# Patient Record
Sex: Male | Born: 1965 | Race: White | Hispanic: No | Marital: Married | Smoking: Current every day smoker
Health system: Southern US, Community
[De-identification: ages and names within clinical notes are randomized; demographics above are authoritative.]

## PROBLEM LIST (undated history)

## (undated) DIAGNOSIS — L309 Dermatitis, unspecified: Secondary | ICD-10-CM

## (undated) DIAGNOSIS — I219 Acute myocardial infarction, unspecified: Secondary | ICD-10-CM

## (undated) DIAGNOSIS — I2510 Atherosclerotic heart disease of native coronary artery without angina pectoris: Secondary | ICD-10-CM

## (undated) DIAGNOSIS — M1990 Unspecified osteoarthritis, unspecified site: Secondary | ICD-10-CM

## (undated) DIAGNOSIS — K529 Noninfective gastroenteritis and colitis, unspecified: Secondary | ICD-10-CM

## (undated) DIAGNOSIS — R809 Proteinuria, unspecified: Secondary | ICD-10-CM

## (undated) DIAGNOSIS — F988 Other specified behavioral and emotional disorders with onset usually occurring in childhood and adolescence: Secondary | ICD-10-CM

## (undated) DIAGNOSIS — T7840XA Allergy, unspecified, initial encounter: Secondary | ICD-10-CM

## (undated) DIAGNOSIS — J439 Emphysema, unspecified: Secondary | ICD-10-CM

## (undated) DIAGNOSIS — K37 Unspecified appendicitis: Secondary | ICD-10-CM

## (undated) DIAGNOSIS — K219 Gastro-esophageal reflux disease without esophagitis: Secondary | ICD-10-CM

## (undated) DIAGNOSIS — K5090 Crohn's disease, unspecified, without complications: Secondary | ICD-10-CM

## (undated) DIAGNOSIS — I10 Essential (primary) hypertension: Secondary | ICD-10-CM

## (undated) DIAGNOSIS — N2889 Other specified disorders of kidney and ureter: Secondary | ICD-10-CM

## (undated) DIAGNOSIS — E785 Hyperlipidemia, unspecified: Secondary | ICD-10-CM

## (undated) DIAGNOSIS — C801 Malignant (primary) neoplasm, unspecified: Secondary | ICD-10-CM

## (undated) HISTORY — DX: Allergy, unspecified, initial encounter: T78.40XA

## (undated) HISTORY — PX: APPENDECTOMY: SHX54

## (undated) HISTORY — DX: Proteinuria, unspecified: R80.9

## (undated) HISTORY — DX: Other specified behavioral and emotional disorders with onset usually occurring in childhood and adolescence: F98.8

## (undated) HISTORY — DX: Hyperlipidemia, unspecified: E78.5

## (undated) HISTORY — DX: Malignant (primary) neoplasm, unspecified: C80.1

## (undated) HISTORY — DX: Crohn's disease, unspecified, without complications: K50.90

## (undated) HISTORY — PX: CORONARY STENT PLACEMENT: SHX1402

## (undated) HISTORY — DX: Unspecified osteoarthritis, unspecified site: M19.90

## (undated) HISTORY — PX: COLON SURGERY: SHX602

## (undated) HISTORY — DX: Essential (primary) hypertension: I10

## (undated) HISTORY — DX: Noninfective gastroenteritis and colitis, unspecified: K52.9

## (undated) HISTORY — DX: Morbid (severe) obesity due to excess calories: E66.01

## (undated) HISTORY — DX: Emphysema, unspecified: J43.9

## (undated) HISTORY — DX: Unspecified appendicitis: K37

## (undated) HISTORY — DX: Atherosclerotic heart disease of native coronary artery without angina pectoris: I25.10

## (undated) HISTORY — DX: Acute myocardial infarction, unspecified: I21.9

## (undated) HISTORY — PX: ELBOW SURGERY: SHX618

---

## 2009-02-07 ENCOUNTER — Emergency Department (HOSPITAL_COMMUNITY): Admission: EM | Admit: 2009-02-07 | Discharge: 2009-02-07 | Payer: Self-pay | Admitting: Emergency Medicine

## 2013-04-11 ENCOUNTER — Other Ambulatory Visit: Payer: Self-pay | Admitting: Physician Assistant

## 2013-04-11 MED ORDER — AMPHETAMINE-DEXTROAMPHETAMINE 20 MG PO TABS: 20.0000 mg | ORAL_TABLET | Freq: Two times a day (BID) | ORAL | Status: DC

## 2013-04-23 ENCOUNTER — Encounter: Payer: Self-pay | Admitting: Internal Medicine

## 2013-04-23 DIAGNOSIS — F988 Other specified behavioral and emotional disorders with onset usually occurring in childhood and adolescence: Secondary | ICD-10-CM | POA: Insufficient documentation

## 2013-04-23 DIAGNOSIS — I2510 Atherosclerotic heart disease of native coronary artery without angina pectoris: Secondary | ICD-10-CM | POA: Insufficient documentation

## 2013-04-23 DIAGNOSIS — I10 Essential (primary) hypertension: Secondary | ICD-10-CM | POA: Insufficient documentation

## 2013-04-23 DIAGNOSIS — E785 Hyperlipidemia, unspecified: Secondary | ICD-10-CM | POA: Insufficient documentation

## 2013-04-25 ENCOUNTER — Ambulatory Visit: Payer: Self-pay | Admitting: Physician Assistant

## 2013-05-16 ENCOUNTER — Other Ambulatory Visit: Payer: Self-pay | Admitting: Physician Assistant

## 2013-05-16 MED ORDER — AMPHETAMINE-DEXTROAMPHETAMINE 20 MG PO TABS: 20.0000 mg | ORAL_TABLET | Freq: Two times a day (BID) | ORAL | Status: DC

## 2013-07-01 ENCOUNTER — Ambulatory Visit (INDEPENDENT_AMBULATORY_CARE_PROVIDER_SITE_OTHER): Payer: Self-pay | Admitting: Emergency Medicine

## 2013-07-01 ENCOUNTER — Encounter: Payer: Self-pay | Admitting: Emergency Medicine

## 2013-07-01 VITALS — BP 114/82 | HR 62 | Temp 98.2°F | Resp 18 | Ht 69.5 in | Wt 228.0 lb

## 2013-07-01 DIAGNOSIS — F988 Other specified behavioral and emotional disorders with onset usually occurring in childhood and adolescence: Secondary | ICD-10-CM

## 2013-07-01 DIAGNOSIS — I10 Essential (primary) hypertension: Secondary | ICD-10-CM

## 2013-07-01 DIAGNOSIS — J329 Chronic sinusitis, unspecified: Secondary | ICD-10-CM

## 2013-07-01 DIAGNOSIS — E782 Mixed hyperlipidemia: Secondary | ICD-10-CM

## 2013-07-01 LAB — CBC WITH DIFFERENTIAL/PLATELET
BASOS ABS: 0 10*3/uL (ref 0.0–0.1)
BASOS PCT: 1 % (ref 0–1)
EOS ABS: 0.3 10*3/uL (ref 0.0–0.7)
Eosinophils Relative: 3 % (ref 0–5)
HCT: 42.5 % (ref 39.0–52.0)
Hemoglobin: 15 g/dL (ref 13.0–17.0)
Lymphocytes Relative: 21 % (ref 12–46)
Lymphs Abs: 1.8 10*3/uL (ref 0.7–4.0)
MCH: 30.8 pg (ref 26.0–34.0)
MCHC: 35.3 g/dL (ref 30.0–36.0)
MCV: 87.3 fL (ref 78.0–100.0)
Monocytes Absolute: 0.6 10*3/uL (ref 0.1–1.0)
Monocytes Relative: 7 % (ref 3–12)
NEUTROS PCT: 68 % (ref 43–77)
Neutro Abs: 6 10*3/uL (ref 1.7–7.7)
PLATELETS: 279 10*3/uL (ref 150–400)
RBC: 4.87 MIL/uL (ref 4.22–5.81)
RDW: 15.1 % (ref 11.5–15.5)
WBC: 8.7 10*3/uL (ref 4.0–10.5)

## 2013-07-01 LAB — COMPREHENSIVE METABOLIC PANEL
ALBUMIN: 3.7 g/dL (ref 3.5–5.2)
ALK PHOS: 108 U/L (ref 39–117)
ALT: 23 U/L (ref 0–53)
AST: 16 U/L (ref 0–37)
BUN: 12 mg/dL (ref 6–23)
CO2: 26 mEq/L (ref 19–32)
Calcium: 9.4 mg/dL (ref 8.4–10.5)
Chloride: 107 mEq/L (ref 96–112)
Creat: 0.9 mg/dL (ref 0.50–1.35)
GLUCOSE: 79 mg/dL (ref 70–99)
POTASSIUM: 4.4 meq/L (ref 3.5–5.3)
SODIUM: 138 meq/L (ref 135–145)
TOTAL PROTEIN: 6.5 g/dL (ref 6.0–8.3)
Total Bilirubin: 0.3 mg/dL (ref 0.2–1.2)

## 2013-07-01 LAB — LIPID PANEL
Cholesterol: 143 mg/dL (ref 0–200)
HDL: 35 mg/dL — ABNORMAL LOW (ref >39)
LDL Cholesterol: 67 mg/dL (ref 0–99)
Total CHOL/HDL Ratio: 4.1 Ratio
Triglycerides: 206 mg/dL — ABNORMAL HIGH (ref <150)
VLDL: 41 mg/dL — ABNORMAL HIGH (ref 0–40)

## 2013-07-01 MED ORDER — METOPROLOL TARTRATE 50 MG PO TABS: 50.0000 mg | ORAL_TABLET | Freq: Two times a day (BID) | ORAL | Status: DC

## 2013-07-01 MED ORDER — SULFAMETHOXAZOLE-TMP DS 800-160 MG PO TABS: 1.0000 | ORAL_TABLET | Freq: Two times a day (BID) | ORAL | Status: DC

## 2013-07-01 MED ORDER — AMPHETAMINE-DEXTROAMPHETAMINE 20 MG PO TABS: 20.0000 mg | ORAL_TABLET | Freq: Two times a day (BID) | ORAL | Status: DC

## 2013-07-01 MED ORDER — PREDNISONE 10 MG PO TABS: ORAL_TABLET | ORAL | Status: DC

## 2013-07-01 MED ORDER — LOSARTAN POTASSIUM 100 MG PO TABS: 100.0000 mg | ORAL_TABLET | Freq: Every day | ORAL | Status: DC

## 2013-07-01 MED ORDER — ATORVASTATIN CALCIUM 80 MG PO TABS: 80.0000 mg | ORAL_TABLET | Freq: Every day | ORAL | Status: DC

## 2013-07-01 NOTE — Progress Notes (Signed)
Subjective:    Patient ID: Cole [name], male    DOB: 10-01-1965, 48 y.o.   MRN: [id number]  HPI Comments: 48 yo male presents for 3 month F/U for HTN, Cholesterol, D. Deficient. 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 He is not exercising routinely. He occasionally check BP and notes it has been good.  He needs Adderall refilled and notes he does not take it every day and Only BID rarely. He nots it helps with focus.  He has been traveling a lot with work and has noticed increased production with sinus with color on/off x 1 month. He has chronic allergy drainage. He denies any OTC relief.  Hyperlipidemia  Hypertension     Medication List       This list is accurate as of: 07/01/13 11:59 PM.  Always use your most recent med list.               amphetamine-dextroamphetamine 20 MG tablet  Commonly known as:  ADDERALL  Take 1 tablet (20 mg total) by mouth 2 (two) times daily.     aspirin 325 MG tablet  Take 325 mg by mouth daily.     atorvastatin 80 MG tablet  Commonly known as:  LIPITOR  Take 1 tablet (80 mg total) by mouth daily.     metoprolol 50 MG tablet  Commonly known as:  LOPRESSOR  Take 1 tablet (50 mg total) by mouth 2 (two) times daily.     predniSONE 10 MG tablet  Commonly known as:  DELTASONE  1 po TID x 3 days, 1 PO BID x 3 days, 1 po QD x 5 days     sulfamethoxazole-trimethoprim 800-160 MG per tablet  Commonly known as:  BACTRIM DS  Take 1 tablet by mouth 2 (two) times daily.       No Known Allergies Past Medical History  Diagnosis Date  . Hyperlipidemia   . Hypertension   . ADD (attention deficit disorder)   . ASHD (arteriosclerotic heart disease)       Review of Systems  HENT: Positive for congestion and sinus pressure.   All other systems reviewed and are negative.   BP 114/82  Pulse 62  Temp(Src) 98.2 F (36.8 C) (Temporal)  Resp 18  Ht 5' 9.5" (1.765 m)  Wt 228 lb (103.42 kg)  BMI 33.20  kg/m2     Objective:   Physical Exam  Nursing note and vitals reviewed. Constitutional: He is oriented to person, place, and time. He appears well-developed and well-nourished.  HENT:  Head: Normocephalic and atraumatic.  Right Ear: External ear normal.  Left Ear: External ear normal.  Nose: Nose normal.  Mouth/Throat: No oropharyngeal exudate.  Yellow TMs bilateral   Eyes: Conjunctivae and EOM are normal.  Neck: Normal range of motion. Neck supple. No JVD present. No thyromegaly present.  Cardiovascular: Normal rate, regular rhythm, normal heart sounds and intact distal pulses.   Pulmonary/Chest: Effort normal and breath sounds normal.  Abdominal: Soft. Bowel sounds are normal. He exhibits no distension and no mass. There is no tenderness. There is no rebound and no guarding.  Musculoskeletal: Normal range of motion. He exhibits no edema and no tenderness.  Lymphadenopathy:    He has no cervical adenopathy.  Neurological: He is alert and oriented to person, place, and time. He has normal reflexes. No cranial nerve deficit. Coordination normal.  Skin: Skin is warm and dry.  Psychiatric: He has a  normal mood and affect. His behavior is normal. Judgment and thought content normal.          Assessment & Plan:  1.  3 month F/U for HTN, Cholesterol, D. Deficient. Needs healthy diet, cardio QD and obtain healthy weight. Check Labs, Check BP if >130/80 call office 2. Sinusitis/ Allergic rhinitis- Allegra OTC, increase H2o, allergy hygiene explained. Needs 4$ RX- Bactrim DS AD, Pred DP 10 mg AD 3. ADD- Refill RX AD

## 2013-07-01 NOTE — Patient Instructions (Signed)
Allergic Rhinitis Allergic rhinitis is when the mucous membranes in the nose respond to allergens. Allergens are particles in the air that cause your body to have an allergic reaction. This causes you to release allergic antibodies. Through a chain of events, these eventually cause you to release histamine into the blood stream. Although meant to protect the body, it is this release of histamine that causes your discomfort, such as frequent sneezing, congestion, and an itchy, runny nose.  CAUSES  Seasonal allergic rhinitis (hay fever) is caused by pollen allergens that may come from grasses, trees, and weeds. Year-round allergic rhinitis (perennial allergic rhinitis) is caused by allergens such as house dust mites, pet dander, and mold spores.  SYMPTOMS   Nasal stuffiness (congestion).  Itchy, runny nose with sneezing and tearing of the eyes. DIAGNOSIS  Your health care provider can help you determine the allergen or allergens that trigger your symptoms. If you and your health care provider are unable to determine the allergen, skin or blood testing may be used. TREATMENT  Allergic Rhinitis does not have a cure, but it can be controlled by:  Medicines and allergy shots (immunotherapy).  Avoiding the allergen. Hay fever may often be treated with antihistamines in pill or nasal spray forms. Antihistamines block the effects of histamine. There are over-the-counter medicines that may help with nasal congestion and swelling around the eyes. Check with your health care provider before taking or giving this medicine.  If avoiding the allergen or the medicine prescribed do not work, there are many new medicines your health care provider can prescribe. Stronger medicine may be used if initial measures are ineffective. Desensitizing injections can be used if medicine and avoidance does not work. Desensitization is when a patient is given ongoing shots until the body becomes less sensitive to the allergen.  Make sure you follow up with your health care provider if problems continue. HOME CARE INSTRUCTIONS It is not possible to completely avoid allergens, but you can reduce your symptoms by taking steps to limit your exposure to them. It helps to know exactly what you are allergic to so that you can avoid your specific triggers. SEEK MEDICAL CARE IF:   You have a fever.  You develop a cough that does not stop easily (persistent).  You have shortness of breath.  You start wheezing.  Symptoms interfere with normal daily activities. Document Released: 01/28/2001 Document Revised: 02/23/2013 Document Reviewed: 01/10/2013 [employer] Patient Information 2014 [employer].  

## 2013-07-25 ENCOUNTER — Ambulatory Visit: Payer: Self-pay | Admitting: Physician Assistant

## 2013-08-03 ENCOUNTER — Other Ambulatory Visit: Payer: Self-pay | Admitting: Emergency Medicine

## 2013-08-03 MED ORDER — AMPHETAMINE-DEXTROAMPHETAMINE 20 MG PO TABS: 20.0000 mg | ORAL_TABLET | Freq: Two times a day (BID) | ORAL | Status: DC

## 2013-09-07 ENCOUNTER — Other Ambulatory Visit: Payer: Self-pay | Admitting: Emergency Medicine

## 2013-09-07 MED ORDER — AMPHETAMINE-DEXTROAMPHETAMINE 20 MG PO TABS: 20.0000 mg | ORAL_TABLET | Freq: Two times a day (BID) | ORAL | Status: DC

## 2013-10-03 ENCOUNTER — Ambulatory Visit: Payer: Self-pay | Admitting: Physician Assistant

## 2013-10-24 ENCOUNTER — Encounter: Payer: Self-pay | Admitting: Physician Assistant

## 2013-10-24 ENCOUNTER — Ambulatory Visit (INDEPENDENT_AMBULATORY_CARE_PROVIDER_SITE_OTHER): Payer: Federal, State, Local not specified - PPO | Admitting: Physician Assistant

## 2013-10-24 ENCOUNTER — Ambulatory Visit (HOSPITAL_COMMUNITY)
Admission: RE | Admit: 2013-10-24 | Discharge: 2013-10-24 | Disposition: A | Discharge status: Home / Self Care | Payer: Federal, State, Local not specified - PPO | Source: Ambulatory Visit | Attending: Physician Assistant | Admitting: Physician Assistant

## 2013-10-24 VITALS — BP 130/72 | HR 60 | Temp 98.1°F | Resp 16 | Ht 69.5 in | Wt 233.0 lb

## 2013-10-24 DIAGNOSIS — E785 Hyperlipidemia, unspecified: Secondary | ICD-10-CM

## 2013-10-24 DIAGNOSIS — M791 Myalgia, unspecified site: Secondary | ICD-10-CM

## 2013-10-24 DIAGNOSIS — R05 Cough: Secondary | ICD-10-CM | POA: Insufficient documentation

## 2013-10-24 DIAGNOSIS — R062 Wheezing: Secondary | ICD-10-CM | POA: Insufficient documentation

## 2013-10-24 DIAGNOSIS — I10 Essential (primary) hypertension: Secondary | ICD-10-CM

## 2013-10-24 DIAGNOSIS — R809 Proteinuria, unspecified: Secondary | ICD-10-CM

## 2013-10-24 DIAGNOSIS — F17200 Nicotine dependence, unspecified, uncomplicated: Secondary | ICD-10-CM | POA: Insufficient documentation

## 2013-10-24 DIAGNOSIS — Z79899 Other long term (current) drug therapy: Secondary | ICD-10-CM

## 2013-10-24 DIAGNOSIS — R197 Diarrhea, unspecified: Secondary | ICD-10-CM

## 2013-10-24 DIAGNOSIS — R059 Cough, unspecified: Secondary | ICD-10-CM | POA: Insufficient documentation

## 2013-10-24 DIAGNOSIS — IMO0001 Reserved for inherently not codable concepts without codable children: Secondary | ICD-10-CM

## 2013-10-24 LAB — MICROALBUMIN / CREATININE URINE RATIO
CREATININE, URINE: 160.6 mg/dL
MICROALB/CREAT RATIO: 139.7 mg/g — AB (ref 0.0–30.0)
Microalb, Ur: 22.44 mg/dL — ABNORMAL HIGH (ref 0.00–1.89)

## 2013-10-24 LAB — CBC WITH DIFFERENTIAL/PLATELET
Basophils Absolute: 0.1 10*3/uL (ref 0.0–0.1)
Basophils Relative: 1 % (ref 0–1)
Eosinophils Absolute: 0.3 10*3/uL (ref 0.0–0.7)
Eosinophils Relative: 3 % (ref 0–5)
HEMATOCRIT: 42.3 % (ref 39.0–52.0)
Hemoglobin: 14.7 g/dL (ref 13.0–17.0)
LYMPHS PCT: 19 % (ref 12–46)
Lymphs Abs: 1.7 10*3/uL (ref 0.7–4.0)
MCH: 30.2 pg (ref 26.0–34.0)
MCHC: 34.8 g/dL (ref 30.0–36.0)
MCV: 87 fL (ref 78.0–100.0)
MONO ABS: 0.6 10*3/uL (ref 0.1–1.0)
Monocytes Relative: 7 % (ref 3–12)
NEUTROS PCT: 70 % (ref 43–77)
Neutro Abs: 6.2 10*3/uL (ref 1.7–7.7)
Platelets: 290 10*3/uL (ref 150–400)
RBC: 4.86 MIL/uL (ref 4.22–5.81)
RDW: 14.2 % (ref 11.5–15.5)
WBC: 8.9 10*3/uL (ref 4.0–10.5)

## 2013-10-24 LAB — HEPATIC FUNCTION PANEL
ALK PHOS: 120 U/L — AB (ref 39–117)
ALT: 29 U/L (ref 0–53)
AST: 20 U/L (ref 0–37)
Albumin: 4.1 g/dL (ref 3.5–5.2)
BILIRUBIN DIRECT: 0.1 mg/dL (ref 0.0–0.3)
Indirect Bilirubin: 0.3 mg/dL (ref 0.2–1.2)
TOTAL PROTEIN: 7.3 g/dL (ref 6.0–8.3)
Total Bilirubin: 0.4 mg/dL (ref 0.2–1.2)

## 2013-10-24 LAB — BASIC METABOLIC PANEL WITH GFR
BUN: 18 mg/dL (ref 6–23)
CHLORIDE: 105 meq/L (ref 96–112)
CO2: 21 mEq/L (ref 19–32)
Calcium: 9.1 mg/dL (ref 8.4–10.5)
Creat: 0.71 mg/dL (ref 0.50–1.35)
GFR, Est African American: >89 mL/min
GFR, Est Non African American: >89 mL/min
GLUCOSE: 85 mg/dL (ref 70–99)
Potassium: 4.5 mEq/L (ref 3.5–5.3)
SODIUM: 136 meq/L (ref 135–145)

## 2013-10-24 LAB — LIPID PANEL
Cholesterol: 120 mg/dL (ref 0–200)
HDL: 39 mg/dL — AB (ref >39)
LDL Cholesterol: 60 mg/dL (ref 0–99)
Total CHOL/HDL Ratio: 3.1 Ratio
Triglycerides: 104 mg/dL (ref <150)
VLDL: 21 mg/dL (ref 0–40)

## 2013-10-24 LAB — URINALYSIS, ROUTINE W REFLEX MICROSCOPIC
BILIRUBIN URINE: NEGATIVE
Glucose, UA: NEGATIVE mg/dL
HGB URINE DIPSTICK: NEGATIVE
KETONES UR: NEGATIVE mg/dL
Leukocytes, UA: NEGATIVE
NITRITE: NEGATIVE
PH: 6 (ref 5.0–8.0)
Protein, ur: 30 mg/dL — AB
Specific Gravity, Urine: 1.026 (ref 1.005–1.030)
Urobilinogen, UA: 0.2 mg/dL (ref 0.0–1.0)

## 2013-10-24 LAB — URINALYSIS, MICROSCOPIC ONLY
BACTERIA UA: NONE SEEN
Casts: NONE SEEN
Crystals: NONE SEEN
Squamous Epithelial / LPF: NONE SEEN

## 2013-10-24 LAB — HEMOGLOBIN A1C
Hgb A1c MFr Bld: 5.5 % (ref <5.7)
Mean Plasma Glucose: 111 mg/dL (ref <117)

## 2013-10-24 LAB — MAGNESIUM: MAGNESIUM: 2.1 mg/dL (ref 1.5–2.5)

## 2013-10-24 LAB — TSH: TSH: 2.219 u[IU]/mL (ref 0.350–4.500)

## 2013-10-24 MED ORDER — AMPHETAMINE-DEXTROAMPHETAMINE 20 MG PO TABS: 20.0000 mg | ORAL_TABLET | Freq: Two times a day (BID) | ORAL | Status: DC

## 2013-10-24 MED ORDER — CYCLOBENZAPRINE HCL 10 MG PO TABS: 10.0000 mg | ORAL_TABLET | Freq: Three times a day (TID) | ORAL | Status: DC | PRN

## 2013-10-24 MED ORDER — AZITHROMYCIN 250 MG PO TABS: ORAL_TABLET | ORAL | Status: AC

## 2013-10-24 NOTE — Progress Notes (Signed)
Assessment and Plan:  Hypertension: Continue medication, monitor blood pressure at home. Continue DASH diet. History of microalbuminuria recheck this, not on ACE/ARB Diarrhea- discussed diet, can take imodium, due to colonoscopy will send sooner.  Cholesterol: Continue diet and exercise. Check cholesterol.  Pre-diabetes-Continue diet and exercise. Check A1C Vitamin D Def- check level and continue medications.  ADD-  Continue ADD medication, helps with focus, no AE's. The patient was counseled on the addictive nature of the medication and was encouraged to take drug holidays when not needed.  Wheezing- get CXR, zpak, smoking cessation counseled.  Smoking cessation- discussed, patient not ready at this time.   Continue diet and meds as discussed. Further disposition pending results of labs. OVER 40 minutes of exam, counseling, chart review, referral performed   HPI 48 y.o. male  presents for 3 month follow up with hypertension, hyperlipidemia, prediabetes and vitamin D. 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 He denies chest pain, shortness of breath, dizziness.  He is on cholesterol medication and denies myalgias. His cholesterol is at goal. The cholesterol last visit was:   Lab Results  Component Value Date   CHOL 143 07/01/2013   HDL 35* 07/01/2013   LDLCALC 67 07/01/2013   TRIG 206* 07/01/2013   CHOLHDL 4.1 07/01/2013   Patient is on Vitamin D supplement.   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  He has been having diarrhea for last several months, states it is worse on the road. He states he has diarrhea daily, occ 3-4 times a day, denies AB cramping, fever, chills, melana, BRBPR.He has not had a colonoscopy, no family history of colon cancer.  He has been on losartan and norvasc in the past but he got off of them due to low BP and feeling bad, he has  microalbuminuria but it was right after the hospital for dehydration, will recheck today.  He states he has occ muscle spasms, has taken his wife's flexeril and it has helped.  Patient is on an ADD medication, he states that the medication is helping and he denies any AE's.  Current Medications:  Current Outpatient Prescriptions on File Prior to Visit  Medication Sig Dispense Refill  . amphetamine-dextroamphetamine (ADDERALL) 20 MG tablet Take 1 tablet (20 mg total) by mouth 2 (two) times daily.  60 tablet  0  . aspirin 325 MG tablet Take 325 mg by mouth daily.      Marland Kitchen atorvastatin (LIPITOR) 80 MG tablet Take 1 tablet (80 mg total) by mouth daily.  90 tablet  1  . metoprolol (LOPRESSOR) 50 MG tablet Take 1 tablet (50 mg total) by mouth 2 (two) times daily.  180 tablet  1   No current facility-administered medications on file prior to visit.   Medical History:  Past Medical History  Diagnosis Date  . Hyperlipidemia   . Hypertension   . ADD (attention deficit disorder)   . ASHD (arteriosclerotic heart disease)    Allergies: No Known Allergies   Review of Systems: [X]  = complains of  [ ]  = denies  General: Fatigue Valu.Nieves ] Fever [ ]  Chills [ ]  Weakness [ ]   Insomnia [ ]  Eyes: Redness [ ]  Blurred vision [ ]  Diplopia [ ]   ENT: Congestion [ ]  Sinus Pain [ ]  Post Nasal Drip [ ]  Sore Throat [ ]  Earache [ ]   Cardiac: Chest pain/pressure [ ]  SOB [ ]  Orthopnea [ ]   Palpitations [ ]   Paroxysmal nocturnal dyspnea[ ]  Claudication [ ]  Edema [ ]   Pulmonary: Cough [ ]  Wheezing[X ]  SOB [ ]   Snoring [ ]   GI: Nausea [ ]  Vomiting[ ]  Dysphagia[ ]  Heartburn[ ]  Abdominal pain [ ]  Constipation [ ] ; Diarrhea Valu.Nieves ]; BRBPR [ ]  Melena[ ]  GU: Hematuria[ ]  Dysuria [ ]  Nocturia[ ]  Urgency [ ]   Hesitancy [ ]  Discharge [ ]  Neuro: Headaches[ ]  Vertigo[ ]  Paresthesias[ ]  Spasm [ ]  Speech changes [ ]  Incoordination [ ]   Ortho: Arthritis [ ]  Joint pain [ ]  Muscle pain Valu.Nieves ] Joint swelling [ ]  Back Pain [ ]  Skin:  Rash [ ]    Pruritis [ ]  Change in skin lesion [ ]   Psych: Depression[ ]  Anxiety[ ]  Confusion [ ]  Memory loss [ ]   Heme/Lypmh: Bleeding [ ]  Bruising [ ]  Enlarged lymph nodes [ ]   Endocrine: Visual blurring [ ]  Paresthesia [ ]  Polyuria [ ]  Polydypsea [ ]    Heat/cold intolerance [ ]  Hypoglycemia [ ]   Family history- Review and unchanged Social history- Review and unchanged Physical Exam: BP 130/72  Pulse 60  Temp(Src) 98.1 F (36.7 C)  Resp 16  Ht 5' 9.5" (1.765 m)  Wt 233 lb (105.688 kg)  BMI 33.93 kg/m2 Wt Readings from Last 3 Encounters:  10/24/13 233 lb (105.688 kg)  07/01/13 228 lb (103.42 kg)   General Appearance: Well nourished, in no apparent distress. Eyes: PERRLA, EOMs, conjunctiva no swelling or erythema Sinuses: No Frontal/maxillary tenderness ENT/Mouth: Ext aud canals clear, TMs without erythema, bulging. No erythema, swelling, or exudate on post pharynx.  Tonsils not swollen or erythematous. Hearing normal.  Neck: Supple, thyroid normal.  Respiratory: Respiratory effort normal, right lower lobe wheezing without rales, rhonchi, or stridor.  Cardio: RRR with no MRGs. Brisk peripheral pulses without edema.  Abdomen: Soft, + BS, obese  Non tender, no guarding, rebound, hernias, masses. Lymphatics: Non tender without lymphadenopathy.  Musculoskeletal: Full ROM, 5/5 strength, normal gait.  Skin: Warm, dry without rashes, lesions, ecchymosis.  Neuro: Cranial nerves intact. Normal muscle tone, no cerebellar symptoms. Sensation intact.  Psych: Awake and oriented X 3, normal affect, Insight and Judgment appropriate.    Vicie Mutters 9:27 AM

## 2013-10-24 NOTE — Addendum Note (Signed)
Addended by: Vicie Mutters R on: 10/24/2013 05:17 PM   Modules accepted: Orders

## 2013-10-24 NOTE — Patient Instructions (Signed)
We are giving you chantix for smoking cessation. You can do it! And we are here to help! You may have heard some scary side effects about chantix, the three most common I hear about are nausea, crazy dreams and depression.  However, I like for my patients to try to stay on 1/2 a tablet twice a day rather than one tablet twice a day as normally prescribed. This helps decrease the chances of side effects and helps save money by making a one month prescription last two months  Please start the prescription this way:  Start 1/2 tablet by mouth once daily after food with a full glass of water for 3 days Then do 1/2 tablet by mouth twice daily for 4 days.  At this point we have several options: 1) continue on 1/2 tablet twice a day- which I encourage you to do. You can stay on this dose the rest of the time on the medication or if you still feel the need to smoke you can do one of the two options below. 2) do one tablet in the morning and 1/2 in the evening which helps decrease dreams. 3) do one tablet twice a day.   What if I miss a dose? If you miss a dose, take it as soon as you can. If it is almost time for your next dose, take only that dose. Do not take double or extra doses.  What should I watch for while using this medicine? Visit your doctor or health care professional for regular check ups. Ask for ongoing advice and encouragement from your doctor or healthcare professional, friends, and family to help you quit. If you smoke while on this medication, quit again  Your mouth may get dry. Chewing sugarless gum or hard candy, and drinking plenty of water may help. Contact your doctor if the problem does not go away or is severe.  You may get drowsy or dizzy. Do not drive, use machinery, or do anything that needs mental alertness until you know how this medicine affects you. Do not stand or sit up quickly, especially if you are an older patient.   The use of this medicine may increase the chance  of suicidal thoughts or actions. Pay special attention to how you are responding while on this medicine. Any worsening of mood, or thoughts of suicide or dying should be reported to your health care professional right away.  ADVANTAGES OF QUITTING SMOKING  Within 20 minutes, blood pressure decreases. Your pulse is at normal level.  After 8 hours, carbon monoxide levels in the blood return to normal. Your oxygen level increases.  After 24 hours, the chance of having a heart attack starts to decrease. Your breath, hair, and body stop smelling like smoke.  After 48 hours, damaged nerve endings begin to recover. Your sense of taste and smell improve.  After 72 hours, the body is virtually free of nicotine. Your bronchial tubes relax and breathing becomes easier.  After 2 to 12 weeks, lungs can hold more air. Exercise becomes easier and circulation improves.  After 1 year, the risk of coronary heart disease is cut in half.  After 5 years, the risk of stroke falls to the same as a nonsmoker.  After 10 years, the risk of lung cancer is cut in half and the risk of other cancers decreases significantly.  After 15 years, the risk of coronary heart disease drops, usually to the level of a nonsmoker.  You will have extra money  to spend on things other than cigarettes.    Bad carbs also include fruit juice, alcohol, and sweet tea. These are empty calories that do not signal to your brain that you are full.   Please remember the good carbs are still carbs which convert into sugar. So please measure them out no more than 1/2-1 cup of rice, oatmeal, pasta, and beans.  Veggies are however free foods! Pile them on.   I like lean protein at every meal such as chicken, [place], pork chops, cottage cheese, etc. Just do not fry these meats and please center your meal around vegetable, the meats should be a side dish.   No all fruit is created equal. Please see the list below, the fruit at the bottom is  higher in sugars than the fruit at the top

## 2013-10-25 ENCOUNTER — Encounter: Payer: Self-pay | Admitting: Internal Medicine

## 2013-11-30 ENCOUNTER — Other Ambulatory Visit: Payer: Self-pay | Admitting: Emergency Medicine

## 2013-11-30 MED ORDER — AMPHETAMINE-DEXTROAMPHETAMINE 20 MG PO TABS: 20.0000 mg | ORAL_TABLET | Freq: Two times a day (BID) | ORAL | Status: DC

## 2013-12-29 ENCOUNTER — Ambulatory Visit: Payer: Self-pay | Admitting: Internal Medicine

## 2014-01-09 ENCOUNTER — Encounter: Payer: Self-pay | Admitting: Internal Medicine

## 2014-01-09 ENCOUNTER — Ambulatory Visit (INDEPENDENT_AMBULATORY_CARE_PROVIDER_SITE_OTHER): Payer: Federal, State, Local not specified - PPO | Admitting: Internal Medicine

## 2014-01-09 VITALS — BP 120/86 | HR 72 | Ht 69.5 in | Wt 236.4 lb

## 2014-01-09 DIAGNOSIS — R197 Diarrhea, unspecified: Secondary | ICD-10-CM

## 2014-01-09 DIAGNOSIS — Z1211 Encounter for screening for malignant neoplasm of colon: Secondary | ICD-10-CM

## 2014-01-09 MED ORDER — MOVIPREP 100 G PO SOLR: 1.0000 | Freq: Once | ORAL | Status: DC

## 2014-01-09 NOTE — Patient Instructions (Signed)
You have been scheduled for a colonoscopy. Please follow written instructions given to you at your visit today.  Please pick up your prep kit at the pharmacy within the next 1-3 days. If you use inhalers (even only as needed), please bring them with you on the day of your procedure. Your physician has requested that you go to www.startemmi.com and enter the access code given to you at your visit today. This web site gives a general overview about your procedure. However, you should still follow specific instructions given to you by our office regarding your preparation for the procedure. CC:  Unk Pinto MD

## 2014-01-09 NOTE — Progress Notes (Signed)
HISTORY OF PRESENT ILLNESS:  Cole [name] is a 48 y.o. male with past medical history as outlined below, including coronary artery disease, hypertension, and hyperlipidemia He is referred today by his primary care provider regarding chronic diarrhea and the need for colonoscopy. He is accompanied by his wife. Patient reports having had complicated appendicitis approximately 3 years ago which resulted in resection of some portion of his colon (no details, done elsewhere). Current history is that of greater than one year of diarrhea. Often postprandially. He has had no workup. No incontinence. He does state that since having his teeth pulled and being fitted for dentures 2 months ago, his problems with diarrhea have essentially resolved. He now reports one to 2 formed bowel movements per day. Only has loose stools when consuming dairy products. GI review of systems is otherwise negative. No new medications. No family history of gastrointestinal disorders of relevance  REVIEW OF SYSTEMS:  All non-GI ROS negative except for sinus and allergy trouble  Past Medical History  Diagnosis Date  . Hyperlipidemia   . Hypertension   . ADD (attention deficit disorder)   . ASHD (arteriosclerotic heart disease)   . Morbid obesity   . Positive for macroalbuminuria     Past Surgical History  Procedure Laterality Date  . Appendectomy    . Colon surgery    . Coronary stent placement      Social History Cole [name]  reports that he has been smoking.  He has never used smokeless tobacco. He reports that he does not drink alcohol or use illicit drugs.  family history includes Diabetes in his maternal grandmother.  No Known Allergies     PHYSICAL EXAMINATION: Vital signs: BP 120/86  Pulse 72  Ht 5' 9.5" (1.765 m)  Wt 236 lb 6.4 oz (107.23 kg)  BMI 34.42 kg/m2  Constitutional: generally well-appearing, no acute distress Psychiatric: alert and oriented x3, cooperative Eyes: extraocular movements  intact, anicteric, conjunctiva pink Mouth: oral pharynx moist, no lesions Neck: supple no lymphadenopathy Cardiovascular: heart regular rate and rhythm, no murmur Lungs: clear to auscultation bilaterally Abdomen: soft, nontender, nondistended, no obvious ascites, no peritoneal signs, normal bowel sounds, no organomegaly Rectal: Deferred until colonoscopy Extremities: no lower extremity edema bilaterally Skin: no lesions on visible extremities Neuro: No focal deficits.   ASSESSMENT:  #1. Chronic diarrhea. Seemingly better over the past few months. May be related to previous intestinal surgery. #2. Colon cancer screening. Baseline risk. Appropriate candidate without contraindication   PLAN:  #1. Screening colonoscopy. We can evaluate his issues with diarrhea at the same time. Possible random biopsies.The nature of the procedure, as well as the risks, benefits, and alternatives were carefully and thoroughly reviewed with the patient. Ample time for discussion and questions allowed. The patient understood, was satisfied, and agreed to proceed. Movi prep prescribed. Patient instructed on its use.

## 2014-01-18 ENCOUNTER — Other Ambulatory Visit: Payer: Self-pay | Admitting: Physician Assistant

## 2014-01-18 MED ORDER — AMPHETAMINE-DEXTROAMPHETAMINE 20 MG PO TABS: 20.0000 mg | ORAL_TABLET | Freq: Two times a day (BID) | ORAL | Status: DC

## 2014-01-24 ENCOUNTER — Encounter: Payer: Self-pay | Admitting: Internal Medicine

## 2014-02-13 ENCOUNTER — Ambulatory Visit (INDEPENDENT_AMBULATORY_CARE_PROVIDER_SITE_OTHER): Payer: Federal, State, Local not specified - PPO | Admitting: Physician Assistant

## 2014-02-13 ENCOUNTER — Encounter: Payer: Self-pay | Admitting: Physician Assistant

## 2014-02-13 VITALS — BP 122/78 | HR 60 | Temp 97.9°F | Resp 16 | Ht 69.5 in | Wt 241.0 lb

## 2014-02-13 DIAGNOSIS — E559 Vitamin D deficiency, unspecified: Secondary | ICD-10-CM

## 2014-02-13 DIAGNOSIS — F988 Other specified behavioral and emotional disorders with onset usually occurring in childhood and adolescence: Secondary | ICD-10-CM

## 2014-02-13 DIAGNOSIS — E669 Obesity, unspecified: Secondary | ICD-10-CM | POA: Insufficient documentation

## 2014-02-13 DIAGNOSIS — Z79899 Other long term (current) drug therapy: Secondary | ICD-10-CM

## 2014-02-13 DIAGNOSIS — R7309 Other abnormal glucose: Secondary | ICD-10-CM

## 2014-02-13 DIAGNOSIS — I10 Essential (primary) hypertension: Secondary | ICD-10-CM

## 2014-02-13 DIAGNOSIS — F17200 Nicotine dependence, unspecified, uncomplicated: Secondary | ICD-10-CM

## 2014-02-13 DIAGNOSIS — E785 Hyperlipidemia, unspecified: Secondary | ICD-10-CM

## 2014-02-13 DIAGNOSIS — R7303 Prediabetes: Secondary | ICD-10-CM

## 2014-02-13 DIAGNOSIS — I2510 Atherosclerotic heart disease of native coronary artery without angina pectoris: Secondary | ICD-10-CM

## 2014-02-13 LAB — LIPID PANEL
CHOL/HDL RATIO: 3 ratio
Cholesterol: 109 mg/dL (ref 0–200)
HDL: 36 mg/dL — AB (ref >39)
LDL CALC: 45 mg/dL (ref 0–99)
TRIGLYCERIDES: 142 mg/dL (ref <150)
VLDL: 28 mg/dL (ref 0–40)

## 2014-02-13 LAB — CBC WITH DIFFERENTIAL/PLATELET
BASOS ABS: 0.1 10*3/uL (ref 0.0–0.1)
Basophils Relative: 1 % (ref 0–1)
Eosinophils Absolute: 0.2 10*3/uL (ref 0.0–0.7)
Eosinophils Relative: 3 % (ref 0–5)
HCT: 41.7 % (ref 39.0–52.0)
Hemoglobin: 14.1 g/dL (ref 13.0–17.0)
LYMPHS PCT: 22 % (ref 12–46)
Lymphs Abs: 1.6 10*3/uL (ref 0.7–4.0)
MCH: 30.4 pg (ref 26.0–34.0)
MCHC: 33.8 g/dL (ref 30.0–36.0)
MCV: 89.9 fL (ref 78.0–100.0)
Monocytes Absolute: 0.5 10*3/uL (ref 0.1–1.0)
Monocytes Relative: 7 % (ref 3–12)
Neutro Abs: 4.8 10*3/uL (ref 1.7–7.7)
Neutrophils Relative %: 67 % (ref 43–77)
PLATELETS: 262 10*3/uL (ref 150–400)
RBC: 4.64 MIL/uL (ref 4.22–5.81)
RDW: 14.5 % (ref 11.5–15.5)
WBC: 7.2 10*3/uL (ref 4.0–10.5)

## 2014-02-13 LAB — BASIC METABOLIC PANEL WITH GFR
BUN: 15 mg/dL (ref 6–23)
CO2: 25 mEq/L (ref 19–32)
CREATININE: 0.99 mg/dL (ref 0.50–1.35)
Calcium: 9.1 mg/dL (ref 8.4–10.5)
Chloride: 106 mEq/L (ref 96–112)
GFR, EST NON AFRICAN AMERICAN: 89 mL/min
GFR, Est African American: >89 mL/min
Glucose, Bld: 90 mg/dL (ref 70–99)
POTASSIUM: 4.2 meq/L (ref 3.5–5.3)
Sodium: 141 mEq/L (ref 135–145)

## 2014-02-13 LAB — HEPATIC FUNCTION PANEL
ALBUMIN: 3.9 g/dL (ref 3.5–5.2)
ALT: 29 U/L (ref 0–53)
AST: 17 U/L (ref 0–37)
Alkaline Phosphatase: 108 U/L (ref 39–117)
BILIRUBIN TOTAL: 0.4 mg/dL (ref 0.2–1.2)
Bilirubin, Direct: 0.1 mg/dL (ref 0.0–0.3)
Indirect Bilirubin: 0.3 mg/dL (ref 0.2–1.2)
Total Protein: 6.5 g/dL (ref 6.0–8.3)

## 2014-02-13 LAB — MAGNESIUM: Magnesium: 1.8 mg/dL (ref 1.5–2.5)

## 2014-02-13 LAB — HEMOGLOBIN A1C
Hgb A1c MFr Bld: 5.7 % — ABNORMAL HIGH (ref <5.7)
Mean Plasma Glucose: 117 mg/dL — ABNORMAL HIGH (ref <117)

## 2014-02-13 LAB — TSH: TSH: 1.312 u[IU]/mL (ref 0.350–4.500)

## 2014-02-13 MED ORDER — CYCLOBENZAPRINE HCL 10 MG PO TABS: 10.0000 mg | ORAL_TABLET | Freq: Three times a day (TID) | ORAL | Status: DC | PRN

## 2014-02-13 MED ORDER — AMPHETAMINE-DEXTROAMPHETAMINE 20 MG PO TABS: 20.0000 mg | ORAL_TABLET | Freq: Two times a day (BID) | ORAL | Status: DC

## 2014-02-13 MED ORDER — METOPROLOL TARTRATE 50 MG PO TABS: 50.0000 mg | ORAL_TABLET | Freq: Two times a day (BID) | ORAL | Status: DC

## 2014-02-13 MED ORDER — ATORVASTATIN CALCIUM 80 MG PO TABS: 80.0000 mg | ORAL_TABLET | Freq: Every day | ORAL | Status: DC

## 2014-02-13 NOTE — Progress Notes (Signed)
Assessment and Plan:  Hypertension: Continue medication, monitor blood pressure at home. Continue DASH diet. Cholesterol: Continue diet and exercise. Check cholesterol.  Vitamin D Def- check level and continue medications.  ADD-  Continue ADD medication, helps with focus, no AE's. The patient was counseled on the addictive nature of the medication and was encouraged to take drug holidays when not needed.  Obesity with co morbidities- long discussion about weight loss, diet, and exercise ASHD- control HTN, Chol, decrease weight.  Smoking cessation- discussed with patient, understands risk of MI, stroke, cancer, and death.    Continue diet and meds as discussed. Further disposition pending results of labs.  HPI 48 y.o. male  presents for 3 month follow up with hypertension, hyperlipidemia, prediabetes and vitamin D. His blood pressure has been controlled at home, today their BP is BP: 122/78 mmHg He does not workout. He denies chest pain, shortness of breath, dizziness.  He is on cholesterol medication and denies myalgias. His cholesterol is at goal. The cholesterol last visit was:   Lab Results  Component Value Date   CHOL 120 10/24/2013   HDL 39* 10/24/2013   LDLCALC 60 10/24/2013   TRIG 104 10/24/2013   CHOLHDL 3.1 10/24/2013   Patient has morbid obesity, Body mass index is 35.09 kg/(m^2).  He is still drinking sweet tea/sodas.  Last A1C in the office was:  Lab Results  Component Value Date   HGBA1C 5.5 10/24/2013   Patient is on Vitamin D supplement.   Patient is on an ADD medication, he states that the medication is helping and he denies any adverse reactions.  Will have colonoscopy next week.  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    Current Medications:  Current Outpatient Prescriptions on File Prior to Visit  Medication Sig Dispense Refill  . amphetamine-dextroamphetamine (ADDERALL) 20 MG tablet Take 1 tablet (20 mg total) by mouth 2 (two) times daily.  60  tablet  0  . aspirin 325 MG tablet Take 325 mg by mouth daily.      Marland Kitchen atorvastatin (LIPITOR) 80 MG tablet Take 1 tablet (80 mg total) by mouth daily.  90 tablet  1  . cetirizine (ZYRTEC) 10 MG tablet Take 10 mg by mouth daily.      . cyclobenzaprine (FLEXERIL) 10 MG tablet Take 1 tablet (10 mg total) by mouth every 8 (eight) hours as needed for muscle spasms.  60 tablet  1  . metoprolol (LOPRESSOR) 50 MG tablet Take 1 tablet (50 mg total) by mouth 2 (two) times daily.  180 tablet  1  . Multiple Vitamins-Minerals (OSTEO COMPLEX PO) Take 1 capsule by mouth daily.       No current facility-administered medications on file prior to visit.   Medical History:  Past Medical History  Diagnosis Date  . Hyperlipidemia   . Hypertension   . ADD (attention deficit disorder)   . ASHD (arteriosclerotic heart disease)   . Morbid obesity   . Positive for macroalbuminuria    Allergies: No Known Allergies   Review of Systems: [X]  = complains of  [ ]  = denies  General: Fatigue [ ]  Fever [ ]  Chills [ ]  Weakness [ ]   Insomnia [ ]  Eyes: Redness [ ]  Blurred vision [ ]  Diplopia [ ]   ENT: Congestion [ ]  Sinus Pain [ ]  Post Nasal Drip [ ]  Sore Throat [ ]  Earache [ ]   Cardiac: Chest pain/pressure [ ]  SOB [ ]   Orthopnea [ ]   Palpitations [ ]   Paroxysmal nocturnal dyspnea[ ]  Claudication [ ]  Edema [ ]   Pulmonary: Cough [ ]  Wheezing[ ]   SOB [ ]   Snoring [ ]   GI: Nausea [ ]  Vomiting[ ]  Dysphagia[ ]  Heartburn[ ]  Abdominal pain [ ]  Constipation [ ] ; Diarrhea [ ] ; BRBPR [ ]  Melena[ ]  GU: Hematuria[ ]  Dysuria [ ]  Nocturia[ ]  Urgency [ ]   Hesitancy [ ]  Discharge [ ]  Neuro: Headaches[ ]  Vertigo[ ]  Paresthesias[ ]  Spasm [ ]  Speech changes [ ]  Incoordination [ ]   Ortho: Arthritis [ ]  Joint pain [ ]  Muscle pain [ ]  Joint swelling [ ]  Back Pain [ ]  Skin:  Rash [ ]   Pruritis [ ]  Change in skin lesion [ ]   Psych: Depression[ ]  Anxiety[ ]  Confusion [ ]  Memory loss [ ]   Heme/Lypmh: Bleeding [ ]  Bruising [ ]  Enlarged lymph nodes  [ ]   Endocrine: Visual blurring [ ]  Paresthesia [ ]  Polyuria [ ]  Polydypsea [ ]    Heat/cold intolerance [ ]  Hypoglycemia [ ]   Family history- Review and unchanged Social history- Review and unchanged Physical Exam: BP 122/78  Pulse 60  Temp(Src) 97.9 F (36.6 C)  Resp 16  Ht 5' 9.5" (1.765 m)  Wt 241 lb (109.317 kg)  BMI 35.09 kg/m2 Wt Readings from Last 3 Encounters:  02/13/14 241 lb (109.317 kg)  01/09/14 236 lb 6.4 oz (107.23 kg)  10/24/13 233 lb (105.688 kg)   General Appearance: Well nourished, in no apparent distress. Eyes: PERRLA, EOMs, conjunctiva no swelling or erythema Sinuses: No Frontal/maxillary tenderness ENT/Mouth: Ext aud canals clear, TMs without erythema, bulging. No erythema, swelling, or exudate on post pharynx.  Tonsils not swollen or erythematous. Hearing normal.  Neck: Supple, thyroid normal.  Respiratory: Respiratory effort normal, BS equal bilaterally without rales, rhonchi, wheezing or stridor.  Cardio: RRR with no MRGs. Brisk peripheral pulses without edema.  Abdomen: Soft, + BS.  Non tender, no guarding, rebound, hernias, masses. Lymphatics: Non tender without lymphadenopathy.  Musculoskeletal: Full ROM, 5/5 strength, normal gait.  Skin: Warm, dry without rashes, lesions, ecchymosis.  Neuro: Cranial nerves intact. Normal muscle tone, no cerebellar symptoms. Sensation intact.  Psych: Awake and oriented X 3, normal affect, Insight and Judgment appropriate.    Cole [name] 8:47 AM

## 2014-02-13 NOTE — Patient Instructions (Signed)
Bad carbs also include fruit juice, alcohol, and sweet tea. These are empty calories that do not signal to your brain that you are full.   Please remember the good carbs are still carbs which convert into sugar. So please measure them out no more than 1/2-1 cup of rice, oatmeal, pasta, and beans.  Veggies are however free foods! Pile them on.   I like lean protein at every meal such as chicken, [place], pork chops, cottage cheese, etc. Just do not fry these meats and please center your meal around vegetable, the meats should be a side dish.   No all fruit is created equal. Please see the list below, the fruit at the bottom is higher in sugars than the fruit at the top   Edema Edema is an abnormal buildup of fluids in your bodytissues. Edema is somewhatdependent on gravity to pull the fluid to the lowest place in your body. That makes the condition more common in the legs and thighs (lower extremities). Painless swelling of the feet and ankles is common and becomes more likely as you get older. It is also common in looser tissues, like around your eyes.  When the affected area is squeezed, the fluid may move out of that spot and leave a dent for a few moments. This dent is called pitting.  CAUSES  There are many possible causes of edema. Eating too much salt and being on your feet or sitting for a long time can cause edema in your legs and ankles. Hot weather may make edema worse. Common medical causes of edema include:  Heart failure.  Liver disease.  Kidney disease.  Weak blood vessels in your legs.  Cancer.  An injury.  Pregnancy.  Some medications.  Obesity. SYMPTOMS  Edema is usually painless.Your skin may look swollen or shiny.  DIAGNOSIS  Your health care provider may be able to diagnose edema by asking about your medical history and doing a physical exam. You may need to have tests such as X-rays, an electrocardiogram, or blood tests to check for medical conditions  that may cause edema.  TREATMENT  Edema treatment depends on the cause. If you have heart, liver, or kidney disease, you need the treatment appropriate for these conditions. General treatment may include:  Elevation of the affected body part above the level of your heart.  Compression of the affected body part. Pressure from elastic bandages or support stockings squeezes the tissues and forces fluid back into the blood vessels. This keeps fluid from entering the tissues.  Restriction of fluid and salt intake.  Use of a water pill (diuretic). These medications are appropriate only for some types of edema. They pull fluid out of your body and make you urinate more often. This gets rid of fluid and reduces swelling, but diuretics can have side effects. Only use diuretics as directed by your health care provider. HOME CARE INSTRUCTIONS   Keep the affected body part above the level of your heart when you are lying down.   Do not sit still or stand for prolonged periods.   Do not put anything directly under your knees when lying down.  Do not wear constricting clothing or garters on your upper legs.   Exercise your legs to work the fluid back into your blood vessels. This may help the swelling go down.   Wear elastic bandages or support stockings to reduce ankle swelling as directed by your health care provider.   Eat a low-salt diet  to reduce fluid if your health care provider recommends it.   Only take medicines as directed by your health care provider. SEEK MEDICAL CARE IF:   Your edema is not responding to treatment.  You have heart, liver, or kidney disease and notice symptoms of edema.  You have edema in your legs that does not improve after elevating them.   You have sudden and unexplained weight gain. SEEK IMMEDIATE MEDICAL CARE IF:   You develop shortness of breath or chest pain.   You cannot breathe when you lie down.  You develop pain, redness, or warmth in  the swollen areas.   You have heart, liver, or kidney disease and suddenly get edema.  You have a fever and your symptoms suddenly get worse. MAKE SURE YOU:   Understand these instructions.  Will watch your condition.  Will get help right away if you are not doing well or get worse. Document Released: 05/05/2005 Document Revised: 09/19/2013 Document Reviewed: 02/25/2013 [facility] Patient Information 2015 [place], [place]. This information is not intended to replace advice given to you by your health care provider. Make sure you discuss any questions you have with your health care provider.

## 2014-02-14 LAB — VITAMIN D 25 HYDROXY (VIT D DEFICIENCY, FRACTURES): Vit D, 25-Hydroxy: 30 ng/mL (ref 30–89)

## 2014-02-20 ENCOUNTER — Other Ambulatory Visit: Payer: Self-pay

## 2014-02-20 ENCOUNTER — Encounter: Payer: Self-pay | Admitting: Internal Medicine

## 2014-02-20 ENCOUNTER — Telehealth: Payer: Self-pay

## 2014-02-20 ENCOUNTER — Ambulatory Visit (AMBULATORY_SURGERY_CENTER): Payer: Federal, State, Local not specified - PPO | Admitting: Internal Medicine

## 2014-02-20 VITALS — BP 120/75 | HR 51 | Temp 96.7°F | Resp 17 | Ht 69.0 in | Wt 236.0 lb

## 2014-02-20 DIAGNOSIS — Z1211 Encounter for screening for malignant neoplasm of colon: Secondary | ICD-10-CM

## 2014-02-20 DIAGNOSIS — K633 Ulcer of intestine: Secondary | ICD-10-CM

## 2014-02-20 DIAGNOSIS — K56699 Other intestinal obstruction unspecified as to partial versus complete obstruction: Secondary | ICD-10-CM

## 2014-02-20 MED ORDER — SODIUM CHLORIDE 0.9 % IV SOLN: 500.0000 mL | INTRAVENOUS | Status: DC

## 2014-02-20 NOTE — Op Note (Signed)
[facility]  [address] [place], [id number]   COLONOSCOPY PROCEDURE REPORT  PATIENT: York, Valliant  MR#: [id number] BIRTHDATE: [birth year] , 48  yrs. old GENDER: male ENDOSCOPIST: Eustace Quail, MD REFERRED TA:[id number] Melford Aase, M.D. PROCEDURE DATE:  02/20/2014 PROCEDURE:   Colonoscopy with biopsy First Screening Colonoscopy - Avg.  risk and is 50 yrs.  old or older - No.  Prior Negative Screening - Now for repeat screening. N/A  History of Adenoma - Now for follow-up colonoscopy & has been > or = to 3 yrs.  N/A  Polyps Removed Today? No.  Recommend repeat exam, <10 yrs? No. ASA CLASS:   Class II INDICATIONS:average risk for colorectal cancer. MEDICATIONS: Monitored anesthesia care and Propofol 400 mg IV  DESCRIPTION OF PROCEDURE:   After the risks benefits and alternatives of the procedure were thoroughly explained, informed consent was obtained.  The digital rectal exam revealed no abnormalities of the rectum.   The [id number]  endoscope was introduced through the anus and advanced to the surgical anastomosis. No adverse events experienced.   The quality of the prep was excellent, using MoviPrep  The instrument was then slowly withdrawn as the colon was fully examined.      COLON FINDINGS: The colonoscope was advanced to the right colon. The area of ulceration and stenosis was encountered. This is presumably the ileocolonic anastomosis from prior surgery, though not entirely certain. The endoscope would not pass beyond the stenosis. Multiple biopsies of the ulcerated area were taken.The examination was otherwise normal. No polyps.  Retroflexed views revealed internal hemorrhoids. The time to cecum=1 minutes 46 seconds.  Withdrawal time=12 minutes 42 seconds.  The scope was withdrawn and the procedure completed. COMPLICATIONS: There were no immediate complications.  ENDOSCOPIC IMPRESSION: 1. Ulceration and stenosis in the right colon as  described. Question secondary to ischemia or possible Crohn's 2. Otherwise normal colon  RECOMMENDATIONS: 1.  Await biopsy results 2.  My office will arrange for you to have a Small Bowel Follow Through examintion "evaluate distal ileum and ileocolonic anastomosis, rule out stenosis" 3. Please obtain your previous outside surgical records and pathology for Dr. Blanch Media review 4. Office followup with Dr. [name] after the above completed 5. Routine repeat screening colonoscopy in 10 years  eSigned:  Eustace Quail, MD 02/20/2014 2:54 PM   cc: Unk Pinto, MD and The Patient

## 2014-02-20 NOTE — Progress Notes (Signed)
Called to room to assist during endoscopic procedure.  Patient ID and intended procedure confirmed with present staff. Received instructions for my participation in the procedure from the performing physician.  

## 2014-02-20 NOTE — Telephone Encounter (Signed)
Pt scheduled for SBFT at [facility] 02/24/14@10 :30am, pt to arrive there at 10:15am. Pt to be NPO after midnight. Left message for pt to call back.  Spoke with pt and he is aware.

## 2014-02-20 NOTE — Patient Instructions (Addendum)
YOU HAD AN ENDOSCOPIC PROCEDURE TODAY AT [facility]: Refer to the procedure report that was given to you for any specific questions about what was found during the examination.  If the procedure report does not answer your questions, please call your gastroenterologist to clarify.  If you requested that your care partner not be given the details of your procedure findings, then the procedure report has been included in a sealed envelope for you to review at your convenience later.  YOU SHOULD EXPECT: Some feelings of bloating in the abdomen. Passage of more gas than usual.  Walking can help get rid of the air that was put into your GI tract during the procedure and reduce the bloating. If you had a lower endoscopy (such as a colonoscopy or flexible sigmoidoscopy) you may notice spotting of blood in your stool or on the toilet paper. If you underwent a bowel prep for your procedure, then you may not have a normal bowel movement for a few days.  DIET: Your first meal following the procedure should be a light meal and then it is ok to progress to your normal diet.  A half-sandwich or bowl of soup is an example of a good first meal.  Heavy or fried foods are harder to digest and may make you feel nauseous or bloated.  Likewise meals heavy in dairy and vegetables can cause extra gas to form and this can also increase the bloating.  Drink plenty of fluids but you should avoid alcoholic beverages for 24 hours.  ACTIVITY: Your care partner should take you home directly after the procedure.  You should plan to take it easy, moving slowly for the rest of the day.  You can resume normal activity the day after the procedure however you should NOT DRIVE or use heavy machinery for 24 hours (because of the sedation medicines used during the test).    SYMPTOMS TO REPORT IMMEDIATELY: A gastroenterologist can be reached at any hour.  During normal business hours, 8:30 AM to 5:00 PM Monday through Friday,  call [phone].  After hours and on weekends, please call the GI answering service at [phone] who will take a message and have the physician on call contact you.   Following lower endoscopy (colonoscopy or flexible sigmoidoscopy):  Excessive amounts of blood in the stool  Significant tenderness or worsening of abdominal pains  Swelling of the abdomen that is new, acute  Fever of 100F or higher    FOLLOW UP: If any biopsies were taken you will be contacted by phone or by letter within the next 1-3 weeks.  Call your gastroenterologist if you have not heard about the biopsies in 3 weeks.  Our staff will call the home number listed on your records the next business day following your procedure to check on you and address any questions or concerns that you may have at that time regarding the information given to you following your procedure. This is a courtesy call and so if there is no answer at the home number and we have not heard from you through the emergency physician on call, we will assume that you have returned to your regular daily activities without incident.  SIGNATURES/CONFIDENTIALITY: You and/or your care partner have signed paperwork which will be entered into your electronic medical record.  These signatures attest to the fact that that the information above on your After Visit Summary has been reviewed and is understood.  Full responsibility of the confidentiality  of this discharge information lies with you and/or your care-partner.   Dr Blanch Media office personal will arrange Small Bowel Follow Through exam   Please make office appoint. With Dr [name] after receive your surgical records

## 2014-02-20 NOTE — Progress Notes (Signed)
A/ox3, pleased with MAC, report to RN 

## 2014-02-21 ENCOUNTER — Telehealth: Payer: Self-pay | Admitting: *Deleted

## 2014-02-21 NOTE — Telephone Encounter (Signed)
  Follow up Call-  Call back number 02/20/2014  Post procedure Call Back phone  # [phone]  Permission to leave phone message Yes     Patient questions:  Message left to call us if necessary.

## 2014-02-24 ENCOUNTER — Other Ambulatory Visit: Payer: Self-pay | Admitting: *Deleted

## 2014-02-24 ENCOUNTER — Ambulatory Visit (HOSPITAL_COMMUNITY)
Admission: RE | Admit: 2014-02-24 | Discharge: 2014-02-24 | Disposition: A | Discharge status: Home / Self Care | Payer: Federal, State, Local not specified - PPO | Source: Ambulatory Visit | Attending: Diagnostic Radiology | Admitting: Diagnostic Radiology

## 2014-02-24 DIAGNOSIS — K5669 Other intestinal obstruction: Secondary | ICD-10-CM | POA: Diagnosis not present

## 2014-02-24 DIAGNOSIS — K56699 Other intestinal obstruction unspecified as to partial versus complete obstruction: Secondary | ICD-10-CM

## 2014-02-24 DIAGNOSIS — Z9049 Acquired absence of other specified parts of digestive tract: Secondary | ICD-10-CM | POA: Diagnosis not present

## 2014-02-24 DIAGNOSIS — R197 Diarrhea, unspecified: Secondary | ICD-10-CM

## 2014-02-27 ENCOUNTER — Telehealth: Payer: Self-pay | Admitting: Internal Medicine

## 2014-02-27 NOTE — Telephone Encounter (Signed)
Spoke with pts wife and let her know that [facility] and the hospital and they do not do CT scans on the weekends unless on an emergent basis. Wife aware and they will reschedule for an early am appt on a Monday at [facility]. Number given to her to reschedule.

## 2014-03-01 ENCOUNTER — Other Ambulatory Visit: Payer: Federal, State, Local not specified - PPO

## 2014-03-01 ENCOUNTER — Encounter: Payer: Self-pay | Admitting: Internal Medicine

## 2014-03-17 ENCOUNTER — Ambulatory Visit (INDEPENDENT_AMBULATORY_CARE_PROVIDER_SITE_OTHER)
Admission: RE | Admit: 2014-03-17 | Discharge: 2014-03-17 | Disposition: A | Discharge status: Home / Self Care | Payer: Federal, State, Local not specified - PPO | Source: Ambulatory Visit | Attending: Internal Medicine | Admitting: Internal Medicine

## 2014-03-17 DIAGNOSIS — R197 Diarrhea, unspecified: Secondary | ICD-10-CM

## 2014-03-17 MED ORDER — IOHEXOL 300 MG/ML  SOLN
100.0000 mL | Freq: Once | INTRAMUSCULAR | Status: AC | PRN
  Administered 2014-03-17: 100 mL via INTRAVENOUS

## 2014-03-20 ENCOUNTER — Other Ambulatory Visit: Payer: Self-pay

## 2014-03-20 DIAGNOSIS — N289 Disorder of kidney and ureter, unspecified: Secondary | ICD-10-CM

## 2014-03-27 ENCOUNTER — Ambulatory Visit (HOSPITAL_COMMUNITY)
Admission: RE | Admit: 2014-03-27 | Discharge: 2014-03-27 | Disposition: A | Discharge status: Home / Self Care | Payer: Federal, State, Local not specified - PPO | Source: Ambulatory Visit | Attending: Diagnostic Radiology | Admitting: Diagnostic Radiology

## 2014-03-27 ENCOUNTER — Other Ambulatory Visit: Payer: Self-pay | Admitting: Internal Medicine

## 2014-03-27 DIAGNOSIS — D3502 Benign neoplasm of left adrenal gland: Secondary | ICD-10-CM | POA: Insufficient documentation

## 2014-03-27 DIAGNOSIS — N289 Disorder of kidney and ureter, unspecified: Secondary | ICD-10-CM

## 2014-03-27 MED ORDER — GADOBENATE DIMEGLUMINE 529 MG/ML IV SOLN
20.0000 mL | Freq: Once | INTRAVENOUS | Status: AC | PRN
  Administered 2014-03-27: 20 mL via INTRAVENOUS

## 2014-04-06 ENCOUNTER — Other Ambulatory Visit: Payer: Self-pay | Admitting: Internal Medicine

## 2014-04-06 MED ORDER — AMPHETAMINE-DEXTROAMPHETAMINE 20 MG PO TABS: 20.0000 mg | ORAL_TABLET | Freq: Two times a day (BID) | ORAL | Status: DC

## 2014-04-11 ENCOUNTER — Ambulatory Visit: Payer: Federal, State, Local not specified - PPO | Admitting: Internal Medicine

## 2014-06-02 ENCOUNTER — Ambulatory Visit: Payer: Federal, State, Local not specified - PPO | Admitting: Internal Medicine

## 2014-06-05 ENCOUNTER — Other Ambulatory Visit: Payer: Self-pay | Admitting: Physician Assistant

## 2014-06-05 MED ORDER — AMPHETAMINE-DEXTROAMPHETAMINE 20 MG PO TABS: 20.0000 mg | ORAL_TABLET | Freq: Two times a day (BID) | ORAL | Status: DC

## 2014-06-12 ENCOUNTER — Ambulatory Visit (INDEPENDENT_AMBULATORY_CARE_PROVIDER_SITE_OTHER): Payer: Federal, State, Local not specified - PPO | Admitting: Internal Medicine

## 2014-06-12 ENCOUNTER — Other Ambulatory Visit (INDEPENDENT_AMBULATORY_CARE_PROVIDER_SITE_OTHER): Payer: Federal, State, Local not specified - PPO

## 2014-06-12 ENCOUNTER — Other Ambulatory Visit: Payer: Self-pay

## 2014-06-12 ENCOUNTER — Encounter: Payer: Self-pay | Admitting: Internal Medicine

## 2014-06-12 VITALS — BP 140/82 | HR 60 | Ht 68.75 in | Wt 241.0 lb

## 2014-06-12 DIAGNOSIS — K50019 Crohn's disease of small intestine with unspecified complications: Secondary | ICD-10-CM

## 2014-06-12 DIAGNOSIS — D508 Other iron deficiency anemias: Secondary | ICD-10-CM

## 2014-06-12 DIAGNOSIS — K5000 Crohn's disease of small intestine without complications: Secondary | ICD-10-CM | POA: Insufficient documentation

## 2014-06-12 LAB — VITAMIN B12: VITAMIN B 12: 299 pg/mL (ref 211–911)

## 2014-06-12 LAB — C-REACTIVE PROTEIN: CRP: 0.6 mg/dL (ref 0.5–20.0)

## 2014-06-12 NOTE — Patient Instructions (Signed)
Your physician has requested that you go to the basement for the following lab work before leaving today: Vitamin B12 and CRP.   Please follow up with Dr. [name] in 6 months or sooner if needed.  cc: Unk Pinto, MD

## 2014-06-12 NOTE — Progress Notes (Signed)
HISTORY OF PRESENT ILLNESS:  Cole [name] is a 49 y.o. male who was initially evaluated 01/09/2014 for chronic diarrhea and the need for colonoscopy. At that time he reported complicated appendicitis requiring surgery. No details. See that dictation for details. The patient underwent complete colonoscopy 02/20/2014. He was found to have evidence of prior see Any. The ileocolonic anastomosis was stenotic and ulcerated. The biopsies were not inconsistent with inflammatory bowel disease. He subsequently underwent small bowel follow through 02/24/2014. This revealed changes consistent with Crohn's disease involving the terminal ileum (5 cm segment). He subsequently underwent CT scan of the abdomen and pelvis which revealed some inflammation at the level of the anastomosis. No overwhelming abnormalities otherwise. He was, however, noted to have a renal and adrenal lesions. I referred him to Dr. Rana Snare who is currently evaluating these lesions. A renal lesion is felt to be benign but requires follow-up. The adrenal lesion is being investigated for functionality. The patient presents today with his wife. He reports having actually no diarrhea since I last saw him. He denies any type of abdominal pain. His weight has been stable. I was able to obtain outside records for review. He underwent appendectomy with partial ileocecal rectum E August 2011 for acute suppurative appendicitis with phlegmon abscess. Review of the pathology reveals chronic inflammation and crypt distortion possibly consistent with Crohn's disease.  REVIEW OF SYSTEMS:  All non-GI ROS negative except for sinus allergy trouble  Past Medical History  Diagnosis Date  . Hyperlipidemia   . Hypertension   . ADD (attention deficit disorder)   . ASHD (arteriosclerotic heart disease)   . Morbid obesity   . Positive for macroalbuminuria   . Crohn's disease   . Myocardial infarction   . Appendicitis   . Colitis     Past Surgical History   Procedure Laterality Date  . Appendectomy    . Colon surgery    . Coronary stent placement      Social History Cole [name]  reports that he has been smoking.  He has never used smokeless tobacco. He reports that he does not drink alcohol or use illicit drugs.  family history includes Diabetes in his maternal grandmother.  No Known Allergies     PHYSICAL EXAMINATION:  Vital signs: BP 140/82 mmHg  Pulse 60  Ht 5' 8.75" (1.746 m)  Wt 241 lb (109.317 kg)  BMI 35.86 kg/m2 General: Well-developed, well-nourished, no acute distress Abdomen: Not reexamined. Psychiatric: alert and oriented x3. Cooperative   ASSESSMENT:  #1. Ileal Crohn's disease. 5 cm segment on small bowel follow-through. Currently asymptomatic #2. Status post ileocecectomy for supper of appendicitis with abscess and phlegmon. August 2011. Pathology revealing changes consistent with Crohn's disease   PLAN:  #1. Long discussion today on Crohn's disease. We discussed potential complications. We touched on different treatment regimens. At this point, being asymptomatic, following closely. #2. B12, C-reactive protein today #3. Routine office follow-up 6 months. Contact the office in the interim for problems or questions #4. Literature on Crohn's disease and referral to Crohn's and colitis Foundation of [place] website

## 2014-06-19 ENCOUNTER — Encounter: Payer: Self-pay | Admitting: Physician Assistant

## 2014-06-19 ENCOUNTER — Ambulatory Visit (INDEPENDENT_AMBULATORY_CARE_PROVIDER_SITE_OTHER): Payer: Federal, State, Local not specified - PPO | Admitting: Physician Assistant

## 2014-06-19 VITALS — BP 120/78 | HR 60 | Temp 97.9°F | Resp 16 | Ht 69.5 in | Wt 237.0 lb

## 2014-06-19 DIAGNOSIS — K50019 Crohn's disease of small intestine with unspecified complications: Secondary | ICD-10-CM

## 2014-06-19 DIAGNOSIS — R7303 Prediabetes: Secondary | ICD-10-CM

## 2014-06-19 DIAGNOSIS — E559 Vitamin D deficiency, unspecified: Secondary | ICD-10-CM

## 2014-06-19 DIAGNOSIS — I2510 Atherosclerotic heart disease of native coronary artery without angina pectoris: Secondary | ICD-10-CM

## 2014-06-19 DIAGNOSIS — F909 Attention-deficit hyperactivity disorder, unspecified type: Secondary | ICD-10-CM

## 2014-06-19 DIAGNOSIS — Z79899 Other long term (current) drug therapy: Secondary | ICD-10-CM

## 2014-06-19 DIAGNOSIS — F988 Other specified behavioral and emotional disorders with onset usually occurring in childhood and adolescence: Secondary | ICD-10-CM

## 2014-06-19 DIAGNOSIS — E785 Hyperlipidemia, unspecified: Secondary | ICD-10-CM

## 2014-06-19 DIAGNOSIS — I10 Essential (primary) hypertension: Secondary | ICD-10-CM

## 2014-06-19 DIAGNOSIS — R7309 Other abnormal glucose: Secondary | ICD-10-CM

## 2014-06-19 DIAGNOSIS — E669 Obesity, unspecified: Secondary | ICD-10-CM

## 2014-06-19 LAB — CBC WITH DIFFERENTIAL/PLATELET
BASOS ABS: 0 10*3/uL (ref 0.0–0.1)
Basophils Relative: 0 % (ref 0–1)
Eosinophils Absolute: 0.1 10*3/uL (ref 0.0–0.7)
Eosinophils Relative: 1 % (ref 0–5)
HCT: 45.3 % (ref 39.0–52.0)
Hemoglobin: 15.3 g/dL (ref 13.0–17.0)
LYMPHS PCT: 11 % — AB (ref 12–46)
Lymphs Abs: 1.2 10*3/uL (ref 0.7–4.0)
MCH: 30.7 pg (ref 26.0–34.0)
MCHC: 33.8 g/dL (ref 30.0–36.0)
MCV: 90.8 fL (ref 78.0–100.0)
MPV: 10.8 fL (ref 8.6–12.4)
Monocytes Absolute: 0.5 10*3/uL (ref 0.1–1.0)
Monocytes Relative: 5 % (ref 3–12)
NEUTROS PCT: 83 % — AB (ref 43–77)
Neutro Abs: 8.7 10*3/uL — ABNORMAL HIGH (ref 1.7–7.7)
Platelets: 286 10*3/uL (ref 150–400)
RBC: 4.99 MIL/uL (ref 4.22–5.81)
RDW: 14.3 % (ref 11.5–15.5)
WBC: 10.5 10*3/uL (ref 4.0–10.5)

## 2014-06-19 LAB — HEMOGLOBIN A1C
HEMOGLOBIN A1C: 5.5 % (ref <5.7)
Mean Plasma Glucose: 111 mg/dL (ref <117)

## 2014-06-19 NOTE — Patient Instructions (Signed)
Add vitamin D 5000 IU daily.   Diabetes is a very complicated disease...lets simplify it.  An easy way to look at it to understand the complications is if you think of the extra sugar floating in your blood stream as glass shards floating through your blood stream.    Diabetes affects your small vessels first: 1) The glass shards (sugar) scraps down the tiny blood vessels in your eyes and lead to diabetic retinopathy, the leading cause of blindness in the [place]. Diabetes is the leading cause of newly diagnosed adult (76 to 50 years of age) blindness in the [place].  2) The glass shards scratches down the tiny vessels of your legs leading to nerve damage called neuropathy and can lead to amputations of your feet. More than 60% of all non-traumatic amputations of lower limbs occur in people with diabetes.  3) Over time the small vessels in your brain are shredded and closed off, individually this does not cause any problems but over a long period of time many of the small vessels being blocked can lead to Vascular Dementia.   4) Your kidney's are a filter system and have a "net" that keeps certain things in the body and lets bad things out. Sugar shreds this net and leads to kidney damage and eventually failure. Decreasing the sugar that is destroying the net and certain blood pressure medications can help stop or decrease progression of kidney disease. Diabetes was the primary cause of kidney failure in 44 percent of all new cases in 2011.  5) Diabetes also destroys the small vessels in your penis that lead to erectile dysfunction. Eventually the vessels are so damaged that you may not be responsive to cialis or viagra.   Diabetes and your large vessels: Your larger vessels consist of your coronary arteries in your heart and the carotid vessels to your brain. Diabetes or even increased sugars put you at 300% increased risk of heart attack and stroke and this is why.. The sugar scrapes down your  large blood vessels and your body sees this as an internal injury and tries to repair itself. Just like you get a scab on your skin, your platelets will stick to the blood vessel wall trying to heal it. This is why we have diabetics on low dose aspirin daily, this prevents the platelets from sticking and can prevent plaque formation. In addition, your body takes cholesterol and tries to shove it into the open wound. This is why we want your LDL, or bad cholesterol, below 70.   The combination of platelets and cholesterol over 5-10 years forms plaque that can break off and cause a heart attack or stroke.   PLEASE REMEMBER:  Diabetes is preventable! Up to 79 percent of complications and morbidities among individuals with type 2 diabetes can be prevented, delayed, or effectively treated and minimized with regular visits to a health professional, appropriate monitoring and medication, and a healthy diet and lifestyle.      Bad carbs also include fruit juice, alcohol, and sweet tea. These are empty calories that do not signal to your brain that you are full.   Please remember the good carbs are still carbs which convert into sugar. So please measure them out no more than 1/2-1 cup of rice, oatmeal, pasta, and beans  Veggies are however free foods! Pile them on.   Not all fruit is created equal. Please see the list below, the fruit at the bottom is higher in sugars than the  fruit at the top. Please avoid all dried fruits.

## 2014-06-19 NOTE — Progress Notes (Signed)
Assessment and Plan:  Hypertension: Continue medication, monitor blood pressure at home. Continue DASH diet.  Reminder to go to the ER if any CP, SOB, nausea, dizziness, severe HA, changes vision/speech, left arm numbness and tingling, and jaw pain. Cholesterol: Continue diet and exercise. Check cholesterol.  Pre-diabetes-Continue diet and exercise. Check A1C Vitamin D Def- check level and continue medications.  Obesity with co morbidities- long discussion about weight loss, diet, and exercise   Continue diet and meds as discussed. Further disposition pending results of labs.  HPI 49 y.o. male  presents for 3 month follow up with hypertension, hyperlipidemia, prediabetes and vitamin D.  His blood pressure has been controlled at home, today their BP is BP: 120/78 mmHg  He does not workout. He denies chest pain, shortness of breath, dizziness.  He is on cholesterol medication and denies myalgias. His cholesterol is at goal. The cholesterol last visit was:   Lab Results  Component Value Date   CHOL 109 02/13/2014   HDL 36* 02/13/2014   LDLCALC 45 02/13/2014   TRIG 142 02/13/2014   CHOLHDL 3.0 02/13/2014   He has been working on diet and exercise for prediabetes, and denies paresthesia of the feet, polydipsia, polyuria and visual disturbances. Last A1C in the office was:  Lab Results  Component Value Date   HGBA1C 5.7* 02/13/2014  Patient is on Vitamin D supplement.   Lab Results  Component Value Date   VD25OH 30 02/13/2014   Patient was having diarrhea, went to Dr. [name] and has been diagnosed with Crohn's disease and will follow with him in another 6 months.  He also follows with Dr. 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 Risa Grill, had stimulation test this AM and is waiting for results.   BMI is Body mass index is 34.51 kg/(m^2)., he is working on diet and exercise. Wt Readings from Last 3 Encounters:  06/19/14 237 lb (107.502 kg)  06/12/14  241 lb (109.317 kg)  03/27/14 220 lb (99.791 kg)    Current Medications:  Current Outpatient Prescriptions on File Prior to Visit  Medication Sig Dispense Refill  . amphetamine-dextroamphetamine (ADDERALL) 20 MG tablet Take 1 tablet (20 mg total) by mouth 2 (two) times daily. 60 tablet 0  . aspirin 325 MG tablet Take 325 mg by mouth daily.    Marland Kitchen atorvastatin (LIPITOR) 80 MG tablet Take 1 tablet (80 mg total) by mouth daily. 90 tablet 1  . cetirizine (ZYRTEC) 10 MG tablet Take 10 mg by mouth daily.    . cyclobenzaprine (FLEXERIL) 10 MG tablet Take 1 tablet (10 mg total) by mouth every 8 (eight) hours as needed for muscle spasms. 60 tablet 1  . metoprolol (LOPRESSOR) 50 MG tablet Take 1 tablet (50 mg total) by mouth 2 (two) times daily. 180 tablet 1  . Multiple Vitamins-Minerals (OSTEO COMPLEX PO) Take 1 capsule by mouth daily.     No current facility-administered medications on file prior to visit.   Medical History:  Past Medical History  Diagnosis Date  . Hyperlipidemia   . Hypertension   . ADD (attention deficit disorder)   . ASHD (arteriosclerotic heart disease)   . Morbid obesity   . Positive for macroalbuminuria   . Crohn's disease   . Myocardial infarction   . Appendicitis   . Colitis    Allergies: No Known Allergies   Review of Systems:  Review of Systems  Constitutional: Negative.   HENT: Negative.   Eyes: Negative.  Respiratory: Negative.   Cardiovascular: Negative.   Gastrointestinal: Negative.   Genitourinary: Negative.   Musculoskeletal: Negative.   Skin: Negative.   Neurological: Negative.   Endo/Heme/Allergies: Negative.   Psychiatric/Behavioral: Negative.     Family history- Review and unchanged Social history- Review and unchanged Physical Exam: BP 120/78 mmHg  Pulse 60  Temp(Src) 97.9 F (36.6 C)  Resp 16  Ht 5' 9.5" (1.765 m)  Wt 237 lb (107.502 kg)  BMI 34.51 kg/m2 Wt Readings from Last 3 Encounters:  06/19/14 237 lb (107.502 kg)   06/12/14 241 lb (109.317 kg)  03/27/14 220 lb (99.791 kg)   General Appearance: Well nourished, in no apparent distress. Eyes: PERRLA, EOMs, conjunctiva no swelling or erythema Sinuses: No Frontal/maxillary tenderness ENT/Mouth: Ext aud canals clear, TMs without erythema, bulging. No erythema, swelling, or exudate on post pharynx.  Tonsils not swollen or erythematous. Hearing normal.  Neck: Supple, thyroid normal.  Respiratory: Respiratory effort normal, BS equal bilaterally without rales, rhonchi, wheezing or stridor.  Cardio: RRR with no MRGs. Brisk peripheral pulses without edema.  Abdomen: Soft, + BS, obese,  Non tender, no guarding, rebound, hernias, masses. Lymphatics: Non tender without lymphadenopathy.  Musculoskeletal: Full ROM, 5/5 strength, Normal Skin: Warm, dry without rashes, lesions, ecchymosis.  Neuro: Cranial nerves intact. Normal muscle tone, no cerebellar symptoms. Psych: Awake and oriented X 3, normal affect, Insight and Judgment appropriate.    Vicie Mutters, PA-C 9:38 AM [facility]

## 2014-06-20 LAB — BASIC METABOLIC PANEL WITH GFR
BUN: 14 mg/dL (ref 6–23)
CO2: 25 mEq/L (ref 19–32)
Calcium: 9.5 mg/dL (ref 8.4–10.5)
Chloride: 105 mEq/L (ref 96–112)
Creat: 0.71 mg/dL (ref 0.50–1.35)
Glucose, Bld: 77 mg/dL (ref 70–99)
Potassium: 5.3 mEq/L (ref 3.5–5.3)
SODIUM: 136 meq/L (ref 135–145)

## 2014-06-20 LAB — HEPATIC FUNCTION PANEL
ALBUMIN: 4.1 g/dL (ref 3.5–5.2)
ALT: 28 U/L (ref 0–53)
AST: 19 U/L (ref 0–37)
Alkaline Phosphatase: 121 U/L — ABNORMAL HIGH (ref 39–117)
BILIRUBIN INDIRECT: 0.3 mg/dL (ref 0.2–1.2)
Bilirubin, Direct: 0.1 mg/dL (ref 0.0–0.3)
TOTAL PROTEIN: 7.1 g/dL (ref 6.0–8.3)
Total Bilirubin: 0.4 mg/dL (ref 0.2–1.2)

## 2014-06-20 LAB — MAGNESIUM: Magnesium: 2 mg/dL (ref 1.5–2.5)

## 2014-06-20 LAB — LIPID PANEL
CHOL/HDL RATIO: 2.7 ratio
CHOLESTEROL: 120 mg/dL (ref 0–200)
HDL: 44 mg/dL (ref >39)
LDL Cholesterol: 61 mg/dL (ref 0–99)
TRIGLYCERIDES: 73 mg/dL (ref <150)
VLDL: 15 mg/dL (ref 0–40)

## 2014-06-20 LAB — INSULIN, FASTING: INSULIN FASTING, SERUM: 11.4 u[IU]/mL (ref 2.0–19.6)

## 2014-06-21 LAB — TSH: TSH: 0.573 u[IU]/mL (ref 0.350–4.500)

## 2014-06-26 LAB — VITAMIN D 25 HYDROXY (VIT D DEFICIENCY, FRACTURES)

## 2014-08-14 ENCOUNTER — Other Ambulatory Visit: Payer: Self-pay | Admitting: Physician Assistant

## 2014-08-14 MED ORDER — AMPHETAMINE-DEXTROAMPHETAMINE 20 MG PO TABS: 20.0000 mg | ORAL_TABLET | Freq: Two times a day (BID) | ORAL | Status: DC

## 2014-09-07 ENCOUNTER — Other Ambulatory Visit (HOSPITAL_COMMUNITY): Payer: Self-pay | Admitting: Urology

## 2014-09-07 DIAGNOSIS — N281 Cyst of kidney, acquired: Secondary | ICD-10-CM

## 2014-09-13 ENCOUNTER — Other Ambulatory Visit: Payer: Self-pay

## 2014-09-13 MED ORDER — METOPROLOL TARTRATE 50 MG PO TABS: 50.0000 mg | ORAL_TABLET | Freq: Two times a day (BID) | ORAL | Status: DC

## 2014-09-13 MED ORDER — ATORVASTATIN CALCIUM 80 MG PO TABS: 80.0000 mg | ORAL_TABLET | Freq: Every day | ORAL | Status: DC

## 2014-09-15 ENCOUNTER — Ambulatory Visit (HOSPITAL_COMMUNITY)
Admission: RE | Admit: 2014-09-15 | Discharge: 2014-09-15 | Disposition: A | Discharge status: Home / Self Care | Payer: Federal, State, Local not specified - PPO | Source: Ambulatory Visit | Attending: Urology | Admitting: Urology

## 2014-09-15 DIAGNOSIS — Q6100 Congenital renal cyst, unspecified: Secondary | ICD-10-CM | POA: Insufficient documentation

## 2014-09-15 DIAGNOSIS — D3502 Benign neoplasm of left adrenal gland: Secondary | ICD-10-CM | POA: Insufficient documentation

## 2014-09-15 DIAGNOSIS — N281 Cyst of kidney, acquired: Secondary | ICD-10-CM

## 2014-09-15 LAB — POCT I-STAT CREATININE: Creatinine, Ser: 0.9 mg/dL (ref 0.50–1.35)

## 2014-09-15 MED ORDER — GADOBENATE DIMEGLUMINE 529 MG/ML IV SOLN
20.0000 mL | Freq: Once | INTRAVENOUS | Status: AC | PRN
  Administered 2014-09-15: 20 mL via INTRAVENOUS

## 2014-11-06 ENCOUNTER — Other Ambulatory Visit: Payer: Self-pay | Admitting: Physician Assistant

## 2014-11-06 ENCOUNTER — Encounter: Payer: Self-pay | Admitting: Physician Assistant

## 2014-11-06 MED ORDER — AMPHETAMINE-DEXTROAMPHETAMINE 20 MG PO TABS: 20.0000 mg | ORAL_TABLET | Freq: Two times a day (BID) | ORAL | Status: DC

## 2014-11-14 ENCOUNTER — Other Ambulatory Visit: Payer: Self-pay | Admitting: Urology

## 2014-12-04 ENCOUNTER — Ambulatory Visit (INDEPENDENT_AMBULATORY_CARE_PROVIDER_SITE_OTHER): Payer: Federal, State, Local not specified - PPO | Admitting: Physician Assistant

## 2014-12-04 ENCOUNTER — Encounter: Payer: Self-pay | Admitting: Physician Assistant

## 2014-12-04 VITALS — BP 128/84 | HR 64 | Temp 98.6°F | Resp 16 | Ht 69.5 in | Wt 233.4 lb

## 2014-12-04 DIAGNOSIS — F17200 Nicotine dependence, unspecified, uncomplicated: Secondary | ICD-10-CM | POA: Insufficient documentation

## 2014-12-04 DIAGNOSIS — E669 Obesity, unspecified: Secondary | ICD-10-CM

## 2014-12-04 DIAGNOSIS — E785 Hyperlipidemia, unspecified: Secondary | ICD-10-CM

## 2014-12-04 DIAGNOSIS — Z79899 Other long term (current) drug therapy: Secondary | ICD-10-CM | POA: Insufficient documentation

## 2014-12-04 DIAGNOSIS — E559 Vitamin D deficiency, unspecified: Secondary | ICD-10-CM | POA: Insufficient documentation

## 2014-12-04 DIAGNOSIS — F909 Attention-deficit hyperactivity disorder, unspecified type: Secondary | ICD-10-CM

## 2014-12-04 DIAGNOSIS — F988 Other specified behavioral and emotional disorders with onset usually occurring in childhood and adolescence: Secondary | ICD-10-CM

## 2014-12-04 DIAGNOSIS — I2510 Atherosclerotic heart disease of native coronary artery without angina pectoris: Secondary | ICD-10-CM

## 2014-12-04 DIAGNOSIS — I10 Essential (primary) hypertension: Secondary | ICD-10-CM

## 2014-12-04 DIAGNOSIS — N2889 Other specified disorders of kidney and ureter: Secondary | ICD-10-CM | POA: Insufficient documentation

## 2014-12-04 LAB — CBC WITH DIFFERENTIAL/PLATELET
BASOS PCT: 0 % (ref 0–1)
Basophils Absolute: 0 10*3/uL (ref 0.0–0.1)
EOS ABS: 0.2 10*3/uL (ref 0.0–0.7)
Eosinophils Relative: 2 % (ref 0–5)
HCT: 40.2 % (ref 39.0–52.0)
Hemoglobin: 13.6 g/dL (ref 13.0–17.0)
LYMPHS ABS: 1.7 10*3/uL (ref 0.7–4.0)
Lymphocytes Relative: 18 % (ref 12–46)
MCH: 30.4 pg (ref 26.0–34.0)
MCHC: 33.8 g/dL (ref 30.0–36.0)
MCV: 89.7 fL (ref 78.0–100.0)
MPV: 10.1 fL (ref 8.6–12.4)
Monocytes Absolute: 0.6 10*3/uL (ref 0.1–1.0)
Monocytes Relative: 6 % (ref 3–12)
NEUTROS ABS: 7 10*3/uL (ref 1.7–7.7)
NEUTROS PCT: 74 % (ref 43–77)
Platelets: 246 10*3/uL (ref 150–400)
RBC: 4.48 MIL/uL (ref 4.22–5.81)
RDW: 14.2 % (ref 11.5–15.5)
WBC: 9.4 10*3/uL (ref 4.0–10.5)

## 2014-12-04 NOTE — Patient Instructions (Signed)
I think it is possible that you have sleep apnea. It can cause interrupted sleep, headaches, frequent awakenings, fatigue, dry mouth, fast/slow heart beats, memory issues, anxiety/depression, swelling, numbness tingling hands/feet, weight gain, shortness of breath, and the list goes on. Sleep apnea needs to be ruled out because if it is left untreated it does eventually lead to abnormal heart beats, lung failure or heart failure as well as increasing the risk of heart attack and stroke. There are masks you can wear OR a mouth piece that I can give you information about. Often times though people feel MUCH better after getting treatment.   Sleep Apnea  Sleep apnea is a sleep disorder characterized by abnormal pauses in breathing while you sleep. When your breathing pauses, the level of oxygen in your blood decreases. This causes you to move out of deep sleep and into light sleep. As a result, your quality of sleep is poor, and the system that carries your blood throughout your body (cardiovascular system) experiences stress. If sleep apnea remains untreated, the following conditions can develop:  High blood pressure (hypertension).  Coronary artery disease.  Inability to achieve or maintain an erection (impotence).  Impairment of your thought process (cognitive dysfunction). There are three types of sleep apnea: 1. Obstructive sleep apnea--Pauses in breathing during sleep because of a blocked airway. 2. Central sleep apnea--Pauses in breathing during sleep because the area of the brain that controls your breathing does not send the correct signals to the muscles that control breathing. 3. Mixed sleep apnea--A combination of both obstructive and central sleep apnea.  RISK FACTORS The following risk factors can increase your risk of developing sleep apnea:  Being overweight.  Smoking.  Having narrow passages in your nose and throat.  Being of older age.  Being male.  Alcohol use.   Sedative and tranquilizer use.  Ethnicity. Among individuals younger than 35 years, African Americans are at increased risk of sleep apnea. SYMPTOMS   Difficulty staying asleep.  Daytime sleepiness and fatigue.  Loss of energy.  Irritability.  Loud, heavy snoring.  Morning headaches.  Trouble concentrating.  Forgetfulness.  Decreased interest in sex. DIAGNOSIS  In order to diagnose sleep apnea, your caregiver will perform a physical examination. Your caregiver may suggest that you take a home sleep test. Your caregiver may also recommend that you spend the night in a sleep lab. In the sleep lab, several monitors record information about your heart, lungs, and brain while you sleep. Your leg and arm movements and blood oxygen level are also recorded. TREATMENT The following actions may help to resolve mild sleep apnea:  Sleeping on your side.   Using a decongestant if you have nasal congestion.   Avoiding the use of depressants, including alcohol, sedatives, and narcotics.   Losing weight and modifying your diet if you are overweight. There also are devices and treatments to help open your airway:  Oral appliances. These are custom-made mouthpieces that shift your lower jaw forward and slightly open your bite. This opens your airway.  Devices that create positive airway pressure. This positive pressure "splints" your airway open to help you breathe better during sleep. The following devices create positive airway pressure:  Continuous positive airway pressure (CPAP) device. The CPAP device creates a continuous level of air pressure with an air pump. The air is delivered to your airway through a mask while you sleep. This continuous pressure keeps your airway open.  Nasal expiratory positive airway pressure (EPAP) device. The EPAP device  creates positive air pressure as you exhale. The device consists of single-use valves, which are inserted into each nostril and held in  place by adhesive. The valves create very little resistance when you inhale but create much more resistance when you exhale. That increased resistance creates the positive airway pressure. This positive pressure while you exhale keeps your airway open, making it easier to breath when you inhale again.  Bilevel positive airway pressure (BPAP) device. The BPAP device is used mainly in patients with central sleep apnea. This device is similar to the CPAP device because it also uses an air pump to deliver continuous air pressure through a mask. However, with the BPAP machine, the pressure is set at two different levels. The pressure when you exhale is lower than the pressure when you inhale.  Surgery. Typically, surgery is only done if you cannot comply with less invasive treatments or if the less invasive treatments do not improve your condition. Surgery involves removing excess tissue in your airway to create a wider passage way. Document Released: 04/25/2002 Document Revised: 08/30/2012 Document Reviewed: 09/11/2011 [facility] Patient Information 2015 [place], [place]. This information is not intended to replace advice given to you by your health care provider. Make sure you discuss any questions you have with your health care provider.      Smoking Cessation, Tips for Success If you are ready to quit smoking, congratulations! You have chosen to help yourself be healthier. Cigarettes bring nicotine, tar, carbon monoxide, and other irritants into your body. Your lungs, heart, and blood vessels will be able to work better without these poisons. There are many different ways to quit smoking. Nicotine gum, nicotine patches, a nicotine inhaler, or nicotine nasal spray can help with physical craving. Hypnosis, support groups, and medicines help break the habit of smoking. WHAT THINGS CAN I DO TO MAKE QUITTING EASIER?  Here are some tips to help you quit for good:  Pick a date when you will quit smoking  completely. Tell all of your friends and family about your plan to quit on that date.  Do not try to slowly cut down on the number of cigarettes you are smoking. Pick a quit date and quit smoking completely starting on that day.  Throw away all cigarettes.   Clean and remove all ashtrays from your home, work, and car.  On a card, write down your reasons for quitting. Carry the card with you and read it when you get the urge to smoke.  Cleanse your body of nicotine. Drink enough water and fluids to keep your urine clear or pale yellow. Do this after quitting to flush the nicotine from your body.  Learn to predict your moods. Do not let a bad situation be your excuse to have a cigarette. Some situations in your life might tempt you into wanting a cigarette.  Never have "just one" cigarette. It leads to wanting another and another. Remind yourself of your decision to quit.  Change habits associated with smoking. If you smoked while driving or when feeling stressed, try other activities to replace smoking. Stand up when drinking your coffee. Brush your teeth after eating. Sit in a different chair when you read the paper. Avoid alcohol while trying to quit, and try to drink fewer caffeinated beverages. Alcohol and caffeine may urge you to smoke.  Avoid foods and drinks that can trigger a desire to smoke, such as sugary or spicy foods and alcohol.  Ask people who smoke not to smoke around  you.  Have something planned to do right after eating or having a cup of coffee. For example, plan to take a walk or exercise.  Try a relaxation exercise to calm you down and decrease your stress. Remember, you may be tense and nervous for the first 2 weeks after you quit, but this will pass.  Find new activities to keep your hands busy. Play with a pen, coin, or rubber band. Doodle or draw things on paper.  Brush your teeth right after eating. This will help cut down on the craving for the taste of tobacco  after meals. You can also try mouthwash.   Use oral substitutes in place of cigarettes. Try using lemon drops, carrots, cinnamon sticks, or chewing gum. Keep them handy so they are available when you have the urge to smoke.  When you have the urge to smoke, try deep breathing.  Designate your home as a nonsmoking area.  If you are a heavy smoker, ask your health care provider about a prescription for nicotine chewing gum. It can ease your withdrawal from nicotine.  Reward yourself. Set aside the cigarette money you save and buy yourself something nice.  Look for support from others. Join a support group or smoking cessation program. Ask someone at home or at work to help you with your plan to quit smoking.  Always ask yourself, "Do I need this cigarette or is this just a reflex?" Tell yourself, "Today, I choose not to smoke," or "I do not want to smoke." You are reminding yourself of your decision to quit.  Do not replace cigarette smoking with electronic cigarettes (commonly called e-cigarettes). The safety of e-cigarettes is unknown, and some may contain harmful chemicals.  If you relapse, do not give up! Plan ahead and think about what you will do the next time you get the urge to smoke. HOW WILL I FEEL WHEN I QUIT SMOKING? You may have symptoms of withdrawal because your body is used to nicotine (the addictive substance in cigarettes). You may crave cigarettes, be irritable, feel very hungry, cough often, get headaches, or have difficulty concentrating. The withdrawal symptoms are only temporary. They are strongest when you first quit but will go away within 10-14 days. When withdrawal symptoms occur, stay in control. Think about your reasons for quitting. Remind yourself that these are signs that your body is healing and getting used to being without cigarettes. Remember that withdrawal symptoms are easier to treat than the major diseases that smoking can cause.  Even after the withdrawal  is over, expect periodic urges to smoke. However, these cravings are generally short lived and will go away whether you smoke or not. Do not smoke! WHAT RESOURCES ARE AVAILABLE TO HELP ME QUIT SMOKING? Your health care provider can direct you to community resources or hospitals for support, which may include:  Group support.  Education.  Hypnosis.  Therapy. Document Released: 02/01/2004 Document Revised: 09/19/2013 Document Reviewed: 10/21/2012 [facility] Patient Information 2015 [place], [place]. This information is not intended to replace advice given to you by your health care provider. Make sure you discuss any questions you have with your health care provider.

## 2014-12-04 NOTE — Progress Notes (Signed)
Assessment and Plan:  Hypertension: Continue medication, monitor blood pressure at home. Continue DASH diet.  Reminder to go to the ER if any CP, SOB, nausea, dizziness, severe HA, changes vision/speech, left arm numbness and tingling, and jaw pain. Cholesterol: Continue diet and exercise. Check cholesterol.  Pre-diabetes-Continue diet and exercise. Check A1C Vitamin D Def- check level and continue medications.  Obesity with co morbidities- long discussion about weight loss, diet, and exercise CAD s/p stent- continue ASA but stop 7 days before procedure.  Surgical clearance- will suggest cardiac clearance since he only took blood thinner s/p stent for 6 months and has continued to smoke, high risk, it is reassuring that he has a physically active job without symptoms however with the surgery will suggest reevaluation.  Smoking cessation- Smoking cessation-  instruction/counseling given, counseled patient on the dangers of tobacco use, advised patient to stop smoking, and reviewed strategies to maximize success, declines prescription at this time.  Snoring- likely sleep apnea, ? Positional, will mention at preop.   Continue diet and meds as discussed. Further disposition pending results of labs.  HPI 49 y.o. smoking whit male  presents for 3 month follow up with hypertension, hyperlipidemia, prediabetes, CAD s/p stent  and vitamin D and surgical clearance.   His blood pressure has been controlled at home, today their BP is BP: 128/84 mmHg  He does not workout but will shovel mulch. He denies chest pain, shortness of breath, dizziness.  He has a history of ASHD S/P Stenting in 2010, states that the time he did not have insurance and was only on plavix x 6 months after, has continued to smoke,, no longer does drugs. He is on ASA.  Denies dyspnea, exertional chest pressure/discomfort and irregular heart beat.   He is on cholesterol medication and denies myalgias. His cholesterol is at goal. The  cholesterol last visit was:   Lab Results  Component Value Date   CHOL 120 06/19/2014   HDL 44 06/19/2014   LDLCALC 61 06/19/2014   TRIG 73 06/19/2014   CHOLHDL 2.7 06/19/2014   He has been working on diet and exercise for prediabetes, and denies paresthesia of the feet, polydipsia, polyuria and visual disturbances. Last A1C in the office was:  Lab Results  Component Value Date   HGBA1C 5.5 06/19/2014  Patient is on Vitamin D supplement.   Patient was having diarrhea, went to Dr. [name] and has been diagnosed with Crohn's disease and will follow with him in another 6 months.  He also follows with Dr. Risa Grill for left renal mass found during crohn's evaluation, had recent MRI with changes suggestive of renal carcinoma and is planning a left partial nephrectomy 01/17/2015. He continues to smoke but would like to quit. Has done patches in the past and will likely try that.  BMI is Body mass index is 33.98 kg/(m^2)., he is working on diet and exercise. Wt Readings from Last 3 Encounters:  12/04/14 233 lb 6.4 oz (105.87 kg)  09/15/14 237 lb (107.502 kg)  06/19/14 237 lb (107.502 kg)    Current Medications:  Current Outpatient Prescriptions on File Prior to Visit  Medication Sig Dispense Refill  . amphetamine-dextroamphetamine (ADDERALL) 20 MG tablet Take 1 tablet (20 mg total) by mouth 2 (two) times daily. 60 tablet 0  . aspirin 325 MG tablet Take 325 mg by mouth daily.    Marland Kitchen atorvastatin (LIPITOR) 80 MG tablet Take 1 tablet (80 mg total) by mouth daily. 90 tablet 0  . cetirizine (ZYRTEC)  10 MG tablet Take 10 mg by mouth daily.    . cyclobenzaprine (FLEXERIL) 10 MG tablet Take 1 tablet (10 mg total) by mouth every 8 (eight) hours as needed for muscle spasms. 60 tablet 1  . metoprolol (LOPRESSOR) 50 MG tablet Take 1 tablet (50 mg total) by mouth 2 (two) times daily. 180 tablet 0  . Multiple Vitamins-Minerals (OSTEO COMPLEX PO) Take 1 capsule by mouth daily.     No current  facility-administered medications on file prior to visit.   Medical History:  Past Medical History  Diagnosis Date  . Hyperlipidemia   . Hypertension   . ADD (attention deficit disorder)   . ASHD (arteriosclerotic heart disease)   . Morbid obesity   . Positive for macroalbuminuria   . Crohn's disease   . Myocardial infarction   . Appendicitis   . Colitis    Allergies: No Known Allergies   Review of Systems:  Review of Systems  Constitutional: Negative.   HENT: Negative.   Eyes: Negative.   Respiratory: Negative.   Cardiovascular: Negative.   Gastrointestinal: Negative.   Genitourinary: Negative.   Musculoskeletal: Negative.   Skin: Negative.   Neurological: Negative.   Endo/Heme/Allergies: Negative.   Psychiatric/Behavioral: Negative.     Family history- Review and unchanged Social history- Review and unchanged Physical Exam: BP 128/84 mmHg  Pulse 64  Temp(Src) 98.6 F (37 C)  Resp 16  Ht 5' 9.5" (1.765 m)  Wt 233 lb 6.4 oz (105.87 kg)  BMI 33.98 kg/m2 Wt Readings from Last 3 Encounters:  12/04/14 233 lb 6.4 oz (105.87 kg)  09/15/14 237 lb (107.502 kg)  06/19/14 237 lb (107.502 kg)   General Appearance: Well nourished, in no apparent distress. Eyes: PERRLA, EOMs, conjunctiva no swelling or erythema Sinuses: No Frontal/maxillary tenderness ENT/Mouth: Ext aud canals clear, TMs without erythema, bulging. No erythema, swelling, or exudate on post pharynx.  Tonsils not swollen or erythematous. Hearing normal.  Neck: Supple, thyroid normal.  Respiratory: Respiratory effort normal, BS equal bilaterally without rales, rhonchi, wheezing or stridor.  Cardio: RRR with no MRGs. Brisk peripheral pulses without edema.  Abdomen: Soft, + BS, obese,  Non tender, no guarding, rebound, hernias, masses. Lymphatics: Non tender without lymphadenopathy.  Musculoskeletal: Full ROM, 5/5 strength, Normal Skin: Warm, dry without rashes, lesions, ecchymosis.  Neuro: Cranial nerves  intact. Normal muscle tone, no cerebellar symptoms. Psych: Awake and oriented X 3, normal affect, Insight and Judgment appropriate.    Vicie Mutters, PA-C 4:25 PM [facility]

## 2014-12-05 LAB — LIPID PANEL
Cholesterol: 89 mg/dL (ref 0–200)
HDL: 30 mg/dL — AB (ref >=40)
LDL Cholesterol: 39 mg/dL (ref 0–99)
Total CHOL/HDL Ratio: 3 Ratio
Triglycerides: 100 mg/dL (ref <150)
VLDL: 20 mg/dL (ref 0–40)

## 2014-12-05 LAB — BASIC METABOLIC PANEL WITH GFR
BUN: 14 mg/dL (ref 6–23)
CO2: 22 mEq/L (ref 19–32)
Calcium: 9.3 mg/dL (ref 8.4–10.5)
Chloride: 106 mEq/L (ref 96–112)
Creat: 1.15 mg/dL (ref 0.50–1.35)
GFR, Est African American: 85 mL/min
GFR, Est Non African American: 74 mL/min
GLUCOSE: 92 mg/dL (ref 70–99)
POTASSIUM: 3.9 meq/L (ref 3.5–5.3)
Sodium: 140 mEq/L (ref 135–145)

## 2014-12-05 LAB — HEPATIC FUNCTION PANEL
ALT: 27 U/L (ref 0–53)
AST: 18 U/L (ref 0–37)
Albumin: 3.8 g/dL (ref 3.5–5.2)
Alkaline Phosphatase: 111 U/L (ref 39–117)
BILIRUBIN INDIRECT: 0.3 mg/dL (ref 0.2–1.2)
Bilirubin, Direct: 0.1 mg/dL (ref 0.0–0.3)
TOTAL PROTEIN: 7 g/dL (ref 6.0–8.3)
Total Bilirubin: 0.4 mg/dL (ref 0.2–1.2)

## 2014-12-05 LAB — VITAMIN D 25 HYDROXY (VIT D DEFICIENCY, FRACTURES): Vit D, 25-Hydroxy: 39 ng/mL (ref 30–100)

## 2014-12-05 LAB — MAGNESIUM: Magnesium: 1.9 mg/dL (ref 1.5–2.5)

## 2014-12-05 LAB — TSH: TSH: 1.158 u[IU]/mL (ref 0.350–4.500)

## 2014-12-19 ENCOUNTER — Ambulatory Visit (INDEPENDENT_AMBULATORY_CARE_PROVIDER_SITE_OTHER): Payer: Federal, State, Local not specified - PPO | Admitting: Cardiovascular Disease

## 2014-12-19 ENCOUNTER — Encounter: Payer: Self-pay | Admitting: Cardiovascular Disease

## 2014-12-19 VITALS — BP 108/74 | HR 56 | Ht 69.0 in | Wt 230.6 lb

## 2014-12-19 DIAGNOSIS — I2510 Atherosclerotic heart disease of native coronary artery without angina pectoris: Secondary | ICD-10-CM | POA: Diagnosis not present

## 2014-12-19 DIAGNOSIS — I10 Essential (primary) hypertension: Secondary | ICD-10-CM | POA: Diagnosis not present

## 2014-12-19 DIAGNOSIS — E785 Hyperlipidemia, unspecified: Secondary | ICD-10-CM

## 2014-12-19 DIAGNOSIS — Z720 Tobacco use: Secondary | ICD-10-CM

## 2014-12-19 DIAGNOSIS — F17200 Nicotine dependence, unspecified, uncomplicated: Secondary | ICD-10-CM

## 2014-12-19 NOTE — Progress Notes (Signed)
12/19/2014 Cole [name]   July 23, 1965  [id number]  Primary Physician [name],Cole DAVID, MD Primary Cardiologist: Lorretta Harp MD Renae Gloss   HPI:  Cole [name] is a very pleasant 49 year old moderately overweight married Caucasian male father of 2 children who is accompanied by his wife on today. He was referred for preoperative clearance before elective partial robotic left nephrectomy scheduled to be performed by Dr. Louis Meckel. His primary care physician is Dr. Melford Aase  His cardiac risk factor profile is notable for 35-50-pack-years of tobacco abuse continue to smoke one pack a day, treated hypertension, hyperlipidemia. There is no family history. He did have a myocardial infarction 5 years ago and was treated at [facility] by Dr. Darral Dash. He had a DES placed in his RCA radially. He currently denies chest pain or shortness of breath. He is scheduled to have an elective robotic left partial nephrectomy by Dr. Louis Meckel at [facility] and was referred here for preoperative clearance.   Current Outpatient Prescriptions  Medication Sig Dispense Refill  . amphetamine-dextroamphetamine (ADDERALL) 20 MG tablet Take 1 tablet (20 mg total) by mouth 2 (two) times daily. 60 tablet 0  . aspirin 325 MG tablet Take 325 mg by mouth daily.    Marland Kitchen atorvastatin (LIPITOR) 80 MG tablet Take 1 tablet (80 mg total) by mouth daily. 90 tablet 0  . cetirizine (ZYRTEC) 10 MG tablet Take 10 mg by mouth daily.    . cyclobenzaprine (FLEXERIL) 10 MG tablet Take 1 tablet (10 mg total) by mouth every 8 (eight) hours as needed for muscle spasms. 60 tablet 1  . metoprolol (LOPRESSOR) 50 MG tablet Take 1 tablet (50 mg total) by mouth 2 (two) times daily. 180 tablet 0  . Multiple Vitamins-Minerals (OSTEO COMPLEX PO) Take 1 capsule by mouth daily.     No current facility-administered medications for this visit.    No Known Allergies  History   Social History  . Marital Status:  Married    Spouse Name: N/A  . Number of Children: 2  . Years of Education: N/A   Occupational History  . Truck driver    Social History Main Topics  . Smoking status: Current Every Day Smoker  . Smokeless tobacco: Never Used  . Alcohol Use: No  . Drug Use: No  . Sexual Activity: Yes   Other Topics Concern  . Not on file   Social History Narrative     Review of Systems: General: negative for chills, fever, night sweats or weight changes.  Cardiovascular: negative for chest pain, dyspnea on exertion, edema, orthopnea, palpitations, paroxysmal nocturnal dyspnea or shortness of breath Dermatological: negative for rash Respiratory: negative for cough or wheezing Urologic: negative for hematuria Abdominal: negative for nausea, vomiting, diarrhea, bright red blood per rectum, melena, or hematemesis Neurologic: negative for visual changes, syncope, or dizziness All other systems reviewed and are otherwise negative except as noted above.    Blood pressure 108/74, pulse 56, height 5' 9"  (1.753 m), weight 230 lb 9.6 oz (104.599 kg).  General appearance: alert and no distress Neck: no adenopathy, no carotid bruit, no JVD, supple, symmetrical, trachea midline and thyroid not enlarged, symmetric, no tenderness/mass/nodules Lungs: clear to auscultation bilaterally Heart: regular rate and rhythm, S1, S2 normal, no murmur, click, rub or gallop Extremities: extremities normal, atraumatic, no cyanosis or edema  EKG sinus bradycardia 57 with an RV conduction delay and inferior Q waves. I personally reviewed this EKG  ASSESSMENT AND PLAN:   Smoker  History of 40-60 pack years of tobacco abuse currently smoking a pack a day recalcitrant to risk factor modification  Hypertension History of hypertension with blood pressure measured today at 108/74. He is on metoprolol. Continue current meds at current dosing  Hyperlipidemia History of hyperlipidemia on atorvastatin 80 mg a day with recent  lipid profile performed 12/04/14 revealing a total cholesterol of 89, LDL 39 and HDL of 30  ASHD (arteriosclerotic heart disease) History of CAD status post myocardial infarction 2011with RCA PCI and stenting using drug-eluting stent performed radially by Dr. Darral Dash at [facility]. He denies chest pain or shortness of breath.      Lorretta Harp MD FACP,FACC,FAHA, [facility] 12/19/2014 11:42 AM

## 2014-12-19 NOTE — Assessment & Plan Note (Signed)
History of CAD status post myocardial infarction 2011with RCA PCI and stenting using drug-eluting stent performed radially by Dr. Darral Dash at [facility]. He denies chest pain or shortness of breath.

## 2014-12-19 NOTE — Assessment & Plan Note (Signed)
History of hypertension with blood pressure measured today at 108/74. He is on metoprolol. Continue current meds at current dosing

## 2014-12-19 NOTE — Assessment & Plan Note (Signed)
History of hyperlipidemia on atorvastatin 80 mg a day with recent lipid profile performed 12/04/14 revealing a total cholesterol of 89, LDL 39 and HDL of 30

## 2014-12-19 NOTE — Assessment & Plan Note (Signed)
History of 40-60 pack years of tobacco abuse currently smoking a pack a day recalcitrant to risk factor modification

## 2014-12-19 NOTE — Patient Instructions (Signed)
  We will see you back in follow up only as needed.   Dr Gwenlyn Found has ordered: 1.  Echocardiogram. Echocardiography is a painless test that uses sound waves to create images of your heart. It provides your doctor with information about the size and shape of your heart and how well your heart's chambers and valves are working. This procedure takes approximately one hour. There are no restrictions for this procedure.

## 2014-12-25 ENCOUNTER — Ambulatory Visit (HOSPITAL_COMMUNITY): Payer: Federal, State, Local not specified - PPO | Attending: Cardiovascular Disease

## 2014-12-25 ENCOUNTER — Other Ambulatory Visit: Payer: Self-pay

## 2014-12-25 ENCOUNTER — Other Ambulatory Visit: Payer: Self-pay | Admitting: Physician Assistant

## 2014-12-25 DIAGNOSIS — F17200 Nicotine dependence, unspecified, uncomplicated: Secondary | ICD-10-CM | POA: Diagnosis not present

## 2014-12-25 DIAGNOSIS — I2510 Atherosclerotic heart disease of native coronary artery without angina pectoris: Secondary | ICD-10-CM

## 2014-12-25 DIAGNOSIS — I10 Essential (primary) hypertension: Secondary | ICD-10-CM | POA: Diagnosis not present

## 2014-12-25 DIAGNOSIS — I071 Rheumatic tricuspid insufficiency: Secondary | ICD-10-CM | POA: Diagnosis not present

## 2014-12-25 DIAGNOSIS — I340 Nonrheumatic mitral (valve) insufficiency: Secondary | ICD-10-CM | POA: Insufficient documentation

## 2014-12-25 MED ORDER — ATORVASTATIN CALCIUM 80 MG PO TABS: 80.0000 mg | ORAL_TABLET | Freq: Every day | ORAL | Status: DC

## 2014-12-25 MED ORDER — METOPROLOL TARTRATE 50 MG PO TABS: 50.0000 mg | ORAL_TABLET | Freq: Two times a day (BID) | ORAL | Status: DC

## 2014-12-26 ENCOUNTER — Encounter: Payer: Self-pay | Admitting: Physician Assistant

## 2015-01-05 ENCOUNTER — Other Ambulatory Visit: Payer: Self-pay | Admitting: Physician Assistant

## 2015-01-05 MED ORDER — CYCLOBENZAPRINE HCL 10 MG PO TABS: 10.0000 mg | ORAL_TABLET | Freq: Three times a day (TID) | ORAL | Status: DC | PRN

## 2015-01-08 NOTE — Progress Notes (Signed)
lov dr berry 12-19-14 epic Echo 12-25-14 epic ekg 12-19-14 epic

## 2015-01-08 NOTE — Patient Instructions (Addendum)
Cole [name]  01/08/2015   Your procedure is scheduled on: Wednesday August 31st, 2016  Report to [facility] Main  Entrance take [place]  elevators to 3rd floor to   [place] at 530 AM.  Call this number if you have problems the morning of surgery [phone]   Remember: ONLY 1 PERSON MAY GO WITH YOU TO SHORT STAY TO GET  READY MORNING OF [place].  Do not eat food  :After Midnight Monday night, clear liquids all day Tuesday 01-16-15, no clear liquids after midnight Tuesday night.   FOLLOW ALL BOWEL PREP INSTRUCTIONS FROM DR [facility] OFFICE.   Take these medicines the morning of surgery with A SIP OF WATER:  metoprolol,  zyrtec                               You may not have any metal on your body including hair pins and              piercings  Do not wear jewelry, make-up, lotions, powders or perfumes, deodorant             Do not wear nail polish.  Do not shave  48 hours prior to surgery.              Men may shave face and neck.   Do not bring valuables to the hospital. [place].  Contacts, dentures or bridgework may not be worn into surgery.  Leave suitcase in the car. After surgery it may be brought to your room.     Patients discharged the day of surgery will not be allowed to drive home.  Name and phone number of your driver:  Special Instructions: N/A              Please read over the following fact sheets you were given: _____________________________________________________________________             [facility] - Preparing for Surgery Before surgery, you can play an important role.  Because skin is not sterile, your skin needs to be as free of germs as possible.  You can reduce the number of germs on your skin by washing with CHG (chlorahexidine gluconate) soap before surgery.  CHG is an antiseptic cleaner which kills germs and bonds with the skin to continue killing germs even after  washing. Please DO NOT use if you have an allergy to CHG or antibacterial soaps.  If your skin becomes reddened/irritated stop using the CHG and inform your nurse when you arrive at Short Stay. Do not shave (including legs and underarms) for at least 48 hours prior to the first CHG shower.  You may shave your face/neck. Please follow these instructions carefully:  1.  Shower with CHG Soap the night before surgery and the  morning of Surgery.  2.  If you choose to wash your hair, wash your hair first as usual with your  normal  shampoo.  3.  After you shampoo, rinse your hair and body thoroughly to remove the  shampoo.                           4.  Use CHG as you would any other liquid  soap.  You can apply chg directly  to the skin and wash                       Gently with a scrungie or clean washcloth.  5.  Apply the CHG Soap to your body ONLY FROM THE NECK DOWN.   Do not use on face/ open                           Wound or open sores. Avoid contact with eyes, ears mouth and genitals (private parts).                       Wash face,  Genitals (private parts) with your normal soap.             6.  Wash thoroughly, paying special attention to the area where your surgery  will be performed.  7.  Thoroughly rinse your body with warm water from the neck down.  8.  DO NOT shower/wash with your normal soap after using and rinsing off  the CHG Soap.                9.  Pat yourself dry with a clean towel.            10.  Wear clean pajamas.            11.  Place clean sheets on your bed the night of your first shower and do not  sleep with pets. Day of Surgery : Do not apply any lotions/deodorants the morning of surgery.  Please wear clean clothes to the hospital/surgery center.  FAILURE TO FOLLOW THESE INSTRUCTIONS MAY RESULT IN THE CANCELLATION OF YOUR SURGERY PATIENT SIGNATURE_________________________________  NURSE  SIGNATURE__________________________________  ________________________________________________________________________  WHAT IS A BLOOD TRANSFUSION? Blood Transfusion Information  A transfusion is the replacement of blood or some of its parts. Blood is made up of multiple cells which provide different functions.  Red blood cells carry oxygen and are used for blood loss replacement.  White blood cells fight against infection.  Platelets control bleeding.  Plasma helps clot blood.  Other blood products are available for specialized needs, such as hemophilia or other clotting disorders. BEFORE THE TRANSFUSION  Who gives blood for transfusions?   Healthy volunteers who are fully evaluated to make sure their blood is safe. This is blood bank blood. Transfusion therapy is the safest it has ever been in the practice of medicine. Before blood is taken from a donor, a complete history is taken to make sure that person has no history of diseases nor engages in risky social behavior (examples are intravenous drug use or sexual activity with multiple partners). The donor's travel history is screened to minimize risk of transmitting infections, such as malaria. The donated blood is tested for signs of infectious diseases, such as HIV and hepatitis. The blood is then tested to be sure it is compatible with you in order to minimize the chance of a transfusion reaction. If you or a relative donates blood, this is often done in anticipation of surgery and is not appropriate for emergency situations. It takes many days to process the donated blood. RISKS AND COMPLICATIONS Although transfusion therapy is very safe and saves many lives, the main dangers of transfusion include:   Getting an infectious disease.  Developing a transfusion reaction. This is an allergic reaction to something in the blood you  were given. Every precaution is taken to prevent this. The decision to have a blood transfusion has been  considered carefully by your caregiver before blood is given. Blood is not given unless the benefits outweigh the risks. AFTER THE TRANSFUSION  Right after receiving a blood transfusion, you will usually feel much better and more energetic. This is especially true if your red blood cells have gotten low (anemic). The transfusion raises the level of the red blood cells which carry oxygen, and this usually causes an energy increase.  The nurse administering the transfusion will monitor you carefully for complications. HOME CARE INSTRUCTIONS  No special instructions are needed after a transfusion. You may find your energy is better. Speak with your caregiver about any limitations on activity for underlying diseases you may have. SEEK MEDICAL CARE IF:   Your condition is not improving after your transfusion.  You develop redness or irritation at the intravenous (IV) site. SEEK IMMEDIATE MEDICAL CARE IF:  Any of the following symptoms occur over the next 12 hours:  Shaking chills.  You have a temperature by mouth above 102 F (38.9 C), not controlled by medicine.  Chest, back, or muscle pain.  People around you feel you are not acting correctly or are confused.  Shortness of breath or difficulty breathing.  Dizziness and fainting.  You get a rash or develop hives.  You have a decrease in urine output.  Your urine turns a dark color or changes to pink, red, or brown. Any of the following symptoms occur over the next 10 days:  You have a temperature by mouth above 102 F (38.9 C), not controlled by medicine.  Shortness of breath.  Weakness after normal activity.  The white part of the eye turns yellow (jaundice).  You have a decrease in the amount of urine or are urinating less often.  Your urine turns a dark color or changes to pink, red, or brown. Document Released: 05/02/2000 Document Revised: 07/28/2011 Document Reviewed: 12/20/2007 [facility] Patient Information 2014  [place], [place].  _______________________________________________________________________

## 2015-01-09 ENCOUNTER — Encounter (HOSPITAL_COMMUNITY): Payer: Self-pay

## 2015-01-09 ENCOUNTER — Encounter (HOSPITAL_COMMUNITY)
Admission: RE | Admit: 2015-01-09 | Discharge: 2015-01-09 | Disposition: A | Discharge status: Home / Self Care | Payer: Federal, State, Local not specified - PPO | Source: Ambulatory Visit | Attending: Urology | Admitting: Urology

## 2015-01-09 DIAGNOSIS — I2510 Atherosclerotic heart disease of native coronary artery without angina pectoris: Secondary | ICD-10-CM | POA: Insufficient documentation

## 2015-01-09 DIAGNOSIS — K5000 Crohn's disease of small intestine without complications: Secondary | ICD-10-CM | POA: Insufficient documentation

## 2015-01-09 DIAGNOSIS — N2889 Other specified disorders of kidney and ureter: Secondary | ICD-10-CM | POA: Insufficient documentation

## 2015-01-09 DIAGNOSIS — I10 Essential (primary) hypertension: Secondary | ICD-10-CM | POA: Insufficient documentation

## 2015-01-09 DIAGNOSIS — Z01812 Encounter for preprocedural laboratory examination: Secondary | ICD-10-CM | POA: Diagnosis present

## 2015-01-09 DIAGNOSIS — E785 Hyperlipidemia, unspecified: Secondary | ICD-10-CM | POA: Diagnosis not present

## 2015-01-09 DIAGNOSIS — F988 Other specified behavioral and emotional disorders with onset usually occurring in childhood and adolescence: Secondary | ICD-10-CM | POA: Diagnosis not present

## 2015-01-09 DIAGNOSIS — E559 Vitamin D deficiency, unspecified: Secondary | ICD-10-CM | POA: Diagnosis not present

## 2015-01-09 HISTORY — DX: Other specified disorders of kidney and ureter: N28.89

## 2015-01-09 HISTORY — DX: Dermatitis, unspecified: L30.9

## 2015-01-09 HISTORY — DX: Gastro-esophageal reflux disease without esophagitis: K21.9

## 2015-01-09 LAB — COMPREHENSIVE METABOLIC PANEL
ALT: 25 U/L (ref 17–63)
AST: 20 U/L (ref 15–41)
Albumin: 4 g/dL (ref 3.5–5.0)
Alkaline Phosphatase: 115 U/L (ref 38–126)
Anion gap: 6 (ref 5–15)
BUN: 18 mg/dL (ref 6–20)
CO2: 26 mmol/L (ref 22–32)
Calcium: 9.2 mg/dL (ref 8.9–10.3)
Chloride: 108 mmol/L (ref 101–111)
Creatinine, Ser: 0.85 mg/dL (ref 0.61–1.24)
GFR calc Af Amer: >60 mL/min (ref >60)
GFR calc non Af Amer: >60 mL/min (ref >60)
Glucose, Bld: 85 mg/dL (ref 65–99)
Potassium: 5.1 mmol/L (ref 3.5–5.1)
Sodium: 140 mmol/L (ref 135–145)
Total Bilirubin: 0.5 mg/dL (ref 0.3–1.2)
Total Protein: 8.1 g/dL (ref 6.5–8.1)

## 2015-01-09 LAB — CBC
HCT: 42.4 % (ref 39.0–52.0)
Hemoglobin: 14.2 g/dL (ref 13.0–17.0)
MCH: 30.9 pg (ref 26.0–34.0)
MCHC: 33.5 g/dL (ref 30.0–36.0)
MCV: 92.2 fL (ref 78.0–100.0)
Platelets: 284 10*3/uL (ref 150–400)
RBC: 4.6 MIL/uL (ref 4.22–5.81)
RDW: 13.5 % (ref 11.5–15.5)
WBC: 9.2 10*3/uL (ref 4.0–10.5)

## 2015-01-09 LAB — ABO/RH: ABO/RH(D): B POS

## 2015-01-09 NOTE — Progress Notes (Signed)
   01/09/15 6886  OBSTRUCTIVE SLEEP APNEA  Have you ever been diagnosed with sleep apnea through a sleep study? No  Do you snore loudly (loud enough to be heard through closed doors)?  1  Do you often feel tired, fatigued, or sleepy during the daytime? 0  Has anyone observed you stop breathing during your sleep? 0  Do you have, or are you being treated for high blood pressure? 1  BMI more than 35 kg/m2? 0  Age over 50 years old? 1  Neck circumference greater than 40 cm/16 inches? 0  Gender: 1

## 2015-01-10 LAB — URINE CULTURE: CULTURE: NO GROWTH

## 2015-01-11 ENCOUNTER — Encounter: Payer: Self-pay | Admitting: *Deleted

## 2015-01-17 ENCOUNTER — Inpatient Hospital Stay (HOSPITAL_COMMUNITY): Payer: Federal, State, Local not specified - PPO | Admitting: Certified Registered Nurse Anesthetist

## 2015-01-17 ENCOUNTER — Encounter (HOSPITAL_COMMUNITY): Payer: Self-pay | Admitting: *Deleted

## 2015-01-17 ENCOUNTER — Encounter (HOSPITAL_COMMUNITY)
Admission: RE | Disposition: A | Discharge status: Home / Self Care | Payer: Self-pay | Source: Ambulatory Visit | Attending: Urology

## 2015-01-17 ENCOUNTER — Inpatient Hospital Stay (HOSPITAL_COMMUNITY)
Admission: RE | Admit: 2015-01-17 | Discharge: 2015-01-18 | DRG: 658 | Disposition: A | Discharge status: Home / Self Care | Payer: Federal, State, Local not specified - PPO | Source: Ambulatory Visit | Attending: Urology | Admitting: Urology

## 2015-01-17 ENCOUNTER — Other Ambulatory Visit: Payer: Self-pay | Admitting: Internal Medicine

## 2015-01-17 DIAGNOSIS — I10 Essential (primary) hypertension: Secondary | ICD-10-CM | POA: Diagnosis present

## 2015-01-17 DIAGNOSIS — E785 Hyperlipidemia, unspecified: Secondary | ICD-10-CM | POA: Diagnosis present

## 2015-01-17 DIAGNOSIS — Z79899 Other long term (current) drug therapy: Secondary | ICD-10-CM

## 2015-01-17 DIAGNOSIS — Z9049 Acquired absence of other specified parts of digestive tract: Secondary | ICD-10-CM | POA: Diagnosis present

## 2015-01-17 DIAGNOSIS — C642 Malignant neoplasm of left kidney, except renal pelvis: Secondary | ICD-10-CM | POA: Diagnosis present

## 2015-01-17 DIAGNOSIS — Z8249 Family history of ischemic heart disease and other diseases of the circulatory system: Secondary | ICD-10-CM | POA: Diagnosis not present

## 2015-01-17 DIAGNOSIS — I252 Old myocardial infarction: Secondary | ICD-10-CM | POA: Diagnosis not present

## 2015-01-17 DIAGNOSIS — N2889 Other specified disorders of kidney and ureter: Secondary | ICD-10-CM | POA: Diagnosis present

## 2015-01-17 DIAGNOSIS — I2510 Atherosclerotic heart disease of native coronary artery without angina pectoris: Secondary | ICD-10-CM | POA: Diagnosis present

## 2015-01-17 DIAGNOSIS — Z01812 Encounter for preprocedural laboratory examination: Secondary | ICD-10-CM | POA: Diagnosis not present

## 2015-01-17 DIAGNOSIS — Z7982 Long term (current) use of aspirin: Secondary | ICD-10-CM | POA: Diagnosis not present

## 2015-01-17 HISTORY — PX: ROBOTIC ASSITED PARTIAL NEPHRECTOMY: SHX6087

## 2015-01-17 LAB — CBC
HCT: 38.7 % — ABNORMAL LOW (ref 39.0–52.0)
Hemoglobin: 12.9 g/dL — ABNORMAL LOW (ref 13.0–17.0)
MCH: 30.7 pg (ref 26.0–34.0)
MCHC: 33.3 g/dL (ref 30.0–36.0)
MCV: 92.1 fL (ref 78.0–100.0)
PLATELETS: 220 10*3/uL (ref 150–400)
RBC: 4.2 MIL/uL — AB (ref 4.22–5.81)
RDW: 13.6 % (ref 11.5–15.5)
WBC: 10.8 10*3/uL — ABNORMAL HIGH (ref 4.0–10.5)

## 2015-01-17 LAB — TYPE AND SCREEN
ABO/RH(D): B POS
Antibody Screen: NEGATIVE

## 2015-01-17 LAB — BASIC METABOLIC PANEL
Anion gap: 4 — ABNORMAL LOW (ref 5–15)
BUN: 10 mg/dL (ref 6–20)
CO2: 26 mmol/L (ref 22–32)
CREATININE: 0.91 mg/dL (ref 0.61–1.24)
Calcium: 8.1 mg/dL — ABNORMAL LOW (ref 8.9–10.3)
Chloride: 106 mmol/L (ref 101–111)
GFR calc Af Amer: >60 mL/min (ref >60)
GLUCOSE: 128 mg/dL — AB (ref 65–99)
POTASSIUM: 4 mmol/L (ref 3.5–5.1)
Sodium: 136 mmol/L (ref 135–145)

## 2015-01-17 SURGERY — ROBOTIC ASSITED PARTIAL NEPHRECTOMY
Anesthesia: General | Laterality: Left

## 2015-01-17 MED ORDER — LACTATED RINGERS IV SOLN
INTRAVENOUS | Status: DC | PRN
  Administered 2015-01-17 (×3): via INTRAVENOUS

## 2015-01-17 MED ORDER — STERILE WATER FOR IRRIGATION IR SOLN
Status: DC | PRN
  Administered 2015-01-17: 1000 mL

## 2015-01-17 MED ORDER — GLYCOPYRROLATE 0.2 MG/ML IJ SOLN
INTRAMUSCULAR | Status: AC
  Filled 2015-01-17: qty 1

## 2015-01-17 MED ORDER — LACTATED RINGERS IV SOLN: INTRAVENOUS | Status: DC

## 2015-01-17 MED ORDER — NEOSTIGMINE METHYLSULFATE 10 MG/10ML IV SOLN
INTRAVENOUS | Status: AC
  Filled 2015-01-17: qty 1

## 2015-01-17 MED ORDER — DEXAMETHASONE SODIUM PHOSPHATE 10 MG/ML IJ SOLN
INTRAMUSCULAR | Status: AC
  Filled 2015-01-17: qty 1

## 2015-01-17 MED ORDER — ACETAMINOPHEN 10 MG/ML IV SOLN
1000.0000 mg | Freq: Four times a day (QID) | INTRAVENOUS | Status: DC
  Administered 2015-01-17 – 2015-01-18 (×3): 1000 mg via INTRAVENOUS
  Filled 2015-01-17 (×4): qty 100

## 2015-01-17 MED ORDER — FENTANYL CITRATE (PF) 250 MCG/5ML IJ SOLN
INTRAMUSCULAR | Status: AC
  Filled 2015-01-17: qty 25

## 2015-01-17 MED ORDER — HYDROMORPHONE HCL 1 MG/ML IJ SOLN
0.2500 mg | INTRAMUSCULAR | Status: DC | PRN
  Administered 2015-01-17: 0.5 mg via INTRAVENOUS

## 2015-01-17 MED ORDER — ROCURONIUM BROMIDE 100 MG/10ML IV SOLN
INTRAVENOUS | Status: AC
  Filled 2015-01-17: qty 1

## 2015-01-17 MED ORDER — MEPERIDINE HCL 50 MG/ML IJ SOLN
6.2500 mg | INTRAMUSCULAR | Status: DC | PRN
Start: 2015-01-17 — End: 2015-01-17

## 2015-01-17 MED ORDER — CYCLOBENZAPRINE HCL 10 MG PO TABS: 10.0000 mg | ORAL_TABLET | Freq: Three times a day (TID) | ORAL | Status: DC | PRN

## 2015-01-17 MED ORDER — BUPIVACAINE-EPINEPHRINE (PF) 0.25% -1:200000 IJ SOLN
INTRAMUSCULAR | Status: DC | PRN
  Administered 2015-01-17: 10 mL via PERINEURAL

## 2015-01-17 MED ORDER — MIDAZOLAM HCL 2 MG/2ML IJ SOLN
INTRAMUSCULAR | Status: AC
  Filled 2015-01-17: qty 4

## 2015-01-17 MED ORDER — SENNOSIDES-DOCUSATE SODIUM 8.6-50 MG PO TABS
2.0000 | ORAL_TABLET | Freq: Every day | ORAL | Status: DC
  Administered 2015-01-17: 2 via ORAL
  Filled 2015-01-17: qty 2

## 2015-01-17 MED ORDER — PROPOFOL 10 MG/ML IV BOLUS
INTRAVENOUS | Status: AC
  Filled 2015-01-17: qty 20

## 2015-01-17 MED ORDER — MIDAZOLAM HCL 5 MG/5ML IJ SOLN
INTRAMUSCULAR | Status: DC | PRN
  Administered 2015-01-17 (×2): 1 mg via INTRAVENOUS

## 2015-01-17 MED ORDER — BUPIVACAINE-EPINEPHRINE 0.25% -1:200000 IJ SOLN
INTRAMUSCULAR | Status: AC
  Filled 2015-01-17: qty 1

## 2015-01-17 MED ORDER — LORATADINE 10 MG PO TABS
10.0000 mg | ORAL_TABLET | Freq: Every day | ORAL | Status: DC
  Filled 2015-01-17 (×2): qty 1

## 2015-01-17 MED ORDER — ONDANSETRON HCL 4 MG/2ML IJ SOLN
INTRAMUSCULAR | Status: AC
  Filled 2015-01-17: qty 2

## 2015-01-17 MED ORDER — CIPROFLOXACIN IN D5W 400 MG/200ML IV SOLN
400.0000 mg | INTRAVENOUS | Status: AC
  Administered 2015-01-17: 400 mg via INTRAVENOUS

## 2015-01-17 MED ORDER — METOPROLOL TARTRATE 50 MG PO TABS
50.0000 mg | ORAL_TABLET | Freq: Two times a day (BID) | ORAL | Status: DC
  Administered 2015-01-17: 50 mg via ORAL
  Filled 2015-01-17 (×2): qty 1

## 2015-01-17 MED ORDER — GLYCOPYRROLATE 0.2 MG/ML IJ SOLN
INTRAMUSCULAR | Status: DC | PRN
  Administered 2015-01-17: 0.6 mg via INTRAVENOUS
  Administered 2015-01-17: .1 mg via INTRAVENOUS

## 2015-01-17 MED ORDER — HYDROMORPHONE HCL 1 MG/ML IJ SOLN
INTRAMUSCULAR | Status: AC
  Filled 2015-01-17: qty 1

## 2015-01-17 MED ORDER — MANNITOL 25 % IV SOLN
INTRAVENOUS | Status: DC | PRN
  Administered 2015-01-17 (×2): 12.5 g via INTRAVENOUS

## 2015-01-17 MED ORDER — CIPROFLOXACIN IN D5W 400 MG/200ML IV SOLN
INTRAVENOUS | Status: AC
  Filled 2015-01-17: qty 200

## 2015-01-17 MED ORDER — ONDANSETRON HCL 4 MG/2ML IJ SOLN
INTRAMUSCULAR | Status: DC | PRN
  Administered 2015-01-17: 4 mg via INTRAVENOUS

## 2015-01-17 MED ORDER — ONDANSETRON HCL 4 MG/2ML IJ SOLN
4.0000 mg | INTRAMUSCULAR | Status: DC | PRN
  Administered 2015-01-17: 4 mg via INTRAVENOUS
  Filled 2015-01-17: qty 2

## 2015-01-17 MED ORDER — DEXAMETHASONE SODIUM PHOSPHATE 10 MG/ML IJ SOLN
INTRAMUSCULAR | Status: DC | PRN
  Administered 2015-01-17: 10 mg via INTRAVENOUS

## 2015-01-17 MED ORDER — HYDROCODONE-ACETAMINOPHEN 5-325 MG PO TABS: 1.0000 | ORAL_TABLET | Freq: Four times a day (QID) | ORAL | Status: DC | PRN

## 2015-01-17 MED ORDER — PROMETHAZINE HCL 25 MG/ML IJ SOLN: 6.2500 mg | INTRAMUSCULAR | Status: DC | PRN

## 2015-01-17 MED ORDER — ROCURONIUM BROMIDE 100 MG/10ML IV SOLN
INTRAVENOUS | Status: DC | PRN
  Administered 2015-01-17: 50 mg via INTRAVENOUS
  Administered 2015-01-17 (×3): 10 mg via INTRAVENOUS

## 2015-01-17 MED ORDER — SODIUM CHLORIDE 0.9 % IJ SOLN
INTRAMUSCULAR | Status: AC
  Filled 2015-01-17: qty 20

## 2015-01-17 MED ORDER — BUPIVACAINE-EPINEPHRINE (PF) 0.25% -1:200000 IJ SOLN
INTRAMUSCULAR | Status: AC
  Filled 2015-01-17: qty 30

## 2015-01-17 MED ORDER — MORPHINE SULFATE (PF) 2 MG/ML IV SOLN: 2.0000 mg | INTRAVENOUS | Status: DC | PRN

## 2015-01-17 MED ORDER — GLYCOPYRROLATE 0.2 MG/ML IJ SOLN
INTRAMUSCULAR | Status: AC
  Filled 2015-01-17: qty 3

## 2015-01-17 MED ORDER — LIDOCAINE HCL (CARDIAC) 20 MG/ML IV SOLN
INTRAVENOUS | Status: AC
  Filled 2015-01-17: qty 5

## 2015-01-17 MED ORDER — DEXTROSE-NACL 5-0.45 % IV SOLN
INTRAVENOUS | Status: DC
  Administered 2015-01-17: 13:00:00 via INTRAVENOUS

## 2015-01-17 MED ORDER — LIDOCAINE HCL (CARDIAC) 20 MG/ML IV SOLN
INTRAVENOUS | Status: DC | PRN
  Administered 2015-01-17: 75 mg via INTRAVENOUS

## 2015-01-17 MED ORDER — ATORVASTATIN CALCIUM 40 MG PO TABS
80.0000 mg | ORAL_TABLET | Freq: Every day | ORAL | Status: DC
  Administered 2015-01-17 – 2015-01-18 (×2): 80 mg via ORAL
  Filled 2015-01-17 (×2): qty 2

## 2015-01-17 MED ORDER — CEFAZOLIN SODIUM 1-5 GM-% IV SOLN
1.0000 g | Freq: Three times a day (TID) | INTRAVENOUS | Status: AC
  Administered 2015-01-17 (×2): 1 g via INTRAVENOUS
  Filled 2015-01-17 (×2): qty 50

## 2015-01-17 MED ORDER — BUPIVACAINE HCL (PF) 0.25 % IJ SOLN
INTRAMUSCULAR | Status: AC
  Filled 2015-01-17: qty 30

## 2015-01-17 MED ORDER — PROPOFOL 10 MG/ML IV BOLUS
INTRAVENOUS | Status: DC | PRN
  Administered 2015-01-17: 175 mg via INTRAVENOUS

## 2015-01-17 MED ORDER — FENTANYL CITRATE (PF) 100 MCG/2ML IJ SOLN
INTRAMUSCULAR | Status: DC | PRN
  Administered 2015-01-17 (×8): 50 ug via INTRAVENOUS

## 2015-01-17 MED ORDER — ACETAMINOPHEN 325 MG PO TABS: 650.0000 mg | ORAL_TABLET | ORAL | Status: DC | PRN

## 2015-01-17 MED ORDER — LACTATED RINGERS IR SOLN
Status: DC | PRN
  Administered 2015-01-17: 1000 mL

## 2015-01-17 MED ORDER — HYDROCODONE-ACETAMINOPHEN 5-325 MG PO TABS
1.0000 | ORAL_TABLET | ORAL | Status: DC | PRN
  Administered 2015-01-17: 2 via ORAL
  Administered 2015-01-17: 1 via ORAL
  Filled 2015-01-17: qty 1
  Filled 2015-01-17: qty 2

## 2015-01-17 MED ORDER — BUPIVACAINE LIPOSOME 1.3 % IJ SUSP
20.0000 mL | Freq: Once | INTRAMUSCULAR | Status: AC
  Administered 2015-01-17: 20 mL
  Filled 2015-01-17: qty 20

## 2015-01-17 MED ORDER — NEOSTIGMINE METHYLSULFATE 10 MG/10ML IV SOLN
INTRAVENOUS | Status: DC | PRN
  Administered 2015-01-17: 5 mg via INTRAVENOUS

## 2015-01-17 MED ORDER — CEFAZOLIN SODIUM-DEXTROSE 2-3 GM-% IV SOLR
INTRAVENOUS | Status: AC
  Filled 2015-01-17: qty 50

## 2015-01-17 MED ORDER — CEFAZOLIN SODIUM-DEXTROSE 2-3 GM-% IV SOLR
2.0000 g | INTRAVENOUS | Status: AC
  Administered 2015-01-17: 2 g via INTRAVENOUS

## 2015-01-17 SURGICAL SUPPLY — 57 items
APPLICATOR SURGIFLO ENDO (HEMOSTASIS) IMPLANT
CHLORAPREP W/TINT 26ML (MISCELLANEOUS) ×3 IMPLANT
CLIP LIGATING HEM O LOK PURPLE (MISCELLANEOUS) ×3 IMPLANT
CLIP LIGATING HEMO LOK XL GOLD (MISCELLANEOUS) IMPLANT
CLIP LIGATING HEMO O LOK GREEN (MISCELLANEOUS) ×6 IMPLANT
COVER SURGICAL LIGHT HANDLE (MISCELLANEOUS) ×3 IMPLANT
COVER TIP SHEARS 8 DVNC (MISCELLANEOUS) ×1 IMPLANT
COVER TIP SHEARS 8MM DA VINCI (MISCELLANEOUS) ×2
DECANTER SPIKE VIAL GLASS SM (MISCELLANEOUS) ×3 IMPLANT
DRAIN CHANNEL 15F RND FF 3/16 (WOUND CARE) ×3 IMPLANT
DRAPE INCISE IOBAN 66X45 STRL (DRAPES) ×6 IMPLANT
DRAPE LAPAROSCOPIC ABDOMINAL (DRAPES) ×3 IMPLANT
DRAPE SHEET LG 3/4 BI-LAMINATE (DRAPES) ×3 IMPLANT
ELECT PENCIL ROCKER SW 15FT (MISCELLANEOUS) ×3 IMPLANT
ELECT REM PT RETURN 9FT ADLT (ELECTROSURGICAL) ×6
ELECTRODE REM PT RTRN 9FT ADLT (ELECTROSURGICAL) ×2 IMPLANT
EVACUATOR SILICONE 100CC (DRAIN) ×3 IMPLANT
GLOVE BIO SURGEON STRL SZ 6.5 (GLOVE) ×2 IMPLANT
GLOVE BIO SURGEONS STRL SZ 6.5 (GLOVE) ×1
GLOVE BIOGEL M STRL SZ7.5 (GLOVE) ×6 IMPLANT
GLOVE SS BIOGEL STRL SZ 7.5 (GLOVE) ×1 IMPLANT
GLOVE SUPERSENSE BIOGEL SZ 7.5 (GLOVE) ×2
GOWN STRL REUS W/TWL LRG LVL3 (GOWN DISPOSABLE) ×9 IMPLANT
GOWN STRL REUS W/TWL XL LVL3 (GOWN DISPOSABLE) ×3 IMPLANT
KIT ACCESSORY DA VINCI DISP (KITS) ×2
KIT ACCESSORY DVNC DISP (KITS) ×1 IMPLANT
KIT BASIN OR (CUSTOM PROCEDURE TRAY) ×3 IMPLANT
LIQUID BAND (GAUZE/BANDAGES/DRESSINGS) ×3 IMPLANT
LOOP VESSEL MAXI BLUE (MISCELLANEOUS) ×3 IMPLANT
MARKER SKIN DUAL TIP RULER LAB (MISCELLANEOUS) ×3 IMPLANT
NEEDLE INSUFFLATION 14GA 120MM (NEEDLE) ×3 IMPLANT
NS IRRIG 1000ML POUR BTL (IV SOLUTION) ×3 IMPLANT
POSITIONER SURGICAL ARM (MISCELLANEOUS) ×6 IMPLANT
POUCH SPECIMEN RETRIEVAL 10MM (ENDOMECHANICALS) ×3 IMPLANT
SET TUBE IRRIG SUCTION NO TIP (IRRIGATION / IRRIGATOR) IMPLANT
SOLUTION ELECTROLUBE (MISCELLANEOUS) ×3 IMPLANT
SPONGE LAP 4X18 X RAY DECT (DISPOSABLE) ×3 IMPLANT
SURGIFLO W/THROMBIN 8M KIT (HEMOSTASIS) ×3 IMPLANT
SUT ETHILON 3 0 PS 1 (SUTURE) ×3 IMPLANT
SUT MNCRL AB 4-0 PS2 18 (SUTURE) ×6 IMPLANT
SUT PDS AB 1 CT1 27 (SUTURE) ×3 IMPLANT
SUT V-LOC BARB 180 2/0GR6 GS22 (SUTURE) ×3
SUT V-LOC BARB 180 2/0GR9 GS23 (SUTURE)
SUT VIC AB 0 CT1 27 (SUTURE) ×4
SUT VIC AB 0 CT1 27XBRD ANTBC (SUTURE) ×2 IMPLANT
SUT VICRYL 0 UR6 27IN ABS (SUTURE) ×6 IMPLANT
SUT VLOC BARB 180 ABS3/0GR12 (SUTURE) ×3
SUTURE V-LC BRB 180 2/0GR6GS22 (SUTURE) ×1 IMPLANT
SUTURE V-LC BRB 180 2/0GR9GS23 (SUTURE) IMPLANT
SUTURE VLOC BRB 180 ABS3/0GR12 (SUTURE) ×1 IMPLANT
TOWEL OR 17X26 10 PK STRL BLUE (TOWEL DISPOSABLE) ×6 IMPLANT
TOWEL OR NON WOVEN STRL DISP B (DISPOSABLE) ×3 IMPLANT
TRAY FOLEY W/METER SILVER 14FR (SET/KITS/TRAYS/PACK) ×3 IMPLANT
TRAY FOLEY W/METER SILVER 16FR (SET/KITS/TRAYS/PACK) ×3 IMPLANT
TRAY LAPAROSCOPIC (CUSTOM PROCEDURE TRAY) ×3 IMPLANT
TROCAR XCEL 12X100 BLDLESS (ENDOMECHANICALS) ×3 IMPLANT
WATER STERILE IRR 1500ML POUR (IV SOLUTION) ×3 IMPLANT

## 2015-01-17 NOTE — Transfer of Care (Signed)
Immediate Anesthesia Transfer of Care Note  Patient: Cole [name]  Procedure(s) Performed: Procedure(s): ROBOTIC ASSITED LAPAROSCOPIC LEFT  PARTIAL  NEPHRECTOMY (Left)  Patient Location: PACU  Anesthesia Type:General  Level of Consciousness: awake, sedated and patient cooperative  Airway & Oxygen Therapy: Patient Spontanous Breathing and Patient connected to face mask oxygen  Post-op Assessment: Report given to RN, Post -op Vital signs reviewed and stable and Patient moving all extremities X 4  Post vital signs: Reviewed and stable  Last Vitals:  Filed Vitals:   01/17/15 0532  BP: 134/89  Pulse: 73  Temp: 36.7 C  Resp: 18    Complications: No apparent anesthesia complications

## 2015-01-17 NOTE — Discharge Instructions (Signed)

## 2015-01-17 NOTE — Discharge Summary (Signed)
Date of admission: 01/17/2015  Date of discharge: 01/18/15  Admission diagnosis: Left renal mass  Discharge diagnosis: same  Secondary diagnoses: none  History and Physical: For full details, please see admission history and physical. Briefly, Cole [name] is a 49 y.o. year old patient with left renal mass.   Hospital Course: s/p left robotic partial nephrectomy on 01/17/15. The patient tolerated the procedure well and was transferred to the floor. The patient was on bedrest post op. On POD#1 the patient's diet was advanced to regular, his pain was controlled with PO medications, he ambulated and voided without difficulty. He met all milestones for discharge and his JP drain was removed prior to discharge.   Filed Vitals:   01/17/15 2029 01/18/15 0155 01/18/15 0536 01/18/15 0805  BP: 119/68 113/64 135/71 128/76  Pulse: 63 62 48 53  Temp: 98 F (36.7 C) 98.1 F (36.7 C) 97.7 F (36.5 C) 97.7 F (36.5 C)  TempSrc: Oral Oral Oral Oral  Resp: 16 16 16 18   Height:      Weight:      SpO2: 97% 97% 97% 97%    Intake/Output Summary (Last 24 hours) at 01/18/15 1009 Last data filed at 01/18/15 0600  Gross per 24 hour  Intake 3827.5 ml  Output   2253 ml  Net 1574.5 ml   NAD Abdomen soft Incisions clean/dry/intact Ext symmetric  Laboratory values:   Recent Labs  01/17/15 1158 01/18/15 0448  HGB 12.9* 13.6  HCT 38.7* 40.6    Recent Labs  01/17/15 1158 01/18/15 0448  CREATININE 0.91 0.87    Disposition: Home  Discharge instruction:   1- Drain Sites - You may have some mild persistent drainage from old drain site for several days, this is normal. This can be covered with cotton gauze for convenience.  2 - Stiches - Your stitches are all dissolvable. You may notice a "loose thread" at your incisions, these are normal and require no intervention. You may cut them flush to the skin with fingernail clippers if needed for comfort.  3 - Diet - No restrictions  4 -  Activity - No heavy lifting / straining (any activities that require valsalva or "bearing down") x 4 weeks. Otherwise, no restrictions.  5 - Bathing - You may shower immediately. Do not take a bath or get into swimming pool where incision sites are submersed in water x 4 weeks.   6 - When to Call the Doctor - Call MD for any fever >102, any acute wound problems, or any severe nausea / vomiting. You can call the [facility] [phone]) 24 hours a day 365 days a year. It will roll-over to the answering service and on-call physician after hours.  Discharge medications:    Medication List    STOP taking these medications        aspirin 325 MG tablet     cyclobenzaprine 10 MG tablet  Commonly known as:  FLEXERIL     ibuprofen 200 MG tablet  Commonly known as:  ADVIL,MOTRIN     OSTEO COMPLEX PO     UNABLE TO FIND      TAKE these medications        amphetamine-dextroamphetamine 20 MG tablet  Commonly known as:  ADDERALL  Take 1 tablet (20 mg total) by mouth 2 (two) times daily.     atorvastatin 80 MG tablet  Commonly known as:  LIPITOR  Take 1 tablet (80 mg total) by mouth daily.  cetirizine 10 MG tablet  Commonly known as:  ZYRTEC  Take 10 mg by mouth daily.     HYDROcodone-acetaminophen 5-325 MG per tablet  Commonly known as:  NORCO  Take 1-2 tablets by mouth every 6 (six) hours as needed.     metoprolol 50 MG tablet  Commonly known as:  LOPRESSOR  Take 1 tablet (50 mg total) by mouth 2 (two) times daily.        Followup:  Follow-up Information    Follow up with Ardis Hughs, MD In 2 weeks.   Specialty:  Urology   Why:  We will contact you with time/day of follow-up appointment   Contact information:   [place] [address] [phone]

## 2015-01-17 NOTE — Anesthesia Preprocedure Evaluation (Addendum)
Anesthesia Evaluation  Patient identified by MRN, date of birth, ID band Patient awake    Reviewed: Allergy & Precautions, NPO status , Patient's Chart, lab work & pertinent test results, reviewed documented beta blocker date and time   Airway Mallampati: I  TM Distance: >3 FB Neck ROM: Full    Dental  (+) Upper Dentures, Lower Dentures   Pulmonary Current Smoker,  breath sounds clear to auscultation        Cardiovascular Exercise Tolerance: Good hypertension, Pt. on medications and Pt. on home beta blockers + CAD, + Past MI and + Cardiac Stents Rhythm:Regular Rate:Normal  No active cardiac symptoms.    Neuro/Psych PSYCHIATRIC DISORDERS negative neurological ROS     GI/Hepatic Neg liver ROS, GERD-  ,  Endo/Other  negative endocrine ROS  Renal/GU negative Renal ROS     Musculoskeletal negative musculoskeletal ROS (+)   Abdominal   Peds negative pediatric ROS (+)  Hematology negative hematology ROS (+)   Anesthesia Other Findings   Reproductive/Obstetrics negative OB ROS                           Lab Results  Component Value Date   WBC 9.2 01/09/2015   HGB 14.2 01/09/2015   HCT 42.4 01/09/2015   MCV 92.2 01/09/2015   PLT 284 01/09/2015   Lab Results  Component Value Date   CREATININE 0.85 01/09/2015   BUN 18 01/09/2015   NA 140 01/09/2015   K 5.1 01/09/2015   CL 108 01/09/2015   CO2 26 01/09/2015   No results found for: INR, PROTIME  EKG: sinus bradycardia, 1st degree AV block, possible RBBB.  12/2014: ECHO Left ventricle: The cavity size was normal. Systolic function was normal. The estimated ejection fraction was in the range of 55% to 60%. Wall motion was normal; there were no regional wall motion abnormalities. Left ventricular diastolic function parameters were normal. - Aortic valve: Trileaflet; normal thickness leaflets. There was no regurgitation. - Aortic  root: The aortic root was normal in size. - Mitral valve: Structurally normal valve. There was mild regurgitation. - Left atrium: The atrium was normal in size. - Right ventricle: Systolic function was normal. - Right atrium: The atrium was normal in size. - Tricuspid valve: There was trivial regurgitation. - Pulmonary arteries: Systolic pressure was within the normal range. - Inferior vena cava: The vessel was normal in size. - Pericardium, extracardiac: There was no pericardial effusion.  Anesthesia Physical Anesthesia Plan  ASA: III  Anesthesia Plan: General   Post-op Pain Management:    Induction: Intravenous  Airway Management Planned: Oral ETT  Additional Equipment:   Intra-op Plan:   Post-operative Plan: Extubation in OR  Informed Consent: I have reviewed the patients History and Physical, chart, labs and discussed the procedure including the risks, benefits and alternatives for the proposed anesthesia with the patient or authorized representative who has indicated his/her understanding and acceptance.   Dental advisory given  Plan Discussed with: CRNA  Anesthesia Plan Comments:         Anesthesia Quick Evaluation

## 2015-01-17 NOTE — Op Note (Signed)
Preoperative diagnosis:  1. Left renal mass   Postoperative diagnosis:  1. same   Procedure: 1. Robotic assisted left partial nephrectomy  Surgeon: Ardis Hughs, MD Resident surgeon: Nelwyn Salisbury, MD 1st Assistant: Debbrah Alar, PA  Anesthesia: General  Complications: None   Intraoperative findings: Intraoperative frozen section of tumor base was negative for malignancy.  Warm ischemia time was 19 min.  EBL: 50cc  Specimens: None  Indication: Cole [name] is a 48 y.o. patient with a small left renal mass.  After reviewing the management options for treatment, he elected to proceed with the above surgical procedure(s). We have discussed the potential benefits and risks of the procedure, side effects of the proposed treatment, the likelihood of the patient achieving the goals of the procedure, and any potential problems that might occur during the procedure or recuperation. Informed consent has been obtained.  Description of procedure: The patient was taken back to the operating room placed on the table in supine position. General anesthesia was then induced and endotracheal tube inserted. A Foley catheter was then inserted as well. Patient was then placed in the right lateral decubitus position, was then secured to the table with a beanbag, axillary roll placed on the right axilla and the lower extremities were built up with pillows. All bony prominences were padded and the patient was taped at the chest, hips and knees. The table was then flexed so as to open up the costo-vertebral angle.  A 54m port was placed medial to the 12th rib, lateral to the umbilicus by cutting down through the rectus fascia and incising the peritoneum sharply.   The abdomen was then insufflated to 153mg and the 30 deg camera inserted.  We then placed the three 8 mm robotic ports under visual guidance. The first port is located just below the costal margin lateral to the xiphoid process and a second port  placed in the left lower quadrant so as to triangulate onto the renal hilum. An 12 mm assistant port was placed above the camera port just lateral to midline.. A third robotic port was then placed along the anterior axillary line between iliac crest and the 12th rib.  The robot was then docked, in the left hand a bipolar fenestrated grasper was placed and in the right hand monopolar scissors were placed. The third robotic port was fenestrated grasper which was used to retract the kidney throughout the case.  We then started the case by taking down the adhesions of the omentum off the anterior abdominal wall. Once the adhesions were taken down I was able to dissect the descending colon medially down into the pelvis. The ureter was identified at the lower pole of the left kidney. Further dissection of the ureter from the underlying psoas allowed the ureter to be lifted up using the third robotic arm which allowed lift of the kidney and facilitated dissection of the hilum. We then turned our attention to the renal hilum and dissected out the main renal artery. A vesi-loop was then passed around the vessel and Weck clip placed on the vessel loop.  A second accessory artery then located inferiorly and noted to run directly behind the mass.  This was also dissected out.   Once the hilum had been identified and secured attention was turned to the lower pole of the kidney. Gerotas fat was dissected off the lower pole of the kidney and around the mass. The renal mass was readily identified. The fat surrounding the mass was left  on top of the mass and the remaining fat was dissected off of the surface. Once the fat had been dissected off the surface of the kidney around the lesion, the lesion was marked 360 around with cautery and used to delineate the margin around the mass. 12.5 mg of mannitol was administered. 1 bulldog clamp was then placed across each renal artery and the dissection ensued of the renal mass. This was  done using the scissors sharply without cautery. Once the mass had been completely excised it was placed aside and the defect was closed in 2 layers. The first layer was closed with a 3-0 suture in a running fashion within the base of the resection, closing the collecting system and vasculature. The ends of the V-loc were secured to either end of the defect with a Weck clip. The second layer was closed with a 2-0 V-loc suture in an interrupted fashion reapproximating the edges and creating tension by cinching the sutures down with Weck clips. Total clamp time was 19 minutes. Once the second layer of suture had been completed the bulldogs released the renal artery there was no secondary or ongoing bleeding. An additional 12.49m of mannitol was given once the clamps were removed. FloSeal was then squirted into the region of the resected mass and the remaining gerota's fat was then stretched over the defect and secured with Weck clips. The mass was then placed in an Endo Catch bag. The string from the Endo Catch bag was then pulled through the assistant port. The Vesseloops were then removed from the patient. A drain was then passed through the third port and positioned around the resected area.  The camera port was then closed using a 0-vicryl on fascia. The ports were then all removed. The specimen was removed through the camera port without a significant need for dilating the fascia. Once the specimen was removed the fascia was then approximated with 0 Vicryl in 2 interrupted stitches. All skin incisions were then closed with a 4-0 Monocryl in a subcutaneous fashion. The drain was secured with a 3-0 nylon. Dermabond was then applied to the incisions. The patient was subsequently extubated and returned to the PACU in excellent condition. All laps, needles and sponges were accounted for at the end of case.  BArdis Hughs M.D.

## 2015-01-17 NOTE — Anesthesia Postprocedure Evaluation (Signed)
  Anesthesia Post-op Note  Patient: Cole [name]  Procedure(s) Performed: Procedure(s): ROBOTIC ASSITED LAPAROSCOPIC LEFT  PARTIAL  NEPHRECTOMY (Left)  Patient Location: PACU  Anesthesia Type:General  Level of Consciousness: awake  Airway and Oxygen Therapy: Patient Spontanous Breathing  Post-op Pain: mild  Post-op Assessment: Post-op Vital signs reviewed              Post-op Vital Signs: Reviewed  Last Vitals:  Filed Vitals:   01/17/15 1115  BP: 122/82  Pulse: 59  Temp:   Resp: 15    Complications: No apparent anesthesia complications

## 2015-01-17 NOTE — H&P (Signed)
Reason For Visit   Left renal mass   History of Present Illness   This 49 year old male who I'm seeing as referral from Dr. Rana Snare, M.D. for evaluation and management of enhancing left small renal mass.     This was picked up December 2015 during a evaluation for Crohn's disease. Initially it was a poorly characterized 1.5 cm mass on the central portion of the kidney in the lower pole on the anterior surface. Follow-up MRI was suggested. 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    The patient has no symptoms of flank pain or hematuria. He has no family history of GU malignancy. He himself has no history of cancer. He has had a perforated appendix requiring exploratory laparotomy and resection of his terminal ileum several inches of his colon. The patient denies any history of complications from anesthesia. He also had a heart attack and subsequently stent placed in 2010. Currently, the patient is not taking any anticoagulation and has not followed up with cardiologist. He has had no complications following this.     Past Medical History Problems  1. History of hyperlipidemia (Z86.39) 2. History of hypertension (Z86.79) 3. History of myocardial infarction (I25.2)  Surgical History Problems  1. History of Appendectomy 2. History of Cath Stent Placement 3. History of Intestinal Surgery  Current Meds 1. Adderall 20 MG Oral Tablet (Amphetamine-Dextroamphetamine);  Therapy: (Recorded:24Dec2015) to Recorded 2. Aspirin 81 MG Oral Tablet Chewable;  Therapy: (Recorded:24Dec2015) to Recorded 3. Atorvastatin Calcium 80 MG Oral Tablet;  Therapy: (Recorded:24Dec2015) to Recorded 4. Dexamethasone 1 MG Oral Tablet; Pt to take tab  between 11p-12 midnight the night  before blood draw;  Therapy: [id number] to (Last Rx:25Jan2016)  Requested for: 25Jan2016 Ordered 5. Metoprolol Tartrate 50 MG Oral Tablet;  Therapy: (Recorded:24Dec2015) to Recorded 6. Zyrtec TABS;  Therapy: (Recorded:24Dec2015) to Recorded  Allergies Medication  1. No Known Drug Allergies  Family History Problems  1. Family history of arthritis (Z82.61) : Mother 2. Family history of hypercholesterolemia (Z83.49) : Mother 3. Family history of hypertension (Z82.49) : Mother 4. No significant family history : Father  Social History Problems  1. Alcohol use (Z78.9)   1 weekly 2. Caffeine use (F15.90)   6 qd 3. Father's age   32yr 4. Former smoker [phone] 5. Married 6. Mother's age   656yr7. 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     Vitals Vital Signs [Data Includes: Last 1 Day]  Recorded: 27Jun2016 04:12PM  Blood Pressure: 116 / 74 Temperature: 98 F Heart Rate: 56  Physical Exam Constitutional: Well nourished and well developed . No acute distress.   ENT:. The ears and nose are normal in appearance.   Neck: The appearance of the neck is normal and no neck mass is present.   Pulmonary: No respiratory distress and normal respiratory rhythm and effort . End expiratory wheeze noted in the left posterior lung.   Cardiovascular: Heart rate and rhythm are normal . The arterial pulses are normal. No peripheral edema.   Abdomen: The abdomen is soft and nontender. No masses are palpated. No CVA tenderness. No hernias are palpable. No hepatosplenomegaly noted. Periumbilical midline  incision is well-healed. There are no additional scars. The abdomen is soft.   Lymphatics: The femoral and inguinal nodes are not enlarged or tender.   Skin: Normal skin turgor, no visible rash and no visible skin lesions.   Neuro/Psych:. Mood and affect are  appropriate.    Results/Data Urine [Data Includes: Last 1 Day]   27Jun2016  COLOR YELLOW   APPEARANCE CLEAR   SPECIFIC GRAVITY 1.020   pH 5.5   GLUCOSE NEG mg/dL  BILIRUBIN NEG   KETONE TRACE mg/dL  BLOOD NEG   PROTEIN NEG mg/dL  UROBILINOGEN 0.2 mg/dL  NITRITE NEG   LEUKOCYTE ESTERASE NEG      I independently reviewed the patient's MRI.   Assessment Assessed  1. Renal mass (N28.89)  Plan Health Maintenance  1. UA With REFLEX; [Do Not Release]; Status:Complete;   Done: [id number] 03:50PM Renal mass  2. Follow-up Schedule Surgery Office  Follow-up  Status: Complete  Done: 27Jun2016  Discussion/Summary   The patient has an enhancing small left renal mass on the anterior surface of the kidney just below the hilum. I discussed the significance of an enhancing renal mass with the patient. We discussed the treatment options including active surveillance and surgical extirpation. Given the anterior location, this is not amenable to needle ablation. Ultimately, the patient has opted for partial nephrectomy.  The patient has been given the natural history of renal cancer, treatment options, and recommended surgical extirpation for this patient. I went over the robotic-assisted laparoscopic partial nephrectomy approach. I described for the patient the procedure in detail including port placement. I detailed the postoperative course including the fact that the patient would have both a drain and a Foley catheter following the surgery. I told the patient that most often patients are discharged on postoperative day one or 2. I then detailed the expected recovery time, I told the patient that he would not be able to lift anything greater than 20 pounds for 4 weeks. I also went over the risks and benefits of this operation in great detail. We discussed the risk of injury to surrounding structures, major blood vessels and nerves, bleeding, infection, loss of kidney, and the risk of recurrent  cancer.      I did explain to the patient that he may be at slightly higher risk for nephrectomy or bleeding complications given the proximity of the lesion to the renal hilum.

## 2015-01-17 NOTE — Anesthesia Procedure Notes (Signed)
Procedure Name: Intubation Date/Time: 01/17/2015 7:34 AM Performed by: Ofilia Neas Pre-anesthesia Checklist: Patient identified, Emergency Drugs available, Suction available, Patient being monitored and Timeout performed Patient Re-evaluated:Patient Re-evaluated prior to inductionOxygen Delivery Method: Circle system utilized Preoxygenation: Pre-oxygenation with 100% oxygen Intubation Type: IV induction Ventilation: Mask ventilation without difficulty Laryngoscope Size: Mac and 4 Grade View: Grade I Tube type: Oral Tube size: 7.5 mm Number of attempts: 1 Airway Equipment and Method: Stylet Placement Confirmation: ETT inserted through vocal cords under direct vision,  positive ETCO2 and breath sounds checked- equal and bilateral Secured at: 21 cm Tube secured with: Tape Dental Injury: Teeth and Oropharynx as per pre-operative assessment

## 2015-01-18 ENCOUNTER — Encounter (HOSPITAL_COMMUNITY): Payer: Self-pay | Admitting: Urology

## 2015-01-18 LAB — CBC
HEMATOCRIT: 40.6 % (ref 39.0–52.0)
Hemoglobin: 13.6 g/dL (ref 13.0–17.0)
MCH: 30.4 pg (ref 26.0–34.0)
MCHC: 33.5 g/dL (ref 30.0–36.0)
MCV: 90.6 fL (ref 78.0–100.0)
PLATELETS: 247 10*3/uL (ref 150–400)
RBC: 4.48 MIL/uL (ref 4.22–5.81)
RDW: 13.3 % (ref 11.5–15.5)
WBC: 10 10*3/uL (ref 4.0–10.5)

## 2015-01-18 LAB — BASIC METABOLIC PANEL
Anion gap: 5 (ref 5–15)
BUN: 9 mg/dL (ref 6–20)
CALCIUM: 8.4 mg/dL — AB (ref 8.9–10.3)
CO2: 25 mmol/L (ref 22–32)
CREATININE: 0.87 mg/dL (ref 0.61–1.24)
Chloride: 107 mmol/L (ref 101–111)
GLUCOSE: 157 mg/dL — AB (ref 65–99)
Potassium: 4 mmol/L (ref 3.5–5.1)
Sodium: 137 mmol/L (ref 135–145)

## 2015-01-18 NOTE — Progress Notes (Signed)
JP discontinued.  Went over AVS summary with pt and wife.  Prescriptions given.  All questions answered.   Refused wheelchair.  Walked out with wife.

## 2015-01-20 ENCOUNTER — Emergency Department (HOSPITAL_COMMUNITY)
Admission: EM | Admit: 2015-01-20 | Discharge: 2015-01-20 | Disposition: A | Discharge status: Home / Self Care | Payer: Federal, State, Local not specified - PPO | Attending: Emergency Medicine | Admitting: Emergency Medicine

## 2015-01-20 ENCOUNTER — Encounter (HOSPITAL_COMMUNITY): Payer: Self-pay | Admitting: Emergency Medicine

## 2015-01-20 DIAGNOSIS — T814XXA Infection following a procedure, initial encounter: Secondary | ICD-10-CM | POA: Insufficient documentation

## 2015-01-20 DIAGNOSIS — I252 Old myocardial infarction: Secondary | ICD-10-CM | POA: Insufficient documentation

## 2015-01-20 DIAGNOSIS — Z9861 Coronary angioplasty status: Secondary | ICD-10-CM | POA: Insufficient documentation

## 2015-01-20 DIAGNOSIS — K219 Gastro-esophageal reflux disease without esophagitis: Secondary | ICD-10-CM | POA: Insufficient documentation

## 2015-01-20 DIAGNOSIS — R109 Unspecified abdominal pain: Secondary | ICD-10-CM | POA: Diagnosis not present

## 2015-01-20 DIAGNOSIS — I10 Essential (primary) hypertension: Secondary | ICD-10-CM | POA: Insufficient documentation

## 2015-01-20 DIAGNOSIS — IMO0001 Reserved for inherently not codable concepts without codable children: Secondary | ICD-10-CM

## 2015-01-20 DIAGNOSIS — Z79899 Other long term (current) drug therapy: Secondary | ICD-10-CM | POA: Diagnosis not present

## 2015-01-20 DIAGNOSIS — Z9049 Acquired absence of other specified parts of digestive tract: Secondary | ICD-10-CM | POA: Insufficient documentation

## 2015-01-20 DIAGNOSIS — Y838 Other surgical procedures as the cause of abnormal reaction of the patient, or of later complication, without mention of misadventure at the time of the procedure: Secondary | ICD-10-CM | POA: Insufficient documentation

## 2015-01-20 LAB — CBC
HEMATOCRIT: 39.7 % (ref 39.0–52.0)
Hemoglobin: 13.8 g/dL (ref 13.0–17.0)
MCH: 31.9 pg (ref 26.0–34.0)
MCHC: 34.8 g/dL (ref 30.0–36.0)
MCV: 91.9 fL (ref 78.0–100.0)
Platelets: 226 10*3/uL (ref 150–400)
RBC: 4.32 MIL/uL (ref 4.22–5.81)
RDW: 13.6 % (ref 11.5–15.5)
WBC: 10.4 10*3/uL (ref 4.0–10.5)

## 2015-01-20 LAB — BASIC METABOLIC PANEL
Anion gap: 7 (ref 5–15)
BUN: 12 mg/dL (ref 6–20)
CALCIUM: 8.6 mg/dL — AB (ref 8.9–10.3)
CHLORIDE: 103 mmol/L (ref 101–111)
CO2: 27 mmol/L (ref 22–32)
CREATININE: 0.95 mg/dL (ref 0.61–1.24)
GFR calc Af Amer: >60 mL/min (ref >60)
GFR calc non Af Amer: >60 mL/min (ref >60)
GLUCOSE: 111 mg/dL — AB (ref 65–99)
Potassium: 3.7 mmol/L (ref 3.5–5.1)
Sodium: 137 mmol/L (ref 135–145)

## 2015-01-20 MED ORDER — CEPHALEXIN 500 MG PO CAPS: 500.0000 mg | ORAL_CAPSULE | Freq: Four times a day (QID) | ORAL | Status: DC

## 2015-01-20 MED ORDER — CEPHALEXIN 500 MG PO CAPS
500.0000 mg | ORAL_CAPSULE | Freq: Once | ORAL | Status: AC
  Administered 2015-01-20: 500 mg via ORAL
  Filled 2015-01-20: qty 1

## 2015-01-20 NOTE — ED Notes (Signed)
Pt is a&ox4, denied questions r/t dc. Pt ambulatory

## 2015-01-20 NOTE — Discharge Instructions (Signed)
Cellulitis Cellulitis is an infection of the skin and the tissue beneath it. The infected area is usually red and tender. Cellulitis occurs most often in the arms and lower legs.  CAUSES  Cellulitis is caused by bacteria that enter the skin through cracks or cuts in the skin. The most common types of bacteria that cause cellulitis are staphylococci and streptococci. SIGNS AND SYMPTOMS   Redness and warmth.  Swelling.  Tenderness or pain.  Fever. DIAGNOSIS  Your health care provider can usually determine what is wrong based on a physical exam. Blood tests may also be done. TREATMENT  Treatment usually involves taking an antibiotic medicine. HOME CARE INSTRUCTIONS   Take your antibiotic medicine as directed by your health care provider. Finish the antibiotic even if you start to feel better.  Keep the infected arm or leg elevated to reduce swelling.  Apply a warm cloth to the affected area up to 4 times per day to relieve pain.  Take medicines only as directed by your health care provider.  Keep all follow-up visits as directed by your health care provider. SEEK MEDICAL CARE IF:   You notice red streaks coming from the infected area.  Your red area gets larger or turns dark in color.  Your bone or joint underneath the infected area becomes painful after the skin has healed.  Your infection returns in the same area or another area.  You notice a swollen bump in the infected area.  You develop new symptoms.  You have a fever. SEEK IMMEDIATE MEDICAL CARE IF:   You feel very sleepy.  You develop vomiting or diarrhea.  You have a general ill feeling (malaise) with muscle aches and pains. MAKE SURE YOU:   Understand these instructions.  Will watch your condition.  Will get help right away if you are not doing well or get worse. Document Released: 02/12/2005 Document Revised: 09/19/2013 Document Reviewed: 07/21/2011 [facility] Patient Information 2015 [place], [place].  This information is not intended to replace advice given to you by your health care provider. Make sure you discuss any questions you have with your health care provider.  Please use antibiotics as directed, please follow-up with your surgeon first thing next week for further evaluation and management. If infection continues to persist or worsen please return to the emergency room for further evaluation and management.

## 2015-01-20 NOTE — ED Notes (Signed)
Bed: WA01 Expected date:  Expected time:  Means of arrival:  Comments: [facility]

## 2015-01-20 NOTE — ED Notes (Signed)
Per pt, states had surgery on the [address]-woke up this am with a couple of the lap sites are red and look infected, no fevers

## 2015-01-20 NOTE — ED Provider Notes (Signed)
CSN: [id number]     Arrival date & time 01/20/15  86 History   First MD Initiated Contact with Patient 01/20/15 2014     Chief Complaint  Patient presents with  . sugical site infection    HPI   49 year old male presents today with surgical site infection. Patient reports that he had a partial nephrectomy on 01/17/2015 by [facility]. He notes that this morning he noticed redness around one of the surgical sites, minor tenderness to palpation. Patient reports she's been feeling otherwise well, denies fever, chills, nausea, vomiting, dizziness, significant abdominal pain other than that noted from the surgery. He's been passing stools, urine normal color clarity in characteristics. Patient has no other concerns other than the increasing redness to the abdomen. Not currently taking any antibiotics, reports that he has not used narcotic pain medication for the pain in several days.     Past Medical History  Diagnosis Date  . Hyperlipidemia   . Hypertension   . ADD (attention deficit disorder)   . ASHD (arteriosclerotic heart disease)   . Morbid obesity   . Positive for macroalbuminuria   . Crohn's disease   . Myocardial infarction     status post RCA stenting in [facility] 2011  . Appendicitis   . Colitis   . GERD (gastroesophageal reflux disease)   . Left renal mass   . Eczema    Past Surgical History  Procedure Laterality Date  . Appendectomy    . Coronary stent placement      x 1  . Colon surgery    . Robotic assited partial nephrectomy Left 01/17/2015    Procedure: ROBOTIC ASSITED LAPAROSCOPIC LEFT  PARTIAL  NEPHRECTOMY;  Surgeon: Ardis Hughs, MD;  Location: [facility];  Service: Urology;  Laterality: Left;   Family History  Problem Relation Age of Onset  . Diabetes Maternal Grandmother    Social History  Substance Use Topics  . Smoking status: Current Every Day Smoker -- 1.00 packs/day for 30 years  . Smokeless tobacco: Never Used  . Alcohol Use: No     Review of Systems  All other systems reviewed and are negative.   Allergies  Review of patient's allergies indicates no known allergies.  Home Medications   Prior to Admission medications   Medication Sig Start Date End Date Taking? Authorizing Provider  amphetamine-dextroamphetamine (ADDERALL) 20 MG tablet Take 1 tablet (20 mg total) by mouth 2 (two) times daily. 11/06/14 11/06/15 Yes Vicie Mutters, PA-C  atorvastatin (LIPITOR) 80 MG tablet Take 1 tablet (80 mg total) by mouth daily. Patient taking differently: Take 80 mg by mouth every evening.  12/25/14  Yes Vicie Mutters, PA-C  cetirizine (ZYRTEC) 10 MG tablet Take 10 mg by mouth daily as needed for allergies.    Yes Historical Provider, MD  HYDROcodone-acetaminophen (NORCO) 5-325 MG per tablet Take 1-2 tablets by mouth every 6 (six) hours as needed. Patient taking differently: Take 1-2 tablets by mouth every 6 (six) hours as needed for moderate pain.  01/17/15  Yes Amanda Dancy, PA-C  metoprolol (LOPRESSOR) 50 MG tablet Take 1 tablet (50 mg total) by mouth 2 (two) times daily. 12/25/14  Yes Vicie Mutters, PA-C  cephALEXin (KEFLEX) 500 MG capsule Take 1 capsule (500 mg total) by mouth 4 (four) times daily. 01/20/15   Maanasa Aderhold, PA-C   BP 138/78 mmHg  Pulse 68  Temp(Src) 98.6 F (37 C) (Oral)  Resp 18  SpO2 97%   Physical Exam  Constitutional: He  is oriented to person, place, and time. He appears well-developed and well-nourished.  HENT:  Head: Normocephalic and atraumatic.  Eyes: Conjunctivae are normal. Pupils are equal, round, and reactive to light. Right eye exhibits no discharge. Left eye exhibits no discharge. No scleral icterus.  Neck: Normal range of motion. No JVD present. No tracheal deviation present.  Cardiovascular: Normal rate, regular rhythm and intact distal pulses.  Exam reveals friction rub. Exam reveals no gallop.   No murmur heard. Pulmonary/Chest: Effort normal. No stridor.  Abdominal: Soft. Bowel  sounds are normal. He exhibits no distension and no mass. There is tenderness. There is no rebound.  Minor tenderness to palpation of the abdomen about the surgical sites.  Musculoskeletal: Normal range of motion. He exhibits no edema or tenderness.  Neurological: He is alert and oriented to person, place, and time. Coordination normal.  Skin: Skin is warm and dry.  Psychiatric: He has a normal mood and affect. His behavior is normal. Judgment and thought content normal.  Nursing note and vitals reviewed.          ED Course  Procedures (including critical care time) Labs Review Labs Reviewed  BASIC METABOLIC PANEL - Abnormal; Notable for the following:    Glucose, Bld 111 (*)    Calcium 8.6 (*)    All other components within normal limits  CBC    Imaging Review No results found. I have personally reviewed and evaluated these images and lab results as part of my medical decision-making.   EKG Interpretation None      MDM   Final diagnoses:  Superficial incisional surgical site infection, initial encounter    Labs: CBC, BMP no significant findings  Imaging:  Consults: Urology  Therapeutics: Keflex  Discharge Meds: Keflex  Assessment/Plan: Patient presents with cellulitis surrounding surgical incision site. He is afebrile, reassuring labs, reassuring vital signs. No fever, nausea, vomiting, significant abdominal pain. Urology was consult at who instructed that patient apply warm compresses, Keflex, follow-up in office. Patient was given these instructions, encouraged to return immediately to the emergency room if redness begins to spread, increasing abdominal pain, fever, or any other concerning signs or symptoms. Patient verbalized understanding and agreement today's plan and had no further questions concerns at time of discharge.        Okey Regal, PA-C 01/21/15 0120  Evelina Bucy, MD 01/21/15 [phone]

## 2015-03-27 IMAGING — CT CT ABD-PELV W/ CM
2 of 5 series · 15 of 46 positions shown, 17 images · IV contrast (Omnipaque 300)
Comparison: Small bowel series 02/24/2014 and CT abdomen pelvis
12/31/2009.

CLINICAL DATA: Chronic diarrhea with occasional mid abdominal pain,
initial on counter.

EXAM:
CT ABDOMEN AND PELVIS WITH CONTRAST
TECHNIQUE: Multidetector CT imaging of the abdomen and pelvis was performed
using the standard protocol following bolus administration of
intravenous contrast.
CONTRAST:  100mL OMNIPAQUE IOHEXOL 300 MG/ML  SOLN

[Series 2: abd/ pel 5mm · axial · 0.97mm/px · z∈[-608,-93]mm · 12 of 117 slices shown, 14 images]
[im 7/117  soft-tissue]
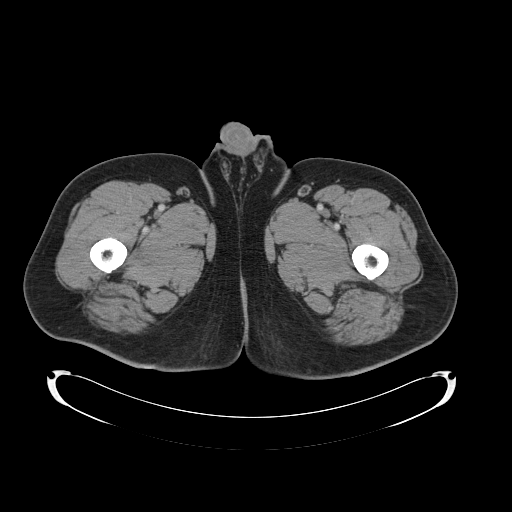
[im 7/117  bone]
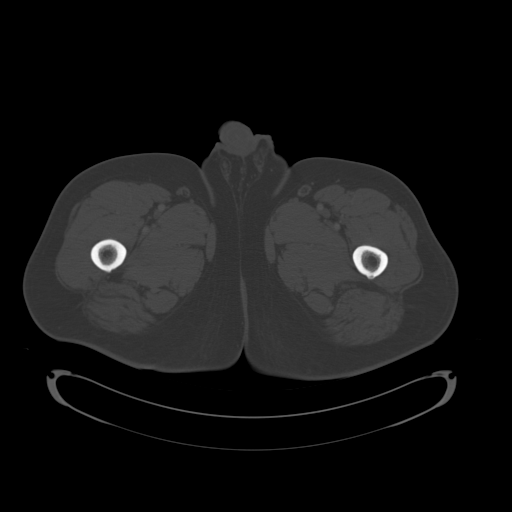
[im 20/117  soft-tissue]
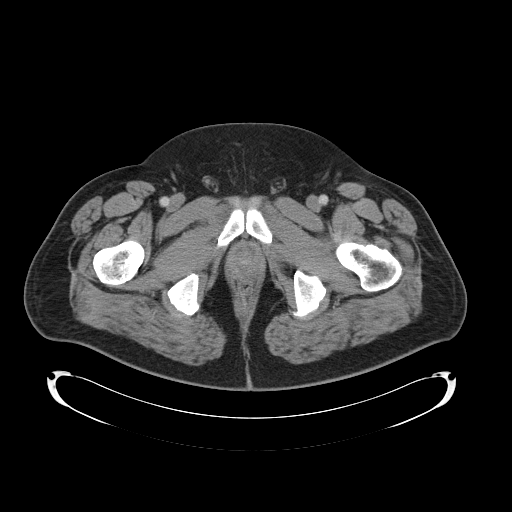
[im 26/117  soft-tissue]
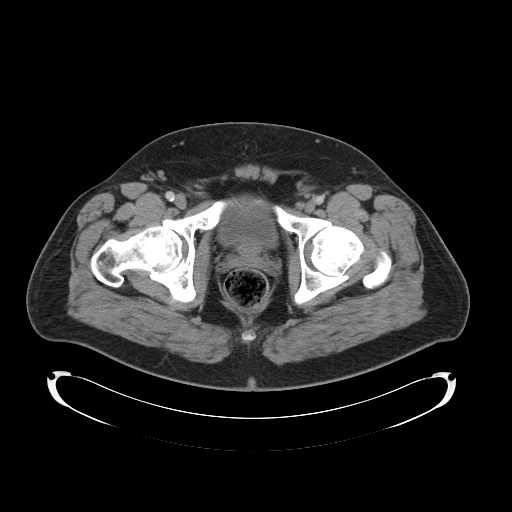
[im 33/117  soft-tissue]
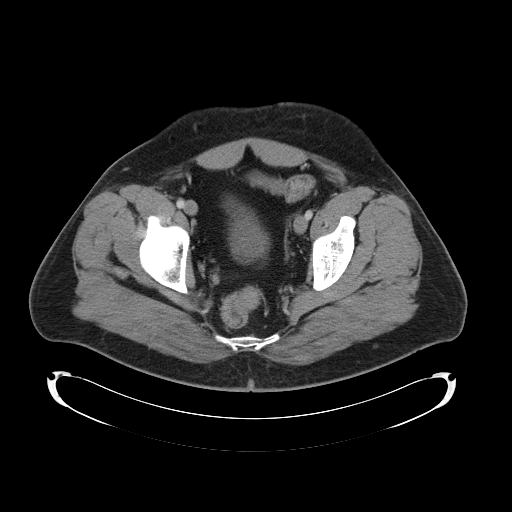
[im 46/117  soft-tissue]
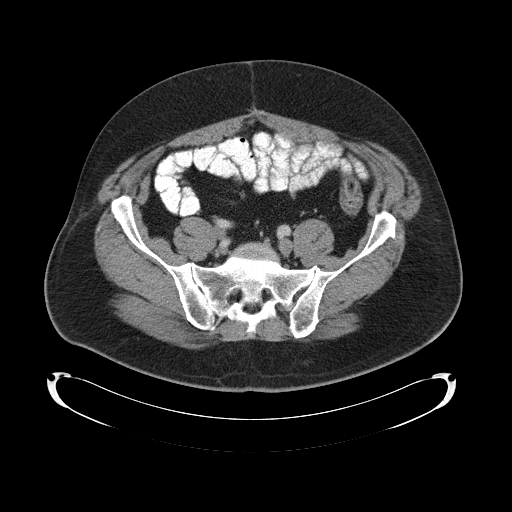
[im 52/117  soft-tissue]
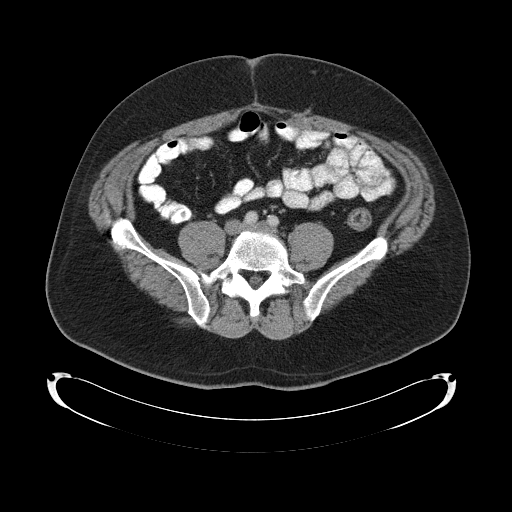
[im 65/117  soft-tissue]
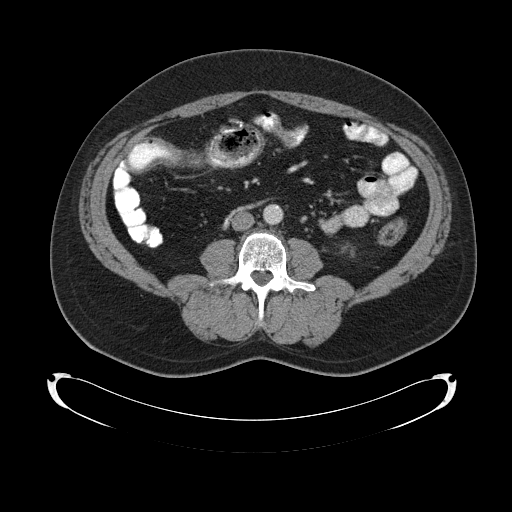
[im 71/117  soft-tissue]
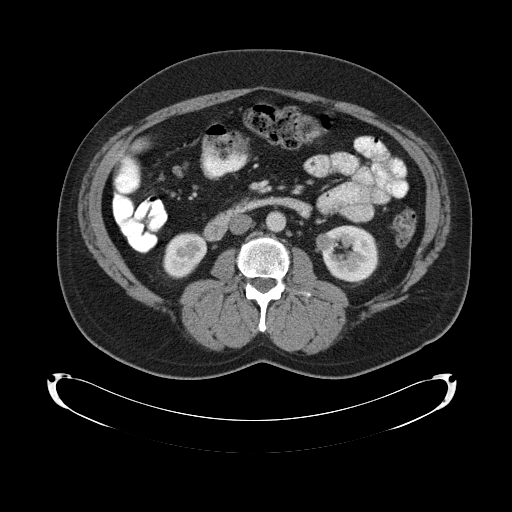
[im 84/117  soft-tissue]
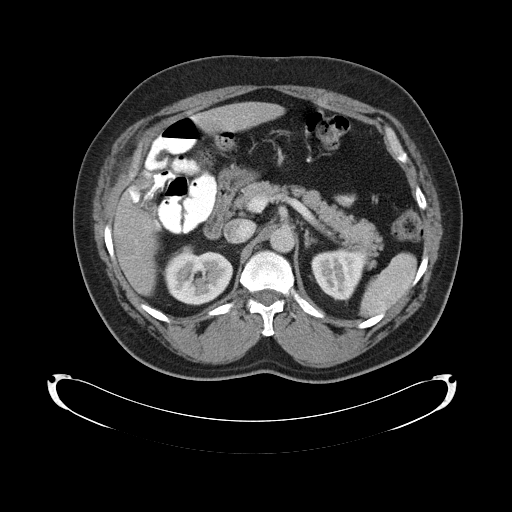
[im 84/117  bone]
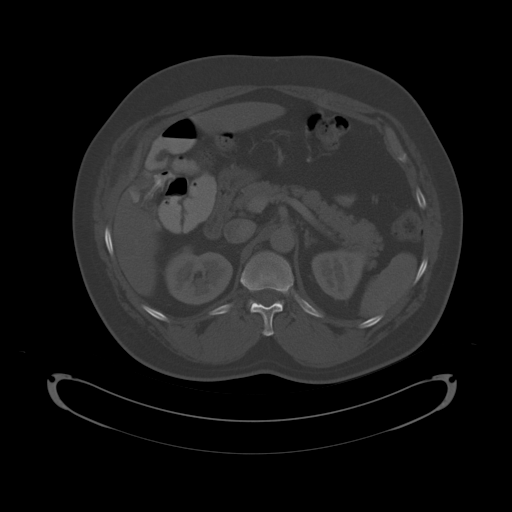
[im 91/117  soft-tissue]
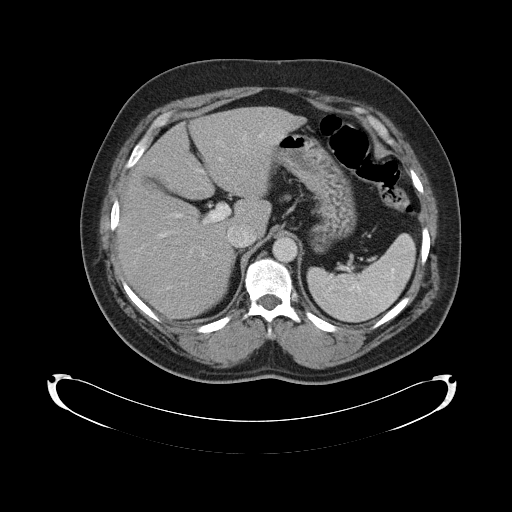
[im 97/117  soft-tissue]
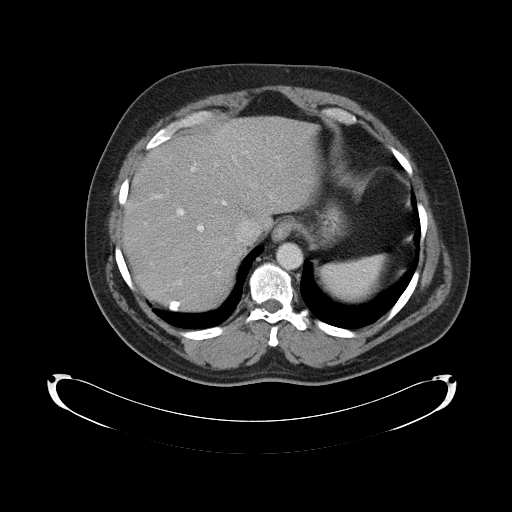
[im 110/117  soft-tissue]
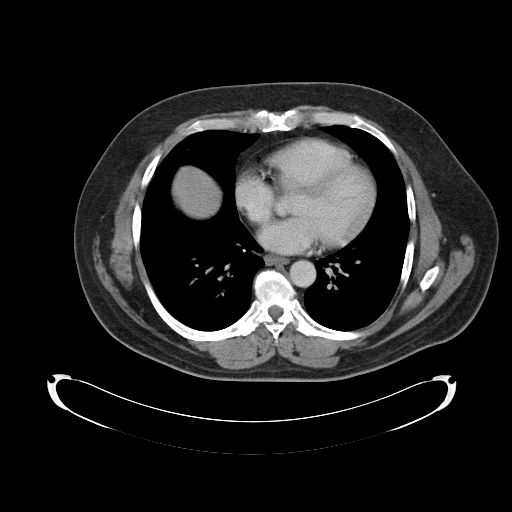

[Series 602: <mpr range> · coronal · 1.17mm/px · 3 of 152 slices shown]
[im 51/152  soft-tissue]
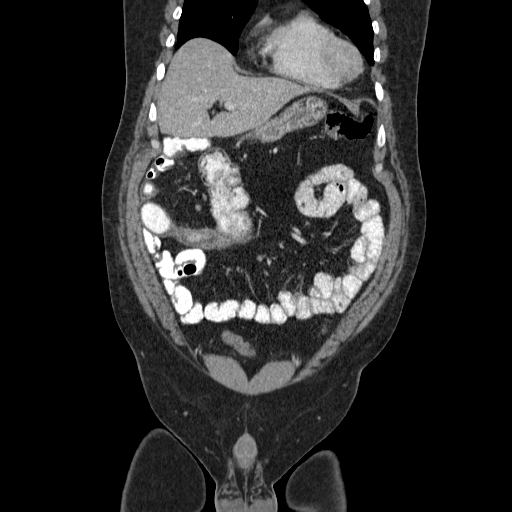
[im 68/152  soft-tissue]
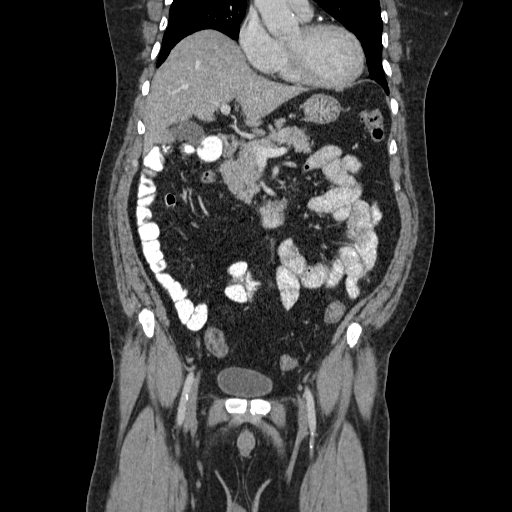
[im 84/152  soft-tissue]
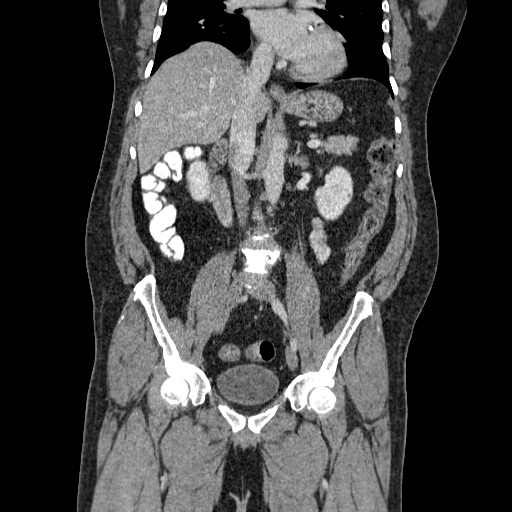

[15 of 46 positions shown; findings below may reference images not displayed]

FINDINGS: Lower chest: Lung bases show scarring in the right middle lobe. 4 mm
nodule in the right lower lobe is stable and therefore benign. Heart
is at the upper limits of normal in size. Three-vessel coronary
artery calcification. No pericardial effusion. No pleural effusion.

Hepatobiliary: 5 mm low-attenuation lesion in the dome of the liver
is too small to characterize but likely a cyst. Calcification is
seen in the right hepatic lobe. Liver and gallbladder are otherwise
unremarkable. No biliary ductal dilatation.

Pancreas: Negative.

Spleen: Negative.

Adrenals/Urinary Tract: Right adrenal gland is unremarkable. A 2.4 x
3.0 cm low-attenuation lesion in the left adrenal gland is unchanged
from 12/31/2009. 9 mm low-attenuation lesion in the upper pole right
kidney is too small to definitively characterize. Small stone in the
lower pole right kidney. A 1.6 cm hyperdense lesion in the lower
pole left kidney measures 54 Hounsfield units on portal venous phase
imaging and 52 Hounsfield units on nephrographic phase imaging.
Comparison with 12/31/2009 is difficult, given lack of IV contrast.
A similar-sized lesion is suspected on that exam, but does not
appear hyperdense, however. Ureters are decompressed. Bladder is
unremarkable.

Stomach/Bowel: Stomach and majority of the small bowel are
unremarkable. There is wall thickening and apparent narrowing
involving the distal/ terminal ileum (series 2, image 55 and series
5, image 37), with an ileocolic anastomosis. Fair amount of stool is
seen in the colon. Colon is otherwise unremarkable.

Vascular/Lymphatic: Atherosclerotic calcification of the arterial
vasculature without abdominal aortic aneurysm. No pathologically
enlarged lymph nodes. Clustered ileocolic mesenteric lymph nodes
measure up to 9 mm.

Reproductive: Prostate is normal in size.

Other: No free fluid.  Mesenteries and peritoneum are unremarkable.

Musculoskeletal: No worrisome lytic or sclerotic lesions.
Degenerative changes are seen in the spine.
IMPRESSION: 1. Postoperative changes of an ileocolic anastomosis with wall
thickening and apparent narrowing involving the distal/terminal
ileum. Findings can be seen with a fibrostenotic stricture related
to Crohn disease. No significant associated inflammatory changes to
indicate active disease.
2. Three-vessel coronary artery calcification.
3. Left adrenal adenoma.
4. Hyperdense lesion in the left kidney may represent a hyperdense
cyst but is difficult to definitively characterize without
precontrast imaging. Therefore, renal cell carcinoma cannot be
definitively excluded. Please see discussion above. Further
evaluation with pre and post contrast MRI or CT should be
considered. MRI is preferred in younger patients (due to lack of
ionizing radiation) and for evaluating calcified lesion(s).

## 2015-04-05 ENCOUNTER — Other Ambulatory Visit: Payer: Self-pay

## 2015-04-05 MED ORDER — METOPROLOL TARTRATE 50 MG PO TABS: 50.0000 mg | ORAL_TABLET | Freq: Two times a day (BID) | ORAL | Status: DC

## 2015-05-22 ENCOUNTER — Other Ambulatory Visit: Payer: Self-pay

## 2015-05-22 MED ORDER — ATORVASTATIN CALCIUM 80 MG PO TABS: 80.0000 mg | ORAL_TABLET | Freq: Every day | ORAL | Status: DC

## 2015-06-11 ENCOUNTER — Encounter: Payer: Self-pay | Admitting: Physician Assistant

## 2015-06-11 ENCOUNTER — Ambulatory Visit (INDEPENDENT_AMBULATORY_CARE_PROVIDER_SITE_OTHER): Payer: Federal, State, Local not specified - PPO | Admitting: Physician Assistant

## 2015-06-11 VITALS — BP 126/64 | HR 71 | Temp 97.9°F | Resp 16 | Ht 69.0 in | Wt 230.0 lb

## 2015-06-11 DIAGNOSIS — E559 Vitamin D deficiency, unspecified: Secondary | ICD-10-CM | POA: Diagnosis not present

## 2015-06-11 DIAGNOSIS — E669 Obesity, unspecified: Secondary | ICD-10-CM | POA: Diagnosis not present

## 2015-06-11 DIAGNOSIS — I2510 Atherosclerotic heart disease of native coronary artery without angina pectoris: Secondary | ICD-10-CM | POA: Diagnosis not present

## 2015-06-11 DIAGNOSIS — Z79899 Other long term (current) drug therapy: Secondary | ICD-10-CM

## 2015-06-11 DIAGNOSIS — F988 Other specified behavioral and emotional disorders with onset usually occurring in childhood and adolescence: Secondary | ICD-10-CM

## 2015-06-11 DIAGNOSIS — E785 Hyperlipidemia, unspecified: Secondary | ICD-10-CM | POA: Diagnosis not present

## 2015-06-11 DIAGNOSIS — F909 Attention-deficit hyperactivity disorder, unspecified type: Secondary | ICD-10-CM

## 2015-06-11 DIAGNOSIS — J069 Acute upper respiratory infection, unspecified: Secondary | ICD-10-CM

## 2015-06-11 DIAGNOSIS — Z720 Tobacco use: Secondary | ICD-10-CM

## 2015-06-11 DIAGNOSIS — I10 Essential (primary) hypertension: Secondary | ICD-10-CM | POA: Diagnosis not present

## 2015-06-11 DIAGNOSIS — F17200 Nicotine dependence, unspecified, uncomplicated: Secondary | ICD-10-CM

## 2015-06-11 LAB — HEPATIC FUNCTION PANEL
ALT: 21 U/L (ref 9–46)
AST: 16 U/L (ref 10–35)
Albumin: 3.7 g/dL (ref 3.6–5.1)
Alkaline Phosphatase: 103 U/L (ref 40–115)
BILIRUBIN DIRECT: 0.1 mg/dL (ref <=0.2)
BILIRUBIN INDIRECT: 0.2 mg/dL (ref 0.2–1.2)
TOTAL PROTEIN: 6.6 g/dL (ref 6.1–8.1)
Total Bilirubin: 0.3 mg/dL (ref 0.2–1.2)

## 2015-06-11 LAB — LIPID PANEL
CHOL/HDL RATIO: 4.3 ratio (ref <=5.0)
CHOLESTEROL: 111 mg/dL — AB (ref 125–200)
HDL: 26 mg/dL — ABNORMAL LOW (ref >=40)
LDL Cholesterol: 47 mg/dL (ref <130)
TRIGLYCERIDES: 191 mg/dL — AB (ref <150)
VLDL: 38 mg/dL — AB (ref <30)

## 2015-06-11 LAB — CBC WITH DIFFERENTIAL/PLATELET
BASOS PCT: 1 % (ref 0–1)
Basophils Absolute: 0.1 10*3/uL (ref 0.0–0.1)
Eosinophils Absolute: 0.2 10*3/uL (ref 0.0–0.7)
Eosinophils Relative: 3 % (ref 0–5)
HEMATOCRIT: 41.4 % (ref 39.0–52.0)
HEMOGLOBIN: 14.4 g/dL (ref 13.0–17.0)
LYMPHS PCT: 15 % (ref 12–46)
Lymphs Abs: 1.2 10*3/uL (ref 0.7–4.0)
MCH: 31.6 pg (ref 26.0–34.0)
MCHC: 34.8 g/dL (ref 30.0–36.0)
MCV: 91 fL (ref 78.0–100.0)
MPV: 10.3 fL (ref 8.6–12.4)
Monocytes Absolute: 0.7 10*3/uL (ref 0.1–1.0)
Monocytes Relative: 9 % (ref 3–12)
NEUTROS ABS: 5.9 10*3/uL (ref 1.7–7.7)
NEUTROS PCT: 72 % (ref 43–77)
Platelets: 258 10*3/uL (ref 150–400)
RBC: 4.55 MIL/uL (ref 4.22–5.81)
RDW: 14.6 % (ref 11.5–15.5)
WBC: 8.2 10*3/uL (ref 4.0–10.5)

## 2015-06-11 LAB — MAGNESIUM: Magnesium: 1.8 mg/dL (ref 1.5–2.5)

## 2015-06-11 LAB — BASIC METABOLIC PANEL WITH GFR
BUN: 14 mg/dL (ref 7–25)
CALCIUM: 8.9 mg/dL (ref 8.6–10.3)
CHLORIDE: 104 mmol/L (ref 98–110)
CO2: 26 mmol/L (ref 20–31)
Creat: 1.1 mg/dL (ref 0.70–1.33)
GFR, EST NON AFRICAN AMERICAN: 78 mL/min (ref >=60)
GFR, Est African American: >89 mL/min (ref >=60)
Glucose, Bld: 80 mg/dL (ref 65–99)
POTASSIUM: 4.1 mmol/L (ref 3.5–5.3)
SODIUM: 141 mmol/L (ref 135–146)

## 2015-06-11 MED ORDER — PREDNISONE 20 MG PO TABS: ORAL_TABLET | ORAL | Status: DC

## 2015-06-11 MED ORDER — AMPHETAMINE-DEXTROAMPHETAMINE 20 MG PO TABS: 20.0000 mg | ORAL_TABLET | Freq: Two times a day (BID) | ORAL | Status: DC

## 2015-06-11 MED ORDER — ALBUTEROL SULFATE HFA 108 (90 BASE) MCG/ACT IN AERS: 2.0000 | INHALATION_SPRAY | RESPIRATORY_TRACT | Status: DC | PRN

## 2015-06-11 MED ORDER — AZITHROMYCIN 250 MG PO TABS: ORAL_TABLET | ORAL | Status: AC

## 2015-06-11 NOTE — Patient Instructions (Signed)
Sinusitis can be uncomfortable. People with sinusitis have congestion with yellow/green/gray discharge, sinus pain/pressure, pain around the eyes. Sinus infections almost ALWAYS stem from a viral infection and antibiotics don't work against a virus. Even when bacteria is responsible, the infections usually clear up on their own in a week or so.   PLEASE TRY TO DO OVER THE COUNTER TREATMENT AND PREDNISONE FOR 5-7 DAYS AND IF YOU ARE NOT GETTING BETTER OR GETTING WORSE THEN YOU CAN START ON AN ANTIBIOTIC GIVEN.  Can take the prednisone AT NIGHT WITH DINNER, it take 8-12 hours to start working so it will NOT affect your sleeping if you take it at night with your food!! Take two pills the first night and 1 or two pill the second night and then 1 pill the other nights.   Risk of antibiotic use: About 1 in 4 people who take antibiotics have side effects including stomach problems, dizziness, or rashes. Those problems clear up soon after stopping the drugs, but in rare cases antibiotics can cause severe allergic reaction. Over use of antibiotics also encourages the growth of bacteria that can't be controlled easily with drugs. That makes you more vunerable to antibiotic-resistant infections and undermines the benefits of antibiotics for others.   Waste of Money: Antibiotics often aren't very expensive, but any money spent on unnecessary drugs is money down the drain.   When are antibiotics needed? Only when symptoms last longer than a week.  Start to improve but then worsen again  -It can take up to 2 weeks to feel better.   -If you do not get better in 7-10 days (Have fever, facial pain, dental pain and swelling), then please call the office and it is now appropriate to start an antibiotic.   -Please take Tylenol or Ibuprofen for pain. -Acetaminiphen 33m orally every 4-6 hours for pain.  Max: 10 per day -Ibuprofen 2084morally every 6-8 hours for pain.  Take with food to avoid ulcers.   Max 10 per  day  Please pick one of the over the counter allergy medications below and take it once daily for allergies.  Claritin or loratadine cheapest but likely the weakest  Zyrtec or certizine at night because it can make you sleepy The strongest is allegra or fexafinadine  Cheapest at [employer], [employer], [employer]  -While drinking fluids, pinch and hold nose close and swallow.  This will help open up your eustachian tubes to drain the fluid behind your ear drums. -Try steam showers to open your nasal passages.   Drink lots of water to stay hydrated and to thin mucous.  Flonase/Nasonex is to help the inflammation.  Take 2 sprays in each nostril at bedtime.  Make sure you spray towards the outside of each nostril towards the outer corner of your eye, hold nose close and tilt head back.  This will help the medication get into your sinuses.  If you do not like this medication, then use saline nasal sprays same directions as above for Flonase. Stop the medication right away if you get blurring of your vision or nose bleeds.  Sinusitis Sinusitis is redness, soreness, and inflammation of the paranasal sinuses. Paranasal sinuses are air pockets within the bones of your face (beneath the eyes, the middle of the forehead, or above the eyes). In healthy paranasal sinuses, mucus is able to drain out, and air is able to circulate through them by way of your nose. However, when your paranasal sinuses are inflamed, mucus and air can  become trapped. This can allow bacteria and other germs to grow and cause infection. Sinusitis can develop quickly and last only a short time (acute) or continue over a long period (chronic). Sinusitis that lasts for more than 12 weeks is considered chronic.  CAUSES  Causes of sinusitis include: Allergies. Structural abnormalities, such as displacement of the cartilage that separates your nostrils (deviated septum), which can decrease the air flow through your nose and sinuses and affect sinus  drainage. Functional abnormalities, such as when the small hairs (cilia) that line your sinuses and help remove mucus do not work properly or are not present. SIGNS AND SYMPTOMS  Symptoms of acute and chronic sinusitis are the same. The primary symptoms are pain and pressure around the affected sinuses. Other symptoms include: Upper toothache. Earache. Headache. Bad breath. Decreased sense of smell and taste. A cough, which worsens when you are lying flat. Fatigue. Fever. Thick drainage from your nose, which often is green and may contain pus (purulent). Swelling and warmth over the affected sinuses. DIAGNOSIS  Your health care provider will perform a physical exam. During the exam, your health care provider may: Look in your nose for signs of abnormal growths in your nostrils (nasal polyps).  Tap over the affected sinus to check for signs of infection. View the inside of your sinuses (endoscopy) using an imaging device that has a light attached (endoscope). If your health care provider suspects that you have chronic sinusitis, one or more of the following tests may be recommended: Allergy tests. Nasal culture. A sample of mucus is taken from your nose, sent to a lab, and screened for bacteria. Nasal cytology. A sample of mucus is taken from your nose and examined by your health care provider to determine if your sinusitis is related to an allergy. TREATMENT  Most cases of acute sinusitis are related to a viral infection and will resolve on their own within 10 days. Sometimes medicines are prescribed to help relieve symptoms (pain medicine, decongestants, nasal steroid sprays, or saline sprays).  However, for sinusitis related to a bacterial infection, your health care provider will prescribe antibiotic medicines. These are medicines that will help kill the bacteria causing the infection.  Rarely, sinusitis is caused by a fungal infection. In theses cases, your health care provider will  prescribe antifungal medicine. For some cases of chronic sinusitis, surgery is needed. Generally, these are cases in which sinusitis recurs more than 3 times per year, despite other treatments. HOME CARE INSTRUCTIONS  Drink plenty of water. Water helps thin the mucus so your sinuses can drain more easily. Use a humidifier. Inhale steam 3 to 4 times a day (for example, sit in the bathroom with the shower running). Apply a warm, moist washcloth to your face 3 to 4 times a day, or as directed by your health care provider. Use saline nasal sprays to help moisten and clean your sinuses. Take medicines only as directed by your health care provider. If you were prescribed either an antibiotic or antifungal medicine, finish it all even if you start to feel better. SEEK IMMEDIATE MEDICAL CARE IF: You have increasing pain or severe headaches. You have nausea, vomiting, or drowsiness. You have swelling around your face. You have vision problems. You have a stiff neck. You have difficulty breathing. MAKE SURE YOU:  Understand these instructions. Will watch your condition. Will get help right away if you are not doing well or get worse. Document Released: 05/05/2005 Document Revised: 09/19/2013 Document Reviewed: 05/20/2011 ExitCare  Patient Information 2015 [place]. This information is not intended to replace advice given to you by your health care provider. Make sure you discuss any questions you have with your health care provider.   If you have a smart phone, please look up Smoke Free app, this will help you stay on track and give you information about money you have saved, life that you have gained back and a ton of more information.   We are giving you chantix for smoking cessation. You can do it! And we are here to help! You may have heard some scary side effects about chantix, the three most common I hear about are nausea, crazy dreams and depression.  However, I like for my patients  to try to stay on 1/2 a tablet twice a day rather than one tablet twice a day as normally prescribed. This helps decrease the chances of side effects and helps save money by making a one month prescription last two months  Please start the prescription this way:  Start 1/2 tablet by mouth once daily after food with a full glass of water for 3 days Then do 1/2 tablet by mouth twice daily for 4 days. During this first week you can smoke, but try to stop after this week.  At this point we have several options: 1) continue on 1/2 tablet twice a day- which I encourage you to do. You can stay on this dose the rest of the time on the medication or if you still feel the need to smoke you can do one of the two options below. 2) do one tablet in the morning and 1/2 in the evening which helps decrease dreams. 3) do one tablet twice a day.   What if I miss a dose? If you miss a dose, take it as soon as you can. If it is almost time for your next dose, take only that dose. Do not take double or extra doses.  What should I watch for while using this medicine? Visit your doctor or health care professional for regular check ups. Ask for ongoing advice and encouragement from your doctor or healthcare professional, friends, and family to help you quit. If you smoke while on this medication, quit again  Your mouth may get dry. Chewing sugarless gum or hard candy, and drinking plenty of water may help. Contact your doctor if the problem does not go away or is severe.  You may get drowsy or dizzy. Do not drive, use machinery, or do anything that needs mental alertness until you know how this medicine affects you. Do not stand or sit up quickly, especially if you are an older patient.   The use of this medicine may increase the chance of suicidal thoughts or actions. Pay special attention to how you are responding while on this medicine. Any worsening of mood, or thoughts of suicide or dying should be reported to your  health care professional right away.  ADVANTAGES OF QUITTING SMOKING  Within 20 minutes, blood pressure decreases. Your pulse is at normal level.  After 8 hours, carbon monoxide levels in the blood return to normal. Your oxygen level increases.  After 24 hours, the chance of having a heart attack starts to decrease. Your breath, hair, and body stop smelling like smoke.  After 48 hours, damaged nerve endings begin to recover. Your sense of taste and smell improve.  After 72 hours, the body is virtually free of nicotine. Your bronchial tubes relax and breathing  becomes easier.  After 2 to 12 weeks, lungs can hold more air. Exercise becomes easier and circulation improves.  After 1 year, the risk of coronary heart disease is cut in half.  After 5 years, the risk of stroke falls to the same as a nonsmoker.  After 10 years, the risk of lung cancer is cut in half and the risk of other cancers decreases significantly.  After 15 years, the risk of coronary heart disease drops, usually to the level of a nonsmoker.  You will have extra money to spend on things other than cigarettes.

## 2015-06-11 NOTE — Progress Notes (Signed)
Assessment and Plan:  1. Hypertension -Continue medication, monitor blood pressure at home. Continue DASH diet.  Reminder to go to the ER if any CP, SOB, nausea, dizziness, severe HA, changes vision/speech, left arm numbness and tingling and jaw pain.  2. Cholesterol -Continue diet and exercise. Check cholesterol.   3. Smoking cessation-   instruction/counseling given, counseled patient on the dangers of tobacco use, advised patient to stop smoking, and reviewed strategies to maximize success, patient not ready to quit at this time.   4. Vitamin D Def - check level and continue medications.   5. URI Prednisone, zpak, albuterol inhaler, advised to quit smoking  Continue diet and meds as discussed. Further disposition pending results of labs. Over 30 minutes of exam, counseling, chart review, and critical decision making was performed  HPI 50 y.o. male  presents for 3 month follow up on hypertension, cholesterol, prediabetes, and vitamin D deficiency.   His blood pressure has been controlled at home, today their BP is BP: 126/64 mmHg  He has ASHD history with stent in 2010, on bASA, continue to smoke.  Had left partial nephrectomy 25/0539, no complications since that time.   He does not workout. He denies chest pain, shortness of breath, dizziness.  He is on cholesterol medication and denies myalgias. His cholesterol is at goal. The cholesterol last visit was:   Lab Results  Component Value Date   CHOL 89 12/04/2014   HDL 30* 12/04/2014   LDLCALC 39 12/04/2014   TRIG 100 12/04/2014   CHOLHDL 3.0 12/04/2014    He has been working on diet and exercise for prediabetes, and denies paresthesia of the feet, polydipsia, polyuria and visual disturbances. Last A1C in the office was:  Lab Results  Component Value Date   HGBA1C 5.5 06/19/2014   Patient is on Vitamin D supplement.   Lab Results  Component Value Date   VD25OH 39 12/04/2014     Patient also complains  of symptoms of a  URI. Symptoms include congestion, cough described as productive of yellow sputum, facial pain, low grade fever, nasal congestion, post nasal drip, shortness of breath, sinus pressure, sore throat and wheezing. Onset of symptoms was 2 weeks ago, and has been gradually worsening since that time. Treatment to date: none. Has been having neck pain with right finger numbness x 1 month, right shoulder pain x 1 month, has follow up ortho.  BMI is Body mass index is 33.95 kg/(m^2)., he is working on diet and exercise. Wt Readings from Last 3 Encounters:  06/11/15 230 lb (104.327 kg)  01/17/15 226 lb 3.2 oz (102.604 kg)  01/09/15 226 lb 3.2 oz (102.604 kg)    Current Medications:  Current Outpatient Prescriptions on File Prior to Visit  Medication Sig Dispense Refill  . amphetamine-dextroamphetamine (ADDERALL) 20 MG tablet Take 1 tablet (20 mg total) by mouth 2 (two) times daily. 60 tablet 0  . atorvastatin (LIPITOR) 80 MG tablet Take 1 tablet (80 mg total) by mouth daily. 90 tablet 0  . cephALEXin (KEFLEX) 500 MG capsule Take 1 capsule (500 mg total) by mouth 4 (four) times daily. 40 capsule 0  . cetirizine (ZYRTEC) 10 MG tablet Take 10 mg by mouth daily as needed for allergies.     Marland Kitchen HYDROcodone-acetaminophen (NORCO) 5-325 MG per tablet Take 1-2 tablets by mouth every 6 (six) hours as needed. (Patient taking differently: Take 1-2 tablets by mouth every 6 (six) hours as needed for moderate pain. ) 30 tablet 0  .  metoprolol (LOPRESSOR) 50 MG tablet Take 1 tablet (50 mg total) by mouth 2 (two) times daily. 180 tablet 0   No current facility-administered medications on file prior to visit.   Medical History:  Past Medical History  Diagnosis Date  . Hyperlipidemia   . Hypertension   . ADD (attention deficit disorder)   . ASHD (arteriosclerotic heart disease)   . Morbid obesity ([place])   . Positive for macroalbuminuria   . Crohn's disease ([place])   . Myocardial infarction [facility])     status post RCA  stenting in [facility] 2011  . Appendicitis   . Colitis   . GERD (gastroesophageal reflux disease)   . Left renal mass   . Eczema    Allergies: No Known Allergies   Review of Systems:  Review of Systems  Constitutional: Positive for malaise/fatigue. Negative for fever, chills and diaphoresis.  HENT: Positive for congestion and sore throat.   Eyes: Negative.   Respiratory: Positive for cough, sputum production and wheezing. Negative for hemoptysis and shortness of breath.   Cardiovascular: Negative.  Negative for chest pain and leg swelling.  Gastrointestinal: Negative.   Genitourinary: Negative.   Musculoskeletal: Positive for neck pain.  Neurological: Negative.   Psychiatric/Behavioral: Negative.     Family history- Review and unchanged Social history- Review and unchanged Physical Exam: BP 126/64 mmHg  Pulse 71  Temp(Src) 97.9 F (36.6 C) (Temporal)  Resp 16  Ht 5' 9"  (1.753 m)  Wt 230 lb (104.327 kg)  BMI 33.95 kg/m2  SpO2 97% Wt Readings from Last 3 Encounters:  06/11/15 230 lb (104.327 kg)  01/17/15 226 lb 3.2 oz (102.604 kg)  01/09/15 226 lb 3.2 oz (102.604 kg)   General Appearance: Well nourished, in no apparent distress. Eyes: PERRLA, EOMs, conjunctiva no swelling or erythema Sinuses: + Frontal/maxillary tenderness ENT/Mouth: Ext aud canals clear, TMs without erythema, bulging. No erythema, swelling, or exudate on post pharynx.  Tonsils not swollen or erythematous. Hearing normal.  Neck: Supple, thyroid normal.  Respiratory: Respiratory effort normal, BS equal bilaterally with wheezing without rales, rhonchi, or stridor.  Cardio: RRR with no MRGs. Brisk peripheral pulses without edema.  Abdomen: Soft, + BS,  Non tender, no guarding, rebound, hernias, masses. Lymphatics: Non tender without lymphadenopathy.  Musculoskeletal: Full ROM, 5/5 strength, Normal gait Skin: Warm, dry without rashes, lesions, ecchymosis.  Neuro: Cranial nerves intact. Normal muscle  tone, no cerebellar symptoms. Psych: Awake and oriented X 3, normal affect, Insight and Judgment appropriate.    Vicie Mutters, PA-C 2:45 PM [facility]

## 2015-06-12 LAB — TSH: TSH: 1.564 u[IU]/mL (ref 0.350–4.500)

## 2015-06-12 LAB — VITAMIN D 25 HYDROXY (VIT D DEFICIENCY, FRACTURES): VIT D 25 HYDROXY: 29 ng/mL — AB (ref 30–100)

## 2015-07-16 ENCOUNTER — Other Ambulatory Visit: Payer: Self-pay

## 2015-07-16 MED ORDER — METOPROLOL TARTRATE 50 MG PO TABS: 50.0000 mg | ORAL_TABLET | Freq: Two times a day (BID) | ORAL | Status: DC

## 2015-10-01 ENCOUNTER — Encounter: Payer: Self-pay | Admitting: Physician Assistant

## 2015-10-01 ENCOUNTER — Ambulatory Visit (INDEPENDENT_AMBULATORY_CARE_PROVIDER_SITE_OTHER): Payer: Federal, State, Local not specified - PPO | Admitting: Physician Assistant

## 2015-10-01 ENCOUNTER — Ambulatory Visit (HOSPITAL_COMMUNITY)
Admission: RE | Admit: 2015-10-01 | Discharge: 2015-10-01 | Disposition: A | Discharge status: Home / Self Care | Payer: Federal, State, Local not specified - PPO | Source: Ambulatory Visit | Attending: Physician Assistant | Admitting: Physician Assistant

## 2015-10-01 VITALS — BP 130/66 | HR 50 | Temp 97.5°F | Resp 16 | Ht 69.5 in | Wt 222.2 lb

## 2015-10-01 DIAGNOSIS — Z79899 Other long term (current) drug therapy: Secondary | ICD-10-CM | POA: Diagnosis not present

## 2015-10-01 DIAGNOSIS — R6889 Other general symptoms and signs: Secondary | ICD-10-CM

## 2015-10-01 DIAGNOSIS — F909 Attention-deficit hyperactivity disorder, unspecified type: Secondary | ICD-10-CM | POA: Diagnosis not present

## 2015-10-01 DIAGNOSIS — F17200 Nicotine dependence, unspecified, uncomplicated: Secondary | ICD-10-CM

## 2015-10-01 DIAGNOSIS — D649 Anemia, unspecified: Secondary | ICD-10-CM

## 2015-10-01 DIAGNOSIS — E785 Hyperlipidemia, unspecified: Secondary | ICD-10-CM

## 2015-10-01 DIAGNOSIS — Z0001 Encounter for general adult medical examination with abnormal findings: Secondary | ICD-10-CM

## 2015-10-01 DIAGNOSIS — E559 Vitamin D deficiency, unspecified: Secondary | ICD-10-CM | POA: Diagnosis not present

## 2015-10-01 DIAGNOSIS — N2889 Other specified disorders of kidney and ureter: Secondary | ICD-10-CM

## 2015-10-01 DIAGNOSIS — R062 Wheezing: Secondary | ICD-10-CM | POA: Insufficient documentation

## 2015-10-01 DIAGNOSIS — I10 Essential (primary) hypertension: Secondary | ICD-10-CM | POA: Insufficient documentation

## 2015-10-01 DIAGNOSIS — K50019 Crohn's disease of small intestine with unspecified complications: Secondary | ICD-10-CM

## 2015-10-01 DIAGNOSIS — I2510 Atherosclerotic heart disease of native coronary artery without angina pectoris: Secondary | ICD-10-CM

## 2015-10-01 DIAGNOSIS — Z131 Encounter for screening for diabetes mellitus: Secondary | ICD-10-CM

## 2015-10-01 DIAGNOSIS — F988 Other specified behavioral and emotional disorders with onset usually occurring in childhood and adolescence: Secondary | ICD-10-CM

## 2015-10-01 LAB — CBC WITH DIFFERENTIAL/PLATELET
BASOS PCT: 1 %
Basophils Absolute: 82 cells/uL (ref 0–200)
Eosinophils Absolute: 246 cells/uL (ref 15–500)
Eosinophils Relative: 3 %
HEMATOCRIT: 43.4 % (ref 38.5–50.0)
HEMOGLOBIN: 14.6 g/dL (ref 13.2–17.1)
LYMPHS ABS: 1394 {cells}/uL (ref 850–3900)
LYMPHS PCT: 17 %
MCH: 30.8 pg (ref 27.0–33.0)
MCHC: 33.6 g/dL (ref 32.0–36.0)
MCV: 91.6 fL (ref 80.0–100.0)
MONO ABS: 574 {cells}/uL (ref 200–950)
MPV: 10.6 fL (ref 7.5–12.5)
Monocytes Relative: 7 %
Neutro Abs: 5904 cells/uL (ref 1500–7800)
Neutrophils Relative %: 72 %
Platelets: 280 10*3/uL (ref 140–400)
RBC: 4.74 MIL/uL (ref 4.20–5.80)
RDW: 13.9 % (ref 11.0–15.0)
WBC: 8.2 10*3/uL (ref 3.8–10.8)

## 2015-10-01 LAB — HEMOGLOBIN A1C
HEMOGLOBIN A1C: 5.6 % (ref <5.7)
Mean Plasma Glucose: 114 mg/dL

## 2015-10-01 MED ORDER — AMPHETAMINE-DEXTROAMPHETAMINE 20 MG PO TABS: 20.0000 mg | ORAL_TABLET | Freq: Two times a day (BID) | ORAL | Status: DC

## 2015-10-01 MED ORDER — ATORVASTATIN CALCIUM 80 MG PO TABS: 80.0000 mg | ORAL_TABLET | Freq: Every day | ORAL | Status: DC

## 2015-10-01 MED ORDER — METOPROLOL TARTRATE 50 MG PO TABS: 50.0000 mg | ORAL_TABLET | Freq: Two times a day (BID) | ORAL | Status: DC

## 2015-10-01 NOTE — Patient Instructions (Addendum)
Your ears and sinuses are connected by the eustachian tube. When your sinuses are inflamed, this can close off the tube and cause fluid to collect in your middle ear. This can then cause dizziness, popping, clicking, ringing, and echoing in your ears. This is often NOT an infection and does NOT require antibiotics, it is caused by inflammation so the treatments help the inflammation. This can take a long time to get better so please be patient.  Here are things you can do to help with this: - Try the Flonase or Nasonex. Remember to spray each nostril twice towards the outer part of your eye.  Do not sniff but instead pinch your nose and tilt your head back to help the medicine get into your sinuses.  The best time to do this is at bedtime.Stop if you get blurred vision or nose bleeds.  -While drinking fluids, pinch and hold nose close and swallow, to help open eustachian tubes to drain fluid behind ear drums. -Please pick one of the over the counter allergy medications below and take it once daily for allergies.  It will also help with fluid behind ear drums. Claritin or loratadine cheapest but likely the weakest  Zyrtec or certizine at night because it can make you sleepy The strongest is allegra or fexafinadine  Cheapest at [employer], [employer], [employer] -can use decongestant over the counter, please do not use if you have high blood pressure or certain heart conditions.   if worsening HA, changes vision/speech, imbalance, weakness go to the ER  Common causes of cough OR hoarseness OR sore throat:   Allergies, Viral Infections, Acid Reflux and Bacterial Infections.  1) Allergies and viral infections cause a cough OR sore throat by post nasal drip and are often worse at night, can also have sneezing, lower grade fevers, clear/yellow mucus. This is best treated with allergy medications or nasal sprays.  Please get on allegra for 1-2 weeks The strongest is allegra or fexafinadine  Cheapest at [employer],  [employer], [employer]  2) Bacterial infections are more severe than allergies or viral infections with fever, teeth pain, fatigue. This can be treated with prednisone and the same over the counter medication and after 7 days can be treated with an antibiotic.   3) Silent reflux/GERD can cause a cough OR sore throat OR hoarseness WITHOUT heart burn because the esophagus that goes to the stomach and trachea that goes to the lungs are very close and when you lay down the acid can irritate your throat and lungs. This can cause hoarseness, cough, and wheezing. Please stop any alcohol or anti-inflammatories like aleve/advil/ibuprofen and start an over the counter Prilosec or omeprazole 1-2 times daily 17mns before food for 2 weeks, then switch to over the counter zantac/ratinidine or pepcid/famotadine once at night for 2 weeks.   4) sometimes irritation causes more irritation. Try voice rest, use sugar free cough drops to prevent coughing, and try to stop clearing your throat.   If you ever have a cough that does not go away after trying these things please make a follow up visit for further evaluation or we can refer you to a specialist. Or if you ever have shortness of breath or chest pain go to the ER.    ADD ZANTAC AT NIGHT  IF NOT BETTER NEED TO SEE EAR NOSE AND THROAT DOCTOR OR DR. PERRY TO GO HAVE A LOOK  If you have a smart phone, please look up Smoke Free app, this will help you  stay on track and give you information about money you have saved, life that you have gained back and a ton of more information.   We are giving you chantix for smoking cessation. You can do it! And we are here to help! You may have heard some scary side effects about chantix, the three most common I hear about are nausea, crazy dreams and depression.  However, I like for my patients to try to stay on 1/2 a tablet twice a day rather than one tablet twice a day as normally prescribed. This helps decrease the chances of side  effects and helps save money by making a one month prescription last two months  Please start the prescription this way:  Start 1/2 tablet by mouth once daily after food with a full glass of water for 3 days Then do 1/2 tablet by mouth twice daily for 4 days. During this first week you can smoke, but try to stop after this week.  At this point we have several options: 1) continue on 1/2 tablet twice a day- which I encourage you to do. You can stay on this dose the rest of the time on the medication or if you still feel the need to smoke you can do one of the two options below. 2) do one tablet in the morning and 1/2 in the evening which helps decrease dreams. 3) do one tablet twice a day.   What if I miss a dose? If you miss a dose, take it as soon as you can. If it is almost time for your next dose, take only that dose. Do not take double or extra doses.  What should I watch for while using this medicine? Visit your doctor or health care professional for regular check ups. Ask for ongoing advice and encouragement from your doctor or healthcare professional, friends, and family to help you quit. If you smoke while on this medication, quit again  Your mouth may get dry. Chewing sugarless gum or hard candy, and drinking plenty of water may help. Contact your doctor if the problem does not go away or is severe.  You may get drowsy or dizzy. Do not drive, use machinery, or do anything that needs mental alertness until you know how this medicine affects you. Do not stand or sit up quickly, especially if you are an older patient.   The use of this medicine may increase the chance of suicidal thoughts or actions. Pay special attention to how you are responding while on this medicine. Any worsening of mood, or thoughts of suicide or dying should be reported to your health care professional right away.  ADVANTAGES OF QUITTING SMOKING  Within 20 minutes, blood pressure decreases. Your pulse is at  normal level.  After 8 hours, carbon monoxide levels in the blood return to normal. Your oxygen level increases.  After 24 hours, the chance of having a heart attack starts to decrease. Your breath, hair, and body stop smelling like smoke.  After 48 hours, damaged nerve endings begin to recover. Your sense of taste and smell improve.  After 72 hours, the body is virtually free of nicotine. Your bronchial tubes relax and breathing becomes easier.  After 2 to 12 weeks, lungs can hold more air. Exercise becomes easier and circulation improves.  After 1 year, the risk of coronary heart disease is cut in half.  After 5 years, the risk of stroke falls to the same as a nonsmoker.  After 10 years,  the risk of lung cancer is cut in half and the risk of other cancers decreases significantly.  After 15 years, the risk of coronary heart disease drops, usually to the level of a nonsmoker.  You will have extra money to spend on things other than cigarettes.

## 2015-10-01 NOTE — Progress Notes (Signed)
Complete physical  Assessment and Plan:  1. Essential hypertension - CBC with Differential/Platelet - BASIC METABOLIC PANEL WITH GFR - Hepatic function panel - TSH - DG Chest 2 View; Future  2. ASHD (arteriosclerotic heart disease) Control blood pressure, cholesterol, glucose, increase exercise.  Stop smoking  3. Hyperlipidemia - Lipid panel  4. Crohn's disease of ileum, unspecified complication ([place]) Continue GI follow up  5. Vitamin D deficiency - VITAMIN D 25 Hydroxy (Vit-D Deficiency, Fractures)  6. Medication management - Magnesium  7. Smoker Advised to quit smoking, declines at this time  8. Left renal mass A/p nephrectomy, follow up urology  9. ADD (attention deficit disorder) - amphetamine-dextroamphetamine (ADDERALL) 20 MG tablet; Take 1 tablet (20 mg total) by mouth 2 (two) times daily.  Dispense: 60 tablet; Refill: 0  10. Screening for diabetes mellitus  - Hemoglobin A1c - Insulin, fasting - Urinalysis, Routine w reflex microscopic (not at [facility]) - Microalbumin / creatinine urine ratio  11. Anemia, unspecified anemia type - Iron and TIBC - Ferritin - Vitamin B12  12. Encounter for general adult medical examination with abnormal findings - CBC with Differential/Platelet - BASIC METABOLIC PANEL WITH GFR - Hepatic function panel - Lipid panel - Hemoglobin A1c - TSH - Insulin, fasting - Magnesium - VITAMIN D 25 Hydroxy (Vit-D Deficiency, Fractures) - Urinalysis, Routine w reflex microscopic (not at [facility]) - Microalbumin / creatinine urine ratio - Iron and TIBC - Ferritin - Vitamin B12 - DG Chest 2 View; Future   Continue diet and meds as discussed. Further disposition pending results of labs.  HPI 50 y.o. smoking whit male  presents for CPE and  3 month follow up with hypertension, hyperlipidemia, prediabetes, CAD s/p stent  and vitamin D.  His blood pressure has been controlled at home, today their BP is BP: 130/66 mmHg  He does not workout  but will shovel mulch. He denies chest pain, shortness of breath, dizziness.  He has a history of ASHD S/P Stenting in 2010, states that the time he did not have insurance and was only on plavix x 6 months after, has continued to smoke,, no longer does drugs. He is on ASA.  Denies dyspnea, exertional chest pressure/discomfort and irregular heart beat.   He is on cholesterol medication and denies myalgias. His cholesterol is at goal. The cholesterol last visit was:   Lab Results  Component Value Date   CHOL 111* 06/11/2015   HDL 26* 06/11/2015   LDLCALC 47 06/11/2015   TRIG 191* 06/11/2015   CHOLHDL 4.3 06/11/2015   He has been working on diet and exercise for prediabetes, and denies paresthesia of the feet, polydipsia, polyuria and visual disturbances. Last A1C in the office was:  Lab Results  Component Value Date   HGBA1C 5.5 06/19/2014  Patient is on Vitamin D supplement.  Recent diagnosis of Crohn's with Dr. [name], June 19th.  He also follows with Dr. Risa Grill for left renal mass found during crohn's evaluation, had left partial nephrectomy 01/17/2015 and had + renal cell carcinoma following with urology next month.  He has had some right hand weakness, with decreased use of his right 3 fingers, following with Dr. Nelva Bush, getting EMG to rule out ulnar neuropathy on the 27th.  He continues to smoke but would like to quit but continue to continue to smoke. Has done patches in the past and will likely try that.  He has had voice hoarseness x 2-3 months, intermittent, worse in the AM. Denies dysphagia, pain with  swallowing, or issues with pill/meat/bread. He is on an allergy pill, zyrtec for years, still having some allergy symptoms. Has had some heart burn but states this is normal for him.  BMI is Body mass index is 32.35 kg/(m^2)., he is working on diet and exercise. Wt Readings from Last 3 Encounters:  10/01/15 222 lb 3.2 oz (100.789 kg)  06/11/15 230 lb (104.327 kg)  01/17/15 226 lb 3.2 oz  (102.604 kg)    Current Medications:  Current Outpatient Prescriptions on File Prior to Visit  Medication Sig Dispense Refill  . albuterol (VENTOLIN HFA) 108 (90 Base) MCG/ACT inhaler Inhale 2 puffs into the lungs every 4 (four) hours as needed for wheezing or shortness of breath. 1 Inhaler 3  . amphetamine-dextroamphetamine (ADDERALL) 20 MG tablet Take 1 tablet (20 mg total) by mouth 2 (two) times daily. 60 tablet 0  . atorvastatin (LIPITOR) 80 MG tablet Take 1 tablet (80 mg total) by mouth daily. 90 tablet 0  . cetirizine (ZYRTEC) 10 MG tablet Take 10 mg by mouth daily as needed for allergies.     . metoprolol (LOPRESSOR) 50 MG tablet Take 1 tablet (50 mg total) by mouth 2 (two) times daily. 180 tablet 0   No current facility-administered medications on file prior to visit.   Medical History:  Past Medical History  Diagnosis Date  . Hyperlipidemia   . Hypertension   . ADD (attention deficit disorder)   . ASHD (arteriosclerotic heart disease)   . Morbid obesity ([place])   . Positive for macroalbuminuria   . Crohn's disease ([place])   . Myocardial infarction [facility])     status post RCA stenting in [facility] 2011  . Appendicitis   . Colitis   . GERD (gastroesophageal reflux disease)   . Left renal mass   . Eczema    Allergies: No Known Allergies   Surgical history Past Surgical History  Procedure Laterality Date  . Appendectomy    . Coronary stent placement      x 1  . Colon surgery    . Robotic assited partial nephrectomy Left 01/17/2015    Procedure: ROBOTIC ASSITED LAPAROSCOPIC LEFT  PARTIAL  NEPHRECTOMY;  Surgeon: Ardis Hughs, MD;  Location: [facility];  Service: Urology;  Laterality: Left;   Family history Family History  Problem Relation Age of Onset  . Diabetes Maternal Grandmother    Immunization History  Administered Date(s) Administered  . Td 05/19/2009   Colonoscopy 02/2014 Ct AB pelvis 03/17/2014 CXR 10/2013 MRI AB 08/2014 Echo 12/2014  Dr. Sabra Heck eye,  2-3 months  Review of Systems:  Review of Systems  Constitutional: Negative.   HENT: Negative.   Eyes: Negative.   Respiratory: Negative.   Cardiovascular: Negative.   Gastrointestinal: Negative.   Genitourinary: Negative.   Musculoskeletal: Negative.   Skin: Negative.   Neurological: Negative.   Endo/Heme/Allergies: Negative.   Psychiatric/Behavioral: Negative.    Physical Exam: BP 130/66 mmHg  Pulse 50  Temp(Src) 97.5 F (36.4 C) (Temporal)  Resp 16  Ht 5' 9.5" (1.765 m)  Wt 222 lb 3.2 oz (100.789 kg)  BMI 32.35 kg/m2  SpO2 97% Wt Readings from Last 3 Encounters:  10/01/15 222 lb 3.2 oz (100.789 kg)  06/11/15 230 lb (104.327 kg)  01/17/15 226 lb 3.2 oz (102.604 kg)   General Appearance: Well nourished, in no apparent distress. Eyes: PERRLA, EOMs, conjunctiva no swelling or erythema Sinuses: No Frontal/maxillary tenderness ENT/Mouth: Ext aud canals clear, TMs without erythema, bulging. No erythema, swelling,  or exudate on post pharynx.  Tonsils not swollen or erythematous. Hearing normal.  Neck: Supple, thyroid normal.  Respiratory: Respiratory effort normal, BS equal bilaterally without rales, rhonchi, wheezing or stridor.  Cardio: RRR with no MRGs. Brisk peripheral pulses without edema.  Abdomen: Soft, + BS, obese,  Non tender, no guarding, rebound, hernias, masses. Lymphatics: Non tender without lymphadenopathy.  Musculoskeletal: Full ROM, 5/5 strength, Normal Skin: Several healing scars along AB. Warm, dry without rashes, lesions, ecchymosis.  Neuro: Cranial nerves intact. Normal muscle tone except some minor muscle wasting lateral right hand with decrease extension of 3 lateral fingers, normal distal vascular, no cerebellar symptoms. Psych: Awake and oriented X 3, normal affect, Insight and Judgment appropriate.   EKG defer Aorta Scan defer  Vicie Mutters, PA-C 9:31 AM [facility]

## 2015-10-02 LAB — MICROALBUMIN / CREATININE URINE RATIO
Creatinine, Urine: 221 mg/dL (ref 20–370)
MICROALB/CREAT RATIO: 75 ug/mg{creat} — AB (ref <30)
Microalb, Ur: 16.6 mg/dL

## 2015-10-02 LAB — URINALYSIS, ROUTINE W REFLEX MICROSCOPIC
BILIRUBIN URINE: NEGATIVE
GLUCOSE, UA: NEGATIVE
Hgb urine dipstick: NEGATIVE
Ketones, ur: NEGATIVE
LEUKOCYTES UA: NEGATIVE
Nitrite: NEGATIVE
PH: 5.5 (ref 5.0–8.0)
SPECIFIC GRAVITY, URINE: 1.019 (ref 1.001–1.035)

## 2015-10-02 LAB — VITAMIN B12: Vitamin B-12: 423 pg/mL (ref 200–1100)

## 2015-10-02 LAB — HEPATIC FUNCTION PANEL
ALK PHOS: 104 U/L (ref 40–115)
ALT: 25 U/L (ref 9–46)
AST: 18 U/L (ref 10–35)
Albumin: 4.2 g/dL (ref 3.6–5.1)
BILIRUBIN DIRECT: 0.1 mg/dL (ref <=0.2)
BILIRUBIN INDIRECT: 0.3 mg/dL (ref 0.2–1.2)
TOTAL PROTEIN: 7.1 g/dL (ref 6.1–8.1)
Total Bilirubin: 0.4 mg/dL (ref 0.2–1.2)

## 2015-10-02 LAB — IRON AND TIBC
%SAT: 29 % (ref 15–60)
IRON: 100 ug/dL (ref 50–180)
TIBC: 350 ug/dL (ref 250–425)
UIBC: 250 ug/dL (ref 125–400)

## 2015-10-02 LAB — BASIC METABOLIC PANEL WITH GFR
BUN: 15 mg/dL (ref 7–25)
CO2: 24 mmol/L (ref 20–31)
Calcium: 9.4 mg/dL (ref 8.6–10.3)
Chloride: 107 mmol/L (ref 98–110)
Creat: 0.85 mg/dL (ref 0.70–1.33)
GLUCOSE: 89 mg/dL (ref 65–99)
POTASSIUM: 4.6 mmol/L (ref 3.5–5.3)
Sodium: 138 mmol/L (ref 135–146)

## 2015-10-02 LAB — LIPID PANEL
CHOL/HDL RATIO: 3.6 ratio (ref <=5.0)
Cholesterol: 114 mg/dL — ABNORMAL LOW (ref 125–200)
HDL: 32 mg/dL — AB (ref >=40)
LDL CALC: 61 mg/dL (ref <130)
Triglycerides: 104 mg/dL (ref <150)
VLDL: 21 mg/dL (ref <30)

## 2015-10-02 LAB — TSH: TSH: 1.27 m[IU]/L (ref 0.40–4.50)

## 2015-10-02 LAB — URINALYSIS, MICROSCOPIC ONLY
BACTERIA UA: NONE SEEN [HPF]
Casts: NONE SEEN [LPF]
Crystals: NONE SEEN [HPF]
RBC / HPF: NONE SEEN RBC/HPF (ref <=2)
SQUAMOUS EPITHELIAL / LPF: NONE SEEN [HPF] (ref <=5)
WBC, UA: NONE SEEN WBC/HPF (ref <=5)
YEAST: NONE SEEN [HPF]

## 2015-10-02 LAB — VITAMIN D 25 HYDROXY (VIT D DEFICIENCY, FRACTURES): VIT D 25 HYDROXY: 37 ng/mL (ref 30–100)

## 2015-10-02 LAB — FERRITIN: FERRITIN: 78 ng/mL (ref 20–380)

## 2015-10-02 LAB — MAGNESIUM: Magnesium: 1.9 mg/dL (ref 1.5–2.5)

## 2015-10-02 LAB — INSULIN, FASTING: Insulin fasting, serum: 7.3 u[IU]/mL (ref 2.0–19.6)

## 2015-11-12 ENCOUNTER — Ambulatory Visit: Payer: Federal, State, Local not specified - PPO | Admitting: Internal Medicine

## 2015-12-31 ENCOUNTER — Encounter: Payer: Self-pay | Admitting: Internal Medicine

## 2015-12-31 ENCOUNTER — Ambulatory Visit (INDEPENDENT_AMBULATORY_CARE_PROVIDER_SITE_OTHER): Payer: Federal, State, Local not specified - PPO | Admitting: Internal Medicine

## 2015-12-31 ENCOUNTER — Other Ambulatory Visit (INDEPENDENT_AMBULATORY_CARE_PROVIDER_SITE_OTHER): Payer: Federal, State, Local not specified - PPO

## 2015-12-31 VITALS — BP 110/80 | HR 60 | Ht 69.0 in | Wt 219.4 lb

## 2015-12-31 DIAGNOSIS — K50019 Crohn's disease of small intestine with unspecified complications: Secondary | ICD-10-CM

## 2015-12-31 DIAGNOSIS — K5010 Crohn's disease of large intestine without complications: Secondary | ICD-10-CM

## 2015-12-31 LAB — VITAMIN B12: Vitamin B-12: 897 pg/mL (ref 211–911)

## 2015-12-31 LAB — HIGH SENSITIVITY CRP: CRP HIGH SENSITIVITY: 5.58 mg/L — AB (ref 0.000–5.000)

## 2015-12-31 NOTE — Patient Instructions (Signed)
Your physician has requested that you go to the basement for the following lab work before leaving today:  CRP, B12  Please follow up in one year

## 2015-12-31 NOTE — Progress Notes (Signed)
HISTORY OF PRESENT ILLNESS:  Cole [name] is a 50 y.o. male with Crohn's disease who is status post ileocecectomy in [place] August 2011 when he presented with suppurative appendicitis with associated phlegmon. Pathology revealed changes consistent with Crohn disease. I saw him initially 01/09/2014 for chronic diarrhea and the need for colonoscopy. See that dictation. Complete colonoscopy was performed 02/20/2014. The ileocolonic anastomosis was stenotic and ulcerated. Biopsies were consistent with inflammatory bowel disease. Subsequent small bowel follow-through revealed changes consistent with Crohn's disease involving the terminal ileum (5 cm segment). Subsequent CT scan of the abdomen and pelvis revealed some inflammation at the level of the anastomosis. Otherwise, no overwhelming abnormalities. He did have adrenal and renal lesions which were evaluated elsewhere. Last seen in follow-up 06/12/2014. At that time he was asymptomatic denying abdominal pain or diarrhea. B12 and C reactive protein levels were normal. He was told to follow-up in 6 months but did not follow-up. He presents today for follow-up at the urging of his wife and PCP. Patient denies to me any GI complaints. He does notice occasional loose stool with dietary indiscretion but reports that the overwhelming majority of the time he has 1 formed bowel movement daily. He denies abdominal pain. No bleeding. No fevers.  REVIEW OF SYSTEMS:  All non-GI ROS negative except for eczema  Past Medical History:  Diagnosis Date  . ADD (attention deficit disorder)   . Appendicitis   . ASHD (arteriosclerotic heart disease)   . Colitis   . Crohn's disease ([place])   . Eczema   . GERD (gastroesophageal reflux disease)   . Hyperlipidemia   . Hypertension   . Left renal mass   . Morbid obesity ([place])   . Myocardial infarction [facility])    status post RCA stenting in [facility] 2011  . Positive for macroalbuminuria     Past  Surgical History:  Procedure Laterality Date  . APPENDECTOMY    . COLON SURGERY    . CORONARY STENT PLACEMENT     x 1  . ELBOW SURGERY    . ROBOTIC ASSITED PARTIAL NEPHRECTOMY Left 01/17/2015   Procedure: ROBOTIC ASSITED LAPAROSCOPIC LEFT  PARTIAL  NEPHRECTOMY;  Surgeon: Ardis Hughs, MD;  Location: [facility];  Service: Urology;  Laterality: Left;    Social History Cole [name]  reports that he has been smoking.  He has a 30.00 pack-year smoking history. He has never used smokeless tobacco. He reports that he uses drugs, including Marijuana. He reports that he does not drink alcohol.  family history includes Diabetes in his maternal grandmother.  No Known Allergies     PHYSICAL EXAMINATION: Vital signs: BP 110/80 (BP Location: Left Arm, Patient Position: Sitting, Cuff Size: Normal)   Pulse 60   Ht 5' 9"  (1.753 m)   Wt 219 lb 6.4 oz (99.5 kg)   BMI 32.40 kg/m   Constitutional: generally well-appearing, no acute distress Psychiatric: alert and oriented x3, cooperative Eyes: extraocular movements intact, anicteric, conjunctiva pink Mouth: oral pharynx moist, no lesions Neck: supple no lymphadenopathy Cardiovascular: heart regular rate and rhythm, no murmur Lungs: clear to auscultation bilaterally Abdomen: soft, nontender, nondistended, no obvious ascites, no peritoneal signs, normal bowel sounds, no organomegaly. Prior surgical incision well-healed Rectal: Omitted Extremities: no clubbing cyanosis or lower extremity edema bilaterally Skin: no lesions on visible extremities Neuro: No focal deficits. Cranial nerves intact  ASSESSMENT:  #1. Ileal Crohn's disease. Initial presentation August 2011 with acute suppurative appendicitis. Follow-up colonoscopy October 2015 in addition  to imaging studies suggesting diseased 5 cm segment of the neo-ileum. Patient last evaluated 18 months ago. Remains asymptomatic.   PLAN:  #1. B12 level and C-reactive protein today #2. Active  surveillance per patient wishes. Not interested in therapy. High risk for recurrent symptomatic disease #3. Routine office follow-up in 1 year. Contact the office in the interim for questions, problems, or relevant symptoms  25 minutes was spent face-to-face with the patient. Greater than 50% a time use for counseling and management options regarding his Crohn's disease

## 2016-01-14 ENCOUNTER — Encounter: Payer: Self-pay | Admitting: Physician Assistant

## 2016-01-14 ENCOUNTER — Ambulatory Visit: Payer: Self-pay | Admitting: Physician Assistant

## 2016-01-14 ENCOUNTER — Ambulatory Visit (INDEPENDENT_AMBULATORY_CARE_PROVIDER_SITE_OTHER): Payer: Federal, State, Local not specified - PPO | Admitting: Physician Assistant

## 2016-01-14 VITALS — BP 138/70 | HR 69 | Temp 97.2°F | Resp 16 | Ht 69.0 in | Wt 217.2 lb

## 2016-01-14 DIAGNOSIS — E785 Hyperlipidemia, unspecified: Secondary | ICD-10-CM

## 2016-01-14 DIAGNOSIS — I2510 Atherosclerotic heart disease of native coronary artery without angina pectoris: Secondary | ICD-10-CM | POA: Diagnosis not present

## 2016-01-14 DIAGNOSIS — I10 Essential (primary) hypertension: Secondary | ICD-10-CM | POA: Diagnosis not present

## 2016-01-14 DIAGNOSIS — F17200 Nicotine dependence, unspecified, uncomplicated: Secondary | ICD-10-CM

## 2016-01-14 DIAGNOSIS — Z720 Tobacco use: Secondary | ICD-10-CM

## 2016-01-14 DIAGNOSIS — E559 Vitamin D deficiency, unspecified: Secondary | ICD-10-CM | POA: Diagnosis not present

## 2016-01-14 DIAGNOSIS — Z79899 Other long term (current) drug therapy: Secondary | ICD-10-CM | POA: Diagnosis not present

## 2016-01-14 DIAGNOSIS — F988 Other specified behavioral and emotional disorders with onset usually occurring in childhood and adolescence: Secondary | ICD-10-CM

## 2016-01-14 LAB — LIPID PANEL
CHOL/HDL RATIO: 3.5 ratio (ref <=5.0)
Cholesterol: 107 mg/dL — ABNORMAL LOW (ref 125–200)
HDL: 31 mg/dL — AB (ref >=40)
LDL CALC: 52 mg/dL (ref <130)
Triglycerides: 120 mg/dL (ref <150)
VLDL: 24 mg/dL (ref <30)

## 2016-01-14 LAB — BASIC METABOLIC PANEL WITH GFR
BUN: 16 mg/dL (ref 7–25)
CHLORIDE: 105 mmol/L (ref 98–110)
CO2: 25 mmol/L (ref 20–31)
CREATININE: 0.88 mg/dL (ref 0.70–1.33)
Calcium: 9.2 mg/dL (ref 8.6–10.3)
GFR, Est African American: >89 mL/min (ref >=60)
GFR, Est Non African American: >89 mL/min (ref >=60)
GLUCOSE: 81 mg/dL (ref 65–99)
Potassium: 4.6 mmol/L (ref 3.5–5.3)
Sodium: 136 mmol/L (ref 135–146)

## 2016-01-14 LAB — CBC WITH DIFFERENTIAL/PLATELET
BASOS PCT: 0 %
Basophils Absolute: 0 cells/uL (ref 0–200)
EOS PCT: 2 %
Eosinophils Absolute: 212 cells/uL (ref 15–500)
HCT: 41.5 % (ref 38.5–50.0)
HEMOGLOBIN: 14.1 g/dL (ref 13.2–17.1)
LYMPHS ABS: 1590 {cells}/uL (ref 850–3900)
Lymphocytes Relative: 15 %
MCH: 31.2 pg (ref 27.0–33.0)
MCHC: 34 g/dL (ref 32.0–36.0)
MCV: 91.8 fL (ref 80.0–100.0)
MPV: 11.1 fL (ref 7.5–12.5)
Monocytes Absolute: 848 cells/uL (ref 200–950)
Monocytes Relative: 8 %
NEUTROS ABS: 7950 {cells}/uL — AB (ref 1500–7800)
Neutrophils Relative %: 75 %
Platelets: 283 10*3/uL (ref 140–400)
RBC: 4.52 MIL/uL (ref 4.20–5.80)
RDW: 14.2 % (ref 11.0–15.0)
WBC: 10.6 10*3/uL (ref 3.8–10.8)

## 2016-01-14 LAB — HEPATIC FUNCTION PANEL
ALBUMIN: 4.1 g/dL (ref 3.6–5.1)
ALT: 29 U/L (ref 9–46)
AST: 19 U/L (ref 10–35)
Alkaline Phosphatase: 107 U/L (ref 40–115)
BILIRUBIN TOTAL: 0.4 mg/dL (ref 0.2–1.2)
Bilirubin, Direct: 0.1 mg/dL (ref <=0.2)
Indirect Bilirubin: 0.3 mg/dL (ref 0.2–1.2)
TOTAL PROTEIN: 7.1 g/dL (ref 6.1–8.1)

## 2016-01-14 LAB — TSH: TSH: 1.34 m[IU]/L (ref 0.40–4.50)

## 2016-01-14 LAB — MAGNESIUM: MAGNESIUM: 1.7 mg/dL (ref 1.5–2.5)

## 2016-01-14 MED ORDER — AMPHETAMINE-DEXTROAMPHETAMINE 20 MG PO TABS: 20.0000 mg | ORAL_TABLET | Freq: Two times a day (BID) | ORAL | 0 refills | Status: DC

## 2016-01-14 NOTE — Progress Notes (Signed)
Assessment and Plan:  1. Hypertension -Continue medication, monitor blood pressure at home. Continue DASH diet.  Reminder to go to the ER if any CP, SOB, nausea, dizziness, severe HA, changes vision/speech, left arm numbness and tingling and jaw pain.  2. Cholesterol -Continue diet and exercise. Check cholesterol.   3. Smoking cessation-   instruction/counseling given, counseled patient on the dangers of tobacco use, advised patient to stop smoking, and reviewed strategies to maximize success, patient not ready to quit at this time.   4. Vitamin D Def - check level and continue medications.   5. ADD Printed out, taking appropriately  Continue diet and meds as discussed. Further disposition pending results of labs. Over 30 minutes of exam, counseling, chart review, and critical decision making was performed  HPI 50 y.o. male  presents for 3 month follow up on hypertension, cholesterol, prediabetes, and vitamin D deficiency.   His blood pressure has been controlled at home, today their BP is BP: 138/70  He has ASHD history with stent in 2010, on bASA, continue to smoke.  Had left partial nephrectomy 57/3220, no complications since that time.   He does not workout. He denies chest pain, shortness of breath, dizziness. He takes adderall just during the week, 1-2 x a day, and only 3-4 days a week.   He is on cholesterol medication and denies myalgias. His cholesterol is at goal. The cholesterol last visit was:   Lab Results  Component Value Date   CHOL 114 (L) 10/01/2015   HDL 32 (L) 10/01/2015   LDLCALC 61 10/01/2015   TRIG 104 10/01/2015   CHOLHDL 3.6 10/01/2015    He has been working on diet and exercise for prediabetes, and denies paresthesia of the feet, polydipsia, polyuria and visual disturbances. Last A1C in the office was:  Lab Results  Component Value Date   HGBA1C 5.6 10/01/2015   Patient is on Vitamin D supplement.   Lab Results  Component Value Date   VD25OH 37  10/01/2015     Still following Dr. Nelva Bush for his right ulnar neuropathy, has had nerve damage.  BMI is Body mass index is 32.07 kg/m., he is working on diet and exercise. Wt Readings from Last 3 Encounters:  01/14/16 217 lb 3.2 oz (98.5 kg)  12/31/15 219 lb 6.4 oz (99.5 kg)  10/01/15 222 lb 3.2 oz (100.8 kg)    Current Medications:  Current Outpatient Prescriptions on File Prior to Visit  Medication Sig Dispense Refill  . amphetamine-dextroamphetamine (ADDERALL) 20 MG tablet Take 1 tablet (20 mg total) by mouth 2 (two) times daily. 60 tablet 0  . atorvastatin (LIPITOR) 80 MG tablet Take 1 tablet (80 mg total) by mouth daily. 90 tablet 0  . cetirizine (ZYRTEC) 10 MG tablet Take 10 mg by mouth daily as needed for allergies.     . metoprolol (LOPRESSOR) 50 MG tablet Take 1 tablet (50 mg total) by mouth 2 (two) times daily. 180 tablet 0   No current facility-administered medications on file prior to visit.    Medical History:  Past Medical History:  Diagnosis Date  . ADD (attention deficit disorder)   . Appendicitis   . ASHD (arteriosclerotic heart disease)   . Colitis   . Crohn's disease ([place])   . Eczema   . GERD (gastroesophageal reflux disease)   . Hyperlipidemia   . Hypertension   . Left renal mass   . Morbid obesity ([place])   . Myocardial infarction [facility])    status  post RCA stenting in [facility] 2011  . Positive for macroalbuminuria    Allergies: No Known Allergies   Review of Systems:  Review of Systems  Constitutional: Negative for chills, diaphoresis, fever and malaise/fatigue.  HENT: Negative for congestion and sore throat.   Eyes: Negative.   Respiratory: Negative for cough, hemoptysis, sputum production, shortness of breath and wheezing.   Cardiovascular: Negative.  Negative for chest pain and leg swelling.  Gastrointestinal: Negative.   Genitourinary: Negative.   Musculoskeletal: Negative for neck pain.  Neurological: Negative.   Psychiatric/Behavioral:  Negative.     Family history- Review and unchanged Social history- Review and unchanged Physical Exam: BP 138/70   Pulse 69   Temp 97.2 F (36.2 C)   Resp 16   Ht 5' 9"  (1.753 m)   Wt 217 lb 3.2 oz (98.5 kg)   SpO2 97%   BMI 32.07 kg/m  Wt Readings from Last 3 Encounters:  01/14/16 217 lb 3.2 oz (98.5 kg)  12/31/15 219 lb 6.4 oz (99.5 kg)  10/01/15 222 lb 3.2 oz (100.8 kg)   General Appearance: Well nourished, in no apparent distress. Eyes: PERRLA, EOMs, conjunctiva no swelling or erythema Sinuses: + Frontal/maxillary tenderness ENT/Mouth: Ext aud canals clear, TMs without erythema, bulging. No erythema, swelling, or exudate on post pharynx.  Tonsils not swollen or erythematous. Hearing normal.  Neck: Supple, thyroid normal.  Respiratory: Respiratory effort normal, BS equal bilaterally with wheezing without rales, rhonchi, or stridor.  Cardio: RRR with no MRGs. Brisk peripheral pulses without edema.  Abdomen: Soft, + BS,  Non tender, no guarding, rebound, hernias, masses. Lymphatics: Non tender without lymphadenopathy.  Musculoskeletal: Full ROM, 5/5 strength, Normal gait, right hand in soft cast Skin: Warm, dry without rashes, lesions, ecchymosis.  Neuro: Cranial nerves intact. Normal muscle tone, no cerebellar symptoms. Psych: Awake and oriented X 3, normal affect, Insight and Judgment appropriate.    Vicie Mutters, PA-C 10:32 AM [facility]

## 2016-01-15 LAB — VITAMIN D 25 HYDROXY (VIT D DEFICIENCY, FRACTURES): Vit D, 25-Hydroxy: 49 ng/mL (ref 30–100)

## 2016-02-11 ENCOUNTER — Other Ambulatory Visit: Payer: Self-pay

## 2016-02-11 MED ORDER — METOPROLOL TARTRATE 50 MG PO TABS: 50.0000 mg | ORAL_TABLET | Freq: Two times a day (BID) | ORAL | 0 refills | Status: DC

## 2016-02-11 MED ORDER — ATORVASTATIN CALCIUM 80 MG PO TABS: 80.0000 mg | ORAL_TABLET | Freq: Every day | ORAL | 0 refills | Status: DC

## 2016-03-20 ENCOUNTER — Other Ambulatory Visit: Payer: Self-pay | Admitting: Physician Assistant

## 2016-03-20 DIAGNOSIS — F909 Attention-deficit hyperactivity disorder, unspecified type: Secondary | ICD-10-CM

## 2016-03-20 MED ORDER — LIDOCAINE 5 % EX PTCH: 1.0000 | MEDICATED_PATCH | CUTANEOUS | 3 refills | Status: DC

## 2016-03-20 MED ORDER — AMPHETAMINE-DEXTROAMPHETAMINE 20 MG PO TABS: 20.0000 mg | ORAL_TABLET | Freq: Two times a day (BID) | ORAL | 0 refills | Status: DC

## 2016-04-25 ENCOUNTER — Encounter: Payer: Self-pay | Admitting: Internal Medicine

## 2016-04-28 ENCOUNTER — Ambulatory Visit: Payer: Self-pay | Admitting: Physician Assistant

## 2016-04-28 NOTE — Progress Notes (Deleted)
Assessment and Plan:  1. Hypertension -Continue medication, monitor blood pressure at home. Continue DASH diet.  Reminder to go to the ER if any CP, SOB, nausea, dizziness, severe HA, changes vision/speech, left arm numbness and tingling and jaw pain.  2. Cholesterol -Continue diet and exercise. Check cholesterol.   3. Smoking cessation-   instruction/counseling given, counseled patient on the dangers of tobacco use, advised patient to stop smoking, and reviewed strategies to maximize success, patient not ready to quit at this time.   4. Vitamin D Def - check level and continue medications.   5. ADD Printed out, taking appropriately  Continue diet and meds as discussed. Further disposition pending results of labs. Over 30 minutes of exam, counseling, chart review, and critical decision making was performed  HPI 50 y.o. male  presents for 3 month follow up on hypertension, cholesterol, prediabetes, and vitamin D deficiency.   His blood pressure has been controlled at home, today their BP is    He has ASHD history with stent in 2010, on bASA, continue to smoke.  Had left partial nephrectomy 31/5176, no complications since that time.   He does not workout. He denies chest pain, shortness of breath, dizziness. He takes adderall just during the week, 1-2 x a day, and only 3-4 days a week.   He is on cholesterol medication and denies myalgias. His cholesterol is at goal. The cholesterol last visit was:   Lab Results  Component Value Date   CHOL 107 (L) 01/14/2016   HDL 31 (L) 01/14/2016   LDLCALC 52 01/14/2016   TRIG 120 01/14/2016   CHOLHDL 3.5 01/14/2016    He has been working on diet and exercise for prediabetes, and denies paresthesia of the feet, polydipsia, polyuria and visual disturbances. Last A1C in the office was:  Lab Results  Component Value Date   HGBA1C 5.6 10/01/2015   Patient is on Vitamin D supplement.   Lab Results  Component Value Date   VD25OH 49 01/14/2016     Still following Dr. Nelva Bush for his right ulnar neuropathy, has had nerve damage.  BMI is There is no height or weight on file to calculate BMI., he is working on diet and exercise. Wt Readings from Last 3 Encounters:  01/14/16 217 lb 3.2 oz (98.5 kg)  12/31/15 219 lb 6.4 oz (99.5 kg)  10/01/15 222 lb 3.2 oz (100.8 kg)    Current Medications:  Current Outpatient Prescriptions on File Prior to Visit  Medication Sig Dispense Refill  . amphetamine-dextroamphetamine (ADDERALL) 20 MG tablet Take 1 tablet (20 mg total) by mouth 2 (two) times daily. 60 tablet 0  . atorvastatin (LIPITOR) 80 MG tablet Take 1 tablet (80 mg total) by mouth daily. 90 tablet 0  . cetirizine (ZYRTEC) 10 MG tablet Take 10 mg by mouth daily as needed for allergies.     Marland Kitchen lidocaine (LIDODERM) 5 % Place 1 patch onto the skin daily. Remove & Discard patch within 12 hours or as directed by MD 30 patch 3  . metoprolol (LOPRESSOR) 50 MG tablet Take 1 tablet (50 mg total) by mouth 2 (two) times daily. 180 tablet 0   No current facility-administered medications on file prior to visit.    Medical History:  Past Medical History:  Diagnosis Date  . ADD (attention deficit disorder)   . Appendicitis   . ASHD (arteriosclerotic heart disease)   . Colitis   . Crohn's disease ([place])   . Eczema   . GERD (gastroesophageal  reflux disease)   . Hyperlipidemia   . Hypertension   . Left renal mass   . Morbid obesity ([place])   . Myocardial infarction    status post RCA stenting in [facility] 2011  . Positive for macroalbuminuria    Allergies: No Known Allergies   Review of Systems:  Review of Systems  Constitutional: Negative for chills, diaphoresis, fever and malaise/fatigue.  HENT: Negative for congestion and sore throat.   Eyes: Negative.   Respiratory: Negative for cough, hemoptysis, sputum production, shortness of breath and wheezing.   Cardiovascular: Negative.  Negative for chest pain and leg swelling.  Gastrointestinal:  Negative.   Genitourinary: Negative.   Musculoskeletal: Negative for neck pain.  Neurological: Negative.   Psychiatric/Behavioral: Negative.     Family history- Review and unchanged Social history- Review and unchanged Physical Exam: There were no vitals taken for this visit. Wt Readings from Last 3 Encounters:  01/14/16 217 lb 3.2 oz (98.5 kg)  12/31/15 219 lb 6.4 oz (99.5 kg)  10/01/15 222 lb 3.2 oz (100.8 kg)   General Appearance: Well nourished, in no apparent distress. Eyes: PERRLA, EOMs, conjunctiva no swelling or erythema Sinuses: + Frontal/maxillary tenderness ENT/Mouth: Ext aud canals clear, TMs without erythema, bulging. No erythema, swelling, or exudate on post pharynx.  Tonsils not swollen or erythematous. Hearing normal.  Neck: Supple, thyroid normal.  Respiratory: Respiratory effort normal, BS equal bilaterally with wheezing without rales, rhonchi, or stridor.  Cardio: RRR with no MRGs. Brisk peripheral pulses without edema.  Abdomen: Soft, + BS,  Non tender, no guarding, rebound, hernias, masses. Lymphatics: Non tender without lymphadenopathy.  Musculoskeletal: Full ROM, 5/5 strength, Normal gait, right hand in soft cast Skin: Warm, dry without rashes, lesions, ecchymosis.  Neuro: Cranial nerves intact. Normal muscle tone, no cerebellar symptoms. Psych: Awake and oriented X 3, normal affect, Insight and Judgment appropriate.    Vicie Mutters, PA-C 7:30 AM [facility]

## 2016-06-09 ENCOUNTER — Ambulatory Visit (INDEPENDENT_AMBULATORY_CARE_PROVIDER_SITE_OTHER): Payer: Federal, State, Local not specified - PPO | Admitting: Physician Assistant

## 2016-06-09 ENCOUNTER — Encounter: Payer: Self-pay | Admitting: Physician Assistant

## 2016-06-09 ENCOUNTER — Other Ambulatory Visit: Payer: Self-pay

## 2016-06-09 VITALS — BP 128/74 | HR 76 | Temp 97.7°F | Resp 16 | Ht 69.0 in | Wt 222.8 lb

## 2016-06-09 DIAGNOSIS — Z79899 Other long term (current) drug therapy: Secondary | ICD-10-CM

## 2016-06-09 DIAGNOSIS — I10 Essential (primary) hypertension: Secondary | ICD-10-CM | POA: Diagnosis not present

## 2016-06-09 DIAGNOSIS — I2510 Atherosclerotic heart disease of native coronary artery without angina pectoris: Secondary | ICD-10-CM | POA: Diagnosis not present

## 2016-06-09 DIAGNOSIS — E785 Hyperlipidemia, unspecified: Secondary | ICD-10-CM

## 2016-06-09 DIAGNOSIS — E559 Vitamin D deficiency, unspecified: Secondary | ICD-10-CM

## 2016-06-09 DIAGNOSIS — F909 Attention-deficit hyperactivity disorder, unspecified type: Secondary | ICD-10-CM

## 2016-06-09 LAB — CBC WITH DIFFERENTIAL/PLATELET
BASOS PCT: 0 %
Basophils Absolute: 0 cells/uL (ref 0–200)
EOS ABS: 252 {cells}/uL (ref 15–500)
Eosinophils Relative: 3 %
HEMATOCRIT: 42.5 % (ref 38.5–50.0)
Hemoglobin: 14.1 g/dL (ref 13.2–17.1)
Lymphocytes Relative: 17 %
Lymphs Abs: 1428 cells/uL (ref 850–3900)
MCH: 31.1 pg (ref 27.0–33.0)
MCHC: 33.2 g/dL (ref 32.0–36.0)
MCV: 93.6 fL (ref 80.0–100.0)
MONO ABS: 756 {cells}/uL (ref 200–950)
MPV: 10 fL (ref 7.5–12.5)
Monocytes Relative: 9 %
NEUTROS ABS: 5964 {cells}/uL (ref 1500–7800)
Neutrophils Relative %: 71 %
PLATELETS: 277 10*3/uL (ref 140–400)
RBC: 4.54 MIL/uL (ref 4.20–5.80)
RDW: 14.2 % (ref 11.0–15.0)
WBC: 8.4 10*3/uL (ref 3.8–10.8)

## 2016-06-09 LAB — TSH: TSH: 1.01 mIU/L (ref 0.40–4.50)

## 2016-06-09 MED ORDER — AMPHETAMINE-DEXTROAMPHETAMINE 20 MG PO TABS: 20.0000 mg | ORAL_TABLET | Freq: Two times a day (BID) | ORAL | 0 refills | Status: DC

## 2016-06-09 MED ORDER — METOPROLOL TARTRATE 50 MG PO TABS: 50.0000 mg | ORAL_TABLET | Freq: Two times a day (BID) | ORAL | 0 refills | Status: DC

## 2016-06-09 MED ORDER — ATORVASTATIN CALCIUM 80 MG PO TABS: 80.0000 mg | ORAL_TABLET | Freq: Every day | ORAL | 0 refills | Status: DC

## 2016-06-09 NOTE — Patient Instructions (Signed)
If you have a smart phone, please look up Smoke Free app, this will help you stay on track and give you information about money you have saved, life that you have gained back and a ton of more information.   We are giving you chantix for smoking cessation. You can do it! And we are here to help! You may have heard some scary side effects about chantix, the three most common I hear about are nausea, crazy dreams and depression.  However, I like for my patients to try to stay on 1/2 a tablet twice a day rather than one tablet twice a day as normally prescribed. This helps decrease the chances of side effects and helps save money by making a one month prescription last two months  Please start the prescription this way:  Start 1/2 tablet by mouth once daily after food with a full glass of water for 3 days Then do 1/2 tablet by mouth twice daily for 4 days. During this first week you can smoke, but try to stop after this week.  At this point we have several options: 1) continue on 1/2 tablet twice a day- which I encourage you to do. You can stay on this dose the rest of the time on the medication or if you still feel the need to smoke you can do one of the two options below. 2) do one tablet in the morning and 1/2 in the evening which helps decrease dreams. 3) do one tablet twice a day.   What if I miss a dose? If you miss a dose, take it as soon as you can. If it is almost time for your next dose, take only that dose. Do not take double or extra doses.  What should I watch for while using this medicine? Visit your doctor or health care professional for regular check ups. Ask for ongoing advice and encouragement from your doctor or healthcare professional, friends, and family to help you quit. If you smoke while on this medication, quit again  Your mouth may get dry. Chewing sugarless gum or hard candy, and drinking plenty of water may help. Contact your doctor if the problem does not go away or is  severe.  You may get drowsy or dizzy. Do not drive, use machinery, or do anything that needs mental alertness until you know how this medicine affects you. Do not stand or sit up quickly, especially if you are an older patient.   The use of this medicine may increase the chance of suicidal thoughts or actions. Pay special attention to how you are responding while on this medicine. Any worsening of mood, or thoughts of suicide or dying should be reported to your health care professional right away.  ADVANTAGES OF QUITTING SMOKING  Within 20 minutes, blood pressure decreases. Your pulse is at normal level.  After 8 hours, carbon monoxide levels in the blood return to normal. Your oxygen level increases.  After 24 hours, the chance of having a heart attack starts to decrease. Your breath, hair, and body stop smelling like smoke.  After 48 hours, damaged nerve endings begin to recover. Your sense of taste and smell improve.  After 72 hours, the body is virtually free of nicotine. Your bronchial tubes relax and breathing becomes easier.  After 2 to 12 weeks, lungs can hold more air. Exercise becomes easier and circulation improves.  After 1 year, the risk of coronary heart disease is cut in half.  After 5 years,  the risk of stroke falls to the same as a nonsmoker.  After 10 years, the risk of lung cancer is cut in half and the risk of other cancers decreases significantly.  After 15 years, the risk of coronary heart disease drops, usually to the level of a nonsmoker.  You will have extra money to spend on things other than cigarettes.   Smoking Hazards Smoking cigarettes is extremely bad for your health. Tobacco smoke has over 200 known poisons in it. It contains the poisonous gases nitrogen oxide and carbon monoxide. There are over 60 chemicals in tobacco smoke that cause cancer. Some of the chemicals found in cigarette smoke include:   Cyanide.   Benzene.   Formaldehyde.    Methanol (wood alcohol).   Acetylene (fuel used in welding torches).   Ammonia.  Even smoking lightly shortens your life expectancy by several years. You can greatly reduce the risk of medical problems for you and your family by stopping now. Smoking is the most preventable cause of death and disease in our society. Within days of quitting smoking, your circulation improves, you decrease the risk of having a heart attack, and your lung capacity improves. There may be some increased phlegm in the first few days after quitting, and it may take months for your lungs to clear up completely. Quitting for 10 years reduces your risk of developing lung cancer to almost that of a nonsmoker.  WHAT ARE THE RISKS OF SMOKING? Cigarette smokers have an increased risk of many serious medical problems, including:  Lung cancer.   Lung disease (such as pneumonia, bronchitis, and emphysema).   Heart attack and chest pain due to the heart not getting enough oxygen (angina).   Heart disease and peripheral blood vessel disease.   Hypertension.   Stroke.   Oral cancer (cancer of the lip, mouth, or voice box).   Bladder cancer.   Pancreatic cancer.   Cervical cancer.   Pregnancy complications, including premature birth.   Stillbirths and smaller newborn babies, birth defects, and genetic damage to sperm.   Early menopause.   Lower estrogen level for women.   Infertility.   Facial wrinkles.   Blindness.   Increased risk of broken bones (fractures).   Senile dementia.   Stomach ulcers and internal bleeding.   Delayed wound healing and increased risk of complications during surgery. Because of secondhand smoke exposure, children of smokers have an increased risk of the following:   Sudden infant death syndrome (SIDS).   Respiratory infections.   Lung cancer.   Heart disease.   Ear infections.  WHY IS SMOKING ADDICTIVE? Nicotine is the chemical agent  in tobacco that is capable of causing addiction or dependence. When you smoke and inhale, nicotine is absorbed rapidly into the bloodstream through your lungs. Both inhaled and noninhaled nicotine may be addictive.  WHAT ARE THE BENEFITS OF QUITTING?  There are many health benefits to quitting smoking. Some are:   The likelihood of developing cancer and heart disease decreases. Health improvements are seen almost immediately.   Blood pressure, pulse rate, and breathing patterns start returning to normal soon after quitting.   People who quit may see an improvement in their overall quality of life.  HOW DO YOU QUIT SMOKING? Smoking is an addiction with both physical and psychological effects, and longtime habits can be hard to change. Your health care provider can recommend:  Programs and community resources, which may include group support, education, or therapy.  Replacement products, such  as patches, gum, and nasal sprays. Use these products only as directed. Do not replace cigarette smoking with electronic cigarettes (commonly called e-cigarettes). The safety of e-cigarettes is unknown, and some may contain harmful chemicals. FOR MORE INFORMATION  American Lung Association: www.lung.org  American Cancer Society: www.cancer.org This information is not intended to replace advice given to you by your health care provider. Make sure you discuss any questions you have with your health care provider. Document Released: 06/12/2004 Document Revised: 08/27/2015 Document Reviewed: 10/25/2012 Elsevier Interactive Patient Education  2017 [employer].

## 2016-06-09 NOTE — Progress Notes (Signed)
Assessment and Plan:  Hypertension -Continue medication, monitor blood pressure at home. Continue DASH diet.  Reminder to go to the ER if any CP, SOB, nausea, dizziness, severe HA, changes vision/speech, left arm numbness and tingling and jaw pain.  Cholesterol -Continue diet and exercise. Check cholesterol.   Smoking cessation-   instruction/counseling given, counseled patient on the dangers of tobacco use, advised patient to stop smoking, and reviewed strategies to maximize success, patient not ready to quit at this time.   Vitamin D Def - check level and continue medications.   ADD Printed out, taking appropriately  Continue diet and meds as discussed. Further disposition pending results of labs. Over 30 minutes of exam, counseling, chart review, and critical decision making was performed Future Appointments Date Time Provider [place]  06/09/2016 9:30 AM Vicie Mutters, PA-C [id number] None  10/01/2016 9:00 AM Vicie Mutters, PA-C [facility] None    HPI 51 y.o. male  presents for 3 month follow up on hypertension, cholesterol, prediabetes, and vitamin D deficiency.   His blood pressure has been controlled at home, today their BP is BP: 128/74  He has ASHD history with stent in 2010, on bASA, continue to smoke.  Had left partial nephrectomy 05/7492, no complications since that time.   He does not workout. He denies chest pain, shortness of breath, dizziness. He takes adderall just during the week, 1-2 x a day, and only 3-4 days a week.   He is on cholesterol medication and denies myalgias. His cholesterol is at goal. The cholesterol last visit was:   Lab Results  Component Value Date   CHOL 107 (L) 01/14/2016   HDL 31 (L) 01/14/2016   LDLCALC 52 01/14/2016   TRIG 120 01/14/2016   CHOLHDL 3.5 01/14/2016    He has been working on diet and exercise for prediabetes, and denies paresthesia of the feet, polydipsia, polyuria and visual disturbances. Last A1C in the office  was:  Lab Results  Component Value Date   HGBA1C 5.6 10/01/2015   Patient is on Vitamin D supplement.   Lab Results  Component Value Date   VD25OH 49 01/14/2016     Still following Dr. Nelva Bush for his right ulnar neuropathy, has had nerve damage.  BMI is Body mass index is 32.9 kg/m., he is working on diet and exercise. Wt Readings from Last 3 Encounters:  06/09/16 222 lb 12.8 oz (101.1 kg)  01/14/16 217 lb 3.2 oz (98.5 kg)  12/31/15 219 lb 6.4 oz (99.5 kg)    Current Medications:  Current Outpatient Prescriptions on File Prior to Visit  Medication Sig Dispense Refill  . amphetamine-dextroamphetamine (ADDERALL) 20 MG tablet Take 1 tablet (20 mg total) by mouth 2 (two) times daily. 60 tablet 0  . cetirizine (ZYRTEC) 10 MG tablet Take 10 mg by mouth daily as needed for allergies.     Marland Kitchen lidocaine (LIDODERM) 5 % Place 1 patch onto the skin daily. Remove & Discard patch within 12 hours or as directed by MD 30 patch 3   No current facility-administered medications on file prior to visit.    Medical History:  Past Medical History:  Diagnosis Date  . ADD (attention deficit disorder)   . Appendicitis   . ASHD (arteriosclerotic heart disease)   . Colitis   . Crohn's disease ([place])   . Eczema   . GERD (gastroesophageal reflux disease)   . Hyperlipidemia   . Hypertension   . Left renal mass   . Morbid obesity ([place])   .  Myocardial infarction    status post RCA stenting in [facility] 2011  . Positive for macroalbuminuria    Allergies: No Known Allergies   Review of Systems:  Review of Systems  Constitutional: Negative for chills, diaphoresis, fever and malaise/fatigue.  HENT: Negative for congestion and sore throat.   Eyes: Negative.   Respiratory: Negative for cough, hemoptysis, sputum production, shortness of breath and wheezing.   Cardiovascular: Negative.  Negative for chest pain and leg swelling.  Gastrointestinal: Negative.   Genitourinary: Negative.   Musculoskeletal:  Negative for neck pain.  Neurological: Negative.   Psychiatric/Behavioral: Negative.     Family history- Review and unchanged Social history- Review and unchanged Physical Exam: BP 128/74   Pulse 76   Temp 97.7 F (36.5 C)   Resp 16   Ht 5' 9"  (1.753 m)   Wt 222 lb 12.8 oz (101.1 kg)   SpO2 98%   BMI 32.90 kg/m  Wt Readings from Last 3 Encounters:  06/09/16 222 lb 12.8 oz (101.1 kg)  01/14/16 217 lb 3.2 oz (98.5 kg)  12/31/15 219 lb 6.4 oz (99.5 kg)   General Appearance: Well nourished, in no apparent distress. Eyes: PERRLA, EOMs, conjunctiva no swelling or erythema Sinuses: + Frontal/maxillary tenderness ENT/Mouth: Ext aud canals clear, TMs without erythema, bulging. No erythema, swelling, or exudate on post pharynx.  Tonsils not swollen or erythematous. Hearing normal.  Neck: Supple, thyroid normal.  Respiratory: Respiratory effort normal, BS equal bilaterally with wheezing without rales, rhonchi, or stridor.  Cardio: RRR with no MRGs. Brisk peripheral pulses without edema.  Abdomen: Soft, + BS,  Non tender, no guarding, rebound, hernias, masses. Lymphatics: Non tender without lymphadenopathy.  Musculoskeletal: Full ROM, 5/5 strength, Normal gait, right hand in soft cast Skin: Warm, dry without rashes, lesions, ecchymosis.  Neuro: Cranial nerves intact. Normal muscle tone, no cerebellar symptoms. Psych: Awake and oriented X 3, normal affect, Insight and Judgment appropriate.    Vicie Mutters, PA-C 9:10 AM [facility]

## 2016-06-10 LAB — HEPATIC FUNCTION PANEL
ALBUMIN: 3.9 g/dL (ref 3.6–5.1)
ALT: 17 U/L (ref 9–46)
AST: 17 U/L (ref 10–35)
Alkaline Phosphatase: 94 U/L (ref 40–115)
BILIRUBIN INDIRECT: 0.2 mg/dL (ref 0.2–1.2)
BILIRUBIN TOTAL: 0.3 mg/dL (ref 0.2–1.2)
Bilirubin, Direct: 0.1 mg/dL (ref <=0.2)
Total Protein: 6.6 g/dL (ref 6.1–8.1)

## 2016-06-10 LAB — BASIC METABOLIC PANEL WITH GFR
BUN: 13 mg/dL (ref 7–25)
CHLORIDE: 109 mmol/L (ref 98–110)
CO2: 23 mmol/L (ref 20–31)
Calcium: 9.2 mg/dL (ref 8.6–10.3)
Creat: 0.94 mg/dL (ref 0.70–1.33)
GFR, Est African American: >89 mL/min (ref >=60)
GLUCOSE: 78 mg/dL (ref 65–99)
POTASSIUM: 4.6 mmol/L (ref 3.5–5.3)
Sodium: 140 mmol/L (ref 135–146)

## 2016-06-10 LAB — LIPID PANEL
Cholesterol: 120 mg/dL (ref <200)
HDL: 28 mg/dL — AB (ref >40)
LDL Cholesterol: 67 mg/dL (ref <100)
TRIGLYCERIDES: 127 mg/dL (ref <150)
Total CHOL/HDL Ratio: 4.3 Ratio (ref <5.0)
VLDL: 25 mg/dL (ref <30)

## 2016-06-10 LAB — MAGNESIUM: MAGNESIUM: 1.9 mg/dL (ref 1.5–2.5)

## 2016-06-10 LAB — VITAMIN D 25 HYDROXY (VIT D DEFICIENCY, FRACTURES): Vit D, 25-Hydroxy: 36 ng/mL (ref 30–100)

## 2016-09-30 NOTE — Progress Notes (Signed)
Complete physical  Assessment and Plan:   Essential hypertension - continue medications, DASH diet, exercise and monitor at home. Call if greater than 130/80.  - will cut metoprolol in half, may need to add medication - CBC with Differential/Platelet - BASIC METABOLIC PANEL WITH GFR - Hepatic function panel - TSH - DG Chest 2 View; Future   ASHD (arteriosclerotic heart disease) Control blood pressure, cholesterol, glucose, increase exercise.  Stop smoking   Hyperlipidemia -continue medications, check lipids, decrease fatty foods, increase activity.  - Lipid panel   Crohn's disease of ileum, unspecified complication ([place]) Continue GI follow up   Vitamin D deficiency - VITAMIN D 25 Hydroxy (Vit-D Deficiency, Fractures)   Medication management - Magnesium   Left renal mass A/p nephrectomy, follow up urology   ADD (attention deficit disorder) - amphetamine-dextroamphetamine (ADDERALL) 20 MG tablet; Take 1 tablet (20 mg total) by mouth 2 (two) times daily.  Dispense: 60 tablet; Refill: 0   Anemia, unspecified anemia type - Iron and TIBC - Ferritin - Vitamin B12  Encounter for general adult medical examination with abnormal findings  Smoker Advised to quit smoking, declines at this time -     DG Chest 2 View; Future -     fluticasone furoate-vilanterol (BREO ELLIPTA) 100-25 MCG/INH AEPB; Inhale 1 puff into the lungs daily. Rinse mouth with water after each use  Screening PSA (prostate specific antigen) -     PSA  Hypogonadism [name] -     Testosterone  Abscess -     sulfamethoxazole-trimethoprim (BACTRIM DS,SEPTRA DS) 800-160 MG tablet; Take 1 tablet by mouth 2 (two) times daily. - warm compresses, will monitor, come back in if worse  Sinus bradycardia -     metoprolol tartrate (LOPRESSOR) 25 MG tablet; Take 1 tablet (25 mg total) by mouth 2 (two) times daily. - monitor HTN/HR at home   Continue diet and meds as discussed. Further disposition pending results of  labs.  HPI Cole [name]  presents for CPE and  3 month follow up with hypertension, hyperlipidemia, prediabetes, CAD s/p stent  and vitamin D.  Has had abscess/ingrown hair on right buttocks x 2 weeks, has not done anything to it other than put clobetasol on it.    His blood pressure has been controlled at home, today their BP is BP: 126/84  He does not workout but will shovel mulch. He denies chest pain, shortness of breath, dizziness.  He has a history of ASHD S/P Stenting in 2010, states that the time he did not have insurance and was only on plavix x 6 months after, has continued to smoke,, no longer does drugs. He is on ASA.  Denies dyspnea, exertional chest pressure/discomfort and irregular heart beat.   He is on cholesterol medication and denies myalgias. His cholesterol is at goal. The cholesterol last visit was:   Lab Results  Component Value Date   CHOL 120 06/09/2016   HDL 28 (L) 06/09/2016   LDLCALC 67 06/09/2016   TRIG 127 06/09/2016   CHOLHDL 4.3 06/09/2016   He has been working on diet and exercise for prediabetes, and denies paresthesia of the feet, polydipsia, polyuria and visual disturbances. Last A1C in the office was:  Lab Results  Component Value Date   HGBA1C 5.6 10/01/2015  Patient is on Vitamin D supplement.  Recent diagnosis of Crohn's with Dr. [name], June 19th.  He also follows with Dr. Risa Grill for left renal mass found during crohn's evaluation, had left partial  nephrectomy 01/17/2015 and had + renal cell carcinoma following with urology next month.  Dr. Nelva Bush, following for right hand, had ulnar surgery that states did not help as much.  He continues to smoke but would like to quit but continue to continue to smoke. Has done patches in the past and will likely try that.  BMI is Body mass index is 30.94 kg/m., he is working on diet and exercise. Wt Readings from Last 3 Encounters:  10/01/16 215 lb 9.6 oz (97.8 kg)  06/09/16 222 lb 12.8 oz (101.1  kg)  01/14/16 217 lb 3.2 oz (98.5 kg)    Current Medications:  Current Outpatient Prescriptions on File Prior to Visit  Medication Sig Dispense Refill  . amphetamine-dextroamphetamine (ADDERALL) 20 MG tablet Take 1 tablet (20 mg total) by mouth 2 (two) times daily. 60 tablet 0  . atorvastatin (LIPITOR) 80 MG tablet Take 1 tablet (80 mg total) by mouth daily. 90 tablet 0  . cetirizine (ZYRTEC) 10 MG tablet Take 10 mg by mouth daily as needed for allergies.     Marland Kitchen lidocaine (LIDODERM) 5 % Place 1 patch onto the skin daily. Remove & Discard patch within 12 hours or as directed by MD 30 patch 3  . metoprolol (LOPRESSOR) 50 MG tablet Take 1 tablet (50 mg total) by mouth 2 (two) times daily. 180 tablet 0   No current facility-administered medications on file prior to visit.    Medical History:  Past Medical History:  Diagnosis Date  . ADD (attention deficit disorder)   . Appendicitis   . ASHD (arteriosclerotic heart disease)   . Colitis   . Crohn's disease ([place])   . Eczema   . GERD (gastroesophageal reflux disease)   . Hyperlipidemia   . Hypertension   . Left renal mass   . Morbid obesity ([place])   . Myocardial infarction [facility])    status post RCA stenting in [facility] 2011  . Positive for macroalbuminuria    Allergies No Known Allergies  SURGICAL HISTORY He  has a past surgical history that includes Appendectomy; Coronary stent placement; Colon surgery; Robotic assited partial nephrectomy (Left, 01/17/2015); and Elbow surgery. FAMILY HISTORY His family history includes Diabetes in his maternal grandmother. SOCIAL HISTORY He  reports that he has been smoking.  He has a 30.00 pack-year smoking history. He has never used smokeless tobacco. He reports that he uses drugs, including Marijuana. He reports that he does not drink alcohol.   Immunization History  Administered Date(s) Administered  . Td 05/19/2009   Colonoscopy 02/2014 Ct AB pelvis 03/17/2014 CXR 10/2013 MRI AB  08/2014 Echo 12/2014  Dr. Sabra Heck eye, 2-3 months  Review of Systems:  Review of Systems  Constitutional: Negative.   HENT: Negative.   Eyes: Negative.   Respiratory: Positive for cough. Negative for hemoptysis, sputum production, shortness of breath and wheezing.   Cardiovascular: Negative.   Gastrointestinal: Negative.   Genitourinary: Negative.   Musculoskeletal: Negative.   Skin: Positive for rash.  Neurological: Negative.   Endo/Heme/Allergies: Negative.   Psychiatric/Behavioral: Negative.    Physical Exam: BP 126/84   Pulse 87   Temp 97.3 F (36.3 C)   Resp 16   Ht 5' 10"  (1.778 m)   Wt 215 lb 9.6 oz (97.8 kg)   SpO2 98%   BMI 30.94 kg/m  Wt Readings from Last 3 Encounters:  10/01/16 215 lb 9.6 oz (97.8 kg)  06/09/16 222 lb 12.8 oz (101.1 kg)  01/14/16 217 lb 3.2 oz (  98.5 kg)   General Appearance: Well nourished, in no apparent distress. Eyes: PERRLA, EOMs, conjunctiva no swelling or erythema Sinuses: No Frontal/maxillary tenderness ENT/Mouth: Ext aud canals clear, TMs without erythema, bulging. No erythema, swelling, or exudate on post pharynx.  Tonsils not swollen or erythematous. Hearing normal.  Neck: Supple, thyroid normal.  Respiratory: Respiratory effort normal, BS equal bilaterally without rales, rhonchi, wheezing or stridor.  Cardio: RRR with no MRGs. Brisk peripheral pulses without edema.  Abdomen: Soft, + BS, obese,  Non tender, no guarding, rebound, hernias, masses. Lymphatics: Non tender without lymphadenopathy.  Musculoskeletal: Full ROM, 5/5 strength, Normal Skin: 1 cm abscess, red, tender, non fluctuant on right inguinal area, Several healing scars along AB. Warm, dry without rashes, lesions, ecchymosis.  Neuro: Cranial nerves intact. Normal muscle tone except some minor muscle wasting lateral right hand with decrease extension of 3 lateral fingers, normal distal vascular, no cerebellar symptoms. Psych: Awake and oriented X 3, normal affect,  Insight and Judgment appropriate.   EKG defer Aorta Scan defer  Vicie Mutters, PA-C 9:03 AM [facility]

## 2016-10-01 ENCOUNTER — Ambulatory Visit (INDEPENDENT_AMBULATORY_CARE_PROVIDER_SITE_OTHER): Payer: Federal, State, Local not specified - PPO | Admitting: Physician Assistant

## 2016-10-01 ENCOUNTER — Encounter: Payer: Self-pay | Admitting: Physician Assistant

## 2016-10-01 VITALS — BP 126/84 | HR 87 | Temp 97.3°F | Resp 16 | Ht 70.0 in | Wt 215.6 lb

## 2016-10-01 DIAGNOSIS — Z0001 Encounter for general adult medical examination with abnormal findings: Secondary | ICD-10-CM

## 2016-10-01 DIAGNOSIS — I2510 Atherosclerotic heart disease of native coronary artery without angina pectoris: Secondary | ICD-10-CM

## 2016-10-01 DIAGNOSIS — Z136 Encounter for screening for cardiovascular disorders: Secondary | ICD-10-CM | POA: Diagnosis not present

## 2016-10-01 DIAGNOSIS — Z79899 Other long term (current) drug therapy: Secondary | ICD-10-CM | POA: Diagnosis not present

## 2016-10-01 DIAGNOSIS — E291 Testicular hypofunction: Secondary | ICD-10-CM

## 2016-10-01 DIAGNOSIS — K50019 Crohn's disease of small intestine with unspecified complications: Secondary | ICD-10-CM

## 2016-10-01 DIAGNOSIS — R6889 Other general symptoms and signs: Secondary | ICD-10-CM

## 2016-10-01 DIAGNOSIS — F988 Other specified behavioral and emotional disorders with onset usually occurring in childhood and adolescence: Secondary | ICD-10-CM

## 2016-10-01 DIAGNOSIS — E559 Vitamin D deficiency, unspecified: Secondary | ICD-10-CM | POA: Diagnosis not present

## 2016-10-01 DIAGNOSIS — F17200 Nicotine dependence, unspecified, uncomplicated: Secondary | ICD-10-CM

## 2016-10-01 DIAGNOSIS — I10 Essential (primary) hypertension: Secondary | ICD-10-CM

## 2016-10-01 DIAGNOSIS — F909 Attention-deficit hyperactivity disorder, unspecified type: Secondary | ICD-10-CM

## 2016-10-01 DIAGNOSIS — N2889 Other specified disorders of kidney and ureter: Secondary | ICD-10-CM

## 2016-10-01 DIAGNOSIS — E785 Hyperlipidemia, unspecified: Secondary | ICD-10-CM

## 2016-10-01 DIAGNOSIS — Z125 Encounter for screening for malignant neoplasm of prostate: Secondary | ICD-10-CM

## 2016-10-01 DIAGNOSIS — L0291 Cutaneous abscess, unspecified: Secondary | ICD-10-CM

## 2016-10-01 DIAGNOSIS — R001 Bradycardia, unspecified: Secondary | ICD-10-CM

## 2016-10-01 DIAGNOSIS — D649 Anemia, unspecified: Secondary | ICD-10-CM

## 2016-10-01 LAB — HEPATIC FUNCTION PANEL
ALBUMIN: 4.2 g/dL (ref 3.6–5.1)
ALT: 30 U/L (ref 9–46)
AST: 22 U/L (ref 10–35)
Alkaline Phosphatase: 114 U/L (ref 40–115)
BILIRUBIN TOTAL: 0.4 mg/dL (ref 0.2–1.2)
Bilirubin, Direct: 0.1 mg/dL (ref <=0.2)
Indirect Bilirubin: 0.3 mg/dL (ref 0.2–1.2)
TOTAL PROTEIN: 7.3 g/dL (ref 6.1–8.1)

## 2016-10-01 LAB — BASIC METABOLIC PANEL WITH GFR
BUN: 14 mg/dL (ref 7–25)
CO2: 22 mmol/L (ref 20–31)
Calcium: 9.3 mg/dL (ref 8.6–10.3)
Chloride: 105 mmol/L (ref 98–110)
Creat: 0.9 mg/dL (ref 0.70–1.33)
GFR, Est African American: >89 mL/min (ref >=60)
GFR, Est Non African American: >89 mL/min (ref >=60)
Glucose, Bld: 82 mg/dL (ref 65–99)
POTASSIUM: 4.6 mmol/L (ref 3.5–5.3)
Sodium: 137 mmol/L (ref 135–146)

## 2016-10-01 LAB — CBC WITH DIFFERENTIAL/PLATELET
BASOS ABS: 0 {cells}/uL (ref 0–200)
Basophils Relative: 0 %
Eosinophils Absolute: 188 cells/uL (ref 15–500)
Eosinophils Relative: 2 %
HEMATOCRIT: 43 % (ref 38.5–50.0)
Hemoglobin: 14.5 g/dL (ref 13.2–17.1)
LYMPHS PCT: 17 %
Lymphs Abs: 1598 cells/uL (ref 850–3900)
MCH: 31.5 pg (ref 27.0–33.0)
MCHC: 33.7 g/dL (ref 32.0–36.0)
MCV: 93.5 fL (ref 80.0–100.0)
MONO ABS: 564 {cells}/uL (ref 200–950)
MONOS PCT: 6 %
MPV: 10.3 fL (ref 7.5–12.5)
Neutro Abs: 7050 cells/uL (ref 1500–7800)
Neutrophils Relative %: 75 %
PLATELETS: 287 10*3/uL (ref 140–400)
RBC: 4.6 MIL/uL (ref 4.20–5.80)
RDW: 14.2 % (ref 11.0–15.0)
WBC: 9.4 10*3/uL (ref 3.8–10.8)

## 2016-10-01 LAB — IRON AND TIBC
%SAT: 18 % (ref 15–60)
Iron: 68 ug/dL (ref 50–180)
TIBC: 368 ug/dL (ref 250–425)
UIBC: 300 ug/dL

## 2016-10-01 LAB — LIPID PANEL
CHOLESTEROL: 131 mg/dL (ref <200)
HDL: 35 mg/dL — ABNORMAL LOW (ref >40)
LDL Cholesterol: 73 mg/dL (ref <100)
TRIGLYCERIDES: 113 mg/dL (ref <150)
Total CHOL/HDL Ratio: 3.7 Ratio (ref <5.0)
VLDL: 23 mg/dL (ref <30)

## 2016-10-01 LAB — TSH: TSH: 1.66 m[IU]/L (ref 0.40–4.50)

## 2016-10-01 LAB — MAGNESIUM: Magnesium: 1.9 mg/dL (ref 1.5–2.5)

## 2016-10-01 MED ORDER — FLUTICASONE FUROATE-VILANTEROL 100-25 MCG/INH IN AEPB: 1.0000 | INHALATION_SPRAY | Freq: Every day | RESPIRATORY_TRACT | 2 refills | Status: DC

## 2016-10-01 MED ORDER — METOPROLOL TARTRATE 25 MG PO TABS: 25.0000 mg | ORAL_TABLET | Freq: Two times a day (BID) | ORAL | 1 refills | Status: DC

## 2016-10-01 MED ORDER — AMPHETAMINE-DEXTROAMPHETAMINE 20 MG PO TABS: 20.0000 mg | ORAL_TABLET | Freq: Two times a day (BID) | ORAL | 0 refills | Status: DC

## 2016-10-01 MED ORDER — SULFAMETHOXAZOLE-TRIMETHOPRIM 800-160 MG PO TABS: 1.0000 | ORAL_TABLET | Freq: Two times a day (BID) | ORAL | 0 refills | Status: DC

## 2016-10-01 NOTE — Patient Instructions (Addendum)
[place]ron wrist cuff  Monitor your blood pressure at home. Go to the ER if any CP, SOB, nausea, dizziness, severe HA, changes vision/speech  Goal BP:  For patients younger than 60: Goal BP < 140/90. For patients 60 and older: Goal BP < 150/90. For patients with diabetes: Goal BP < 140/90. Your most recent BP: BP: 126/84   Take your medications faithfully as instructed. Maintain a healthy weight. Get at least 150 minutes of aerobic exercise per week. Minimize salt intake. Minimize alcohol intake  DASH Eating Plan DASH stands for "Dietary Approaches to Stop Hypertension." The DASH eating plan is a healthy eating plan that has been shown to reduce high blood pressure (hypertension). Additional health benefits may include reducing the risk of type 2 diabetes mellitus, heart disease, and stroke. The DASH eating plan may also help with weight loss. WHAT DO I NEED TO KNOW ABOUT THE DASH EATING PLAN? For the DASH eating plan, you will follow these general guidelines:  Choose foods with a percent daily value for sodium of less than 5% (as listed on the food label).  Use salt-free seasonings or herbs instead of table salt or sea salt.  Check with your health care provider or pharmacist before using salt substitutes.  Eat lower-sodium products, often labeled as "lower sodium" or "no salt added."  Eat fresh foods.  Eat more vegetables, fruits, and low-fat dairy products.  Choose whole grains. Look for the word "whole" as the first word in the ingredient list.  Choose fish and skinless chicken or [place] more often than red meat. Limit fish, poultry, and meat to 6 oz (170 g) each day.  Limit sweets, desserts, sugars, and sugary drinks.  Choose heart-healthy fats.  Limit cheese to 1 oz (28 g) per day.  Eat more home-cooked food and less restaurant, buffet, and fast food.  Limit fried foods.  Cook foods using methods other than frying.  Limit canned vegetables. If you do use them, rinse  them well to decrease the sodium.  When eating at a restaurant, ask that your food be prepared with less salt, or no salt if possible. WHAT FOODS CAN I EAT? Seek help from a dietitian for individual calorie needs. Grains Whole grain or whole wheat bread. Brown rice. Whole grain or whole wheat pasta. Quinoa, bulgur, and whole grain cereals. Low-sodium cereals. Corn or whole wheat flour tortillas. Whole grain cornbread. Whole grain crackers. Low-sodium crackers. Vegetables Fresh or frozen vegetables (raw, steamed, roasted, or grilled). Low-sodium or reduced-sodium tomato and vegetable juices. Low-sodium or reduced-sodium tomato sauce and paste. Low-sodium or reduced-sodium canned vegetables.  Fruits All fresh, canned (in natural juice), or frozen fruits. Meat and Other Protein Products Ground beef (85% or leaner), grass-fed beef, or beef trimmed of fat. Skinless chicken or [place]. Ground chicken or [place]. Pork trimmed of fat. All fish and seafood. Eggs. Dried beans, peas, or lentils. Unsalted nuts and seeds. Unsalted canned beans. Dairy Low-fat dairy products, such as skim or 1% milk, 2% or reduced-fat cheeses, low-fat ricotta or cottage cheese, or plain low-fat yogurt. Low-sodium or reduced-sodium cheeses. Fats and Oils Tub margarines without trans fats. Light or reduced-fat mayonnaise and salad dressings (reduced sodium). Avocado. Safflower, olive, or canola oils. Natural peanut or almond butter. Other Unsalted popcorn and pretzels. The items listed above may not be a complete list of recommended foods or beverages. Contact your dietitian for more options. WHAT FOODS ARE NOT RECOMMENDED? Grains White bread. White pasta. White rice. Refined cornbread. Bagels and croissants.  Crackers that contain trans fat. Vegetables Creamed or fried vegetables. Vegetables in a cheese sauce. Regular canned vegetables. Regular canned tomato sauce and paste. Regular tomato and vegetable juices. Fruits Dried  fruits. Canned fruit in light or heavy syrup. Fruit juice. Meat and Other Protein Products Fatty cuts of meat. Ribs, chicken wings, bacon, sausage, bologna, salami, chitterlings, fatback, hot dogs, bratwurst, and packaged luncheon meats. Salted nuts and seeds. Canned beans with salt. Dairy Whole or 2% milk, cream, half-and-half, and cream cheese. Whole-fat or sweetened yogurt. Full-fat cheeses or blue cheese. Nondairy creamers and whipped toppings. Processed cheese, cheese spreads, or cheese curds. Condiments Onion and garlic salt, seasoned salt, table salt, and sea salt. Canned and packaged gravies. Worcestershire sauce. Tartar sauce. Barbecue sauce. Teriyaki sauce. Soy sauce, including reduced sodium. Steak sauce. Fish sauce. Oyster sauce. Cocktail sauce. Horseradish. Ketchup and mustard. Meat flavorings and tenderizers. Bouillon cubes. Hot sauce. Tabasco sauce. Marinades. Taco seasonings. Relishes. Fats and Oils Butter, stick margarine, lard, shortening, ghee, and bacon fat. Coconut, palm kernel, or palm oils. Regular salad dressings. Other Pickles and olives. Salted popcorn and pretzels. The items listed above may not be a complete list of foods and beverages to avoid. Contact your dietitian for more information. WHERE CAN I FIND MORE INFORMATION? National Heart, Lung, and Blood Institute: travelstabloid.com Document Released: 04/24/2011 Document Revised: 09/19/2013 Document Reviewed: 03/09/2013 [facility] Patient Information 2015 [place], [place]. This information is not intended to replace advice given to you by your health care provider. Make sure you discuss any questions you have with your health care provider.   Bradycardia, Adult Bradycardia is a slower-than-normal heartbeat. A normal resting heart rate for an adult ranges from 60 to 100 beats per minute. With bradycardia, the resting heart rate is less than 60 beats per minute. Bradycardia can prevent  enough oxygen from reaching certain areas of your body when you are active. It can be serious if it keeps enough oxygen from reaching your brain and other parts of your body. Bradycardia is not a problem for everyone. For some healthy adults, a slow resting heart rate is normal. What are the causes? This condition may be caused by:  A problem with the heart, including:  A problem with the heart's electrical system, such as a heart block.  A problem with the heart's natural pacemaker (sinus node).  Heart disease.  A heart attack.  Heart damage.  A heart infection.  A heart condition that is present at birth (congenital heart defect).  Certain medicines that treat heart conditions.  Certain conditions, such as hypothyroidism and obstructive sleep apnea.  Problems with the balance of chemicals and other substances, like potassium, in the blood. What increases the risk? This condition is more likely to develop in adults who:  Are age 26 or older.  Have high blood pressure (hypertension), high cholesterol (hyperlipidemia), or diabetes.  Drink heavily, use tobacco or nicotine products, or use drugs.  Are stressed. What are the signs or symptoms? Symptoms of this condition include:  Light-headedness.  Feeling faint or fainting.  Fatigue and weakness.  Shortness of breath.  Chest pain (angina).  Drowsiness.  Confusion.  Dizziness. How is this diagnosed? This condition may be diagnosed based on:  Your symptoms.  Your medical history.  A physical exam. During the exam, your health care provider will listen to your heartbeat and check your pulse. To confirm the diagnosis, your health care provider may order tests, such as:  Blood tests.  An electrocardiogram (ECG). This test  records the heart's electrical activity. The test can show how fast your heart is beating and whether the heartbeat is steady.  A test in which you wear a portable device (event recorder or  Holter monitor) to record your heart's electrical activity while you go about your day.  Anexercise test. How is this treated? Treatment for this condition depends on the cause of the condition and how severe your symptoms are. Treatment may involve:  Treatment of the underlying condition.  Changing your medicines or how much medicine you take.  Having a small, battery-operated device called a pacemaker implanted under the skin. When bradycardia occurs, this device can be used to increase your heart rate and help your heart to beat in a regular rhythm. Follow these instructions at home: Lifestyle    Manage any health conditions that contribute to bradycardia as told by your health care provider.  Follow a heart-healthy diet. A nutrition specialist (dietitian) can help to educate you about healthy food options and changes.  Follow an exercise program that is approved by your health care provider.  Maintain a healthy weight.  Try to reduce or manage your stress, such as with yoga or meditation. If you need help reducing stress, ask your health care provider.  Do not use use any products that contain nicotine or tobacco, such as cigarettes and e-cigarettes. If you need help quitting, ask your health care provider.  Do not use illegal drugs.  Limit alcohol intake to no more than 1 drink per day for nonpregnant women and 2 drinks per day for men. One drink equals 12 oz of beer, 5 oz of wine, or 1 oz of hard liquor. General instructions   Take over-the-counter and prescription medicines only as told by your health care provider.  Keep all follow-up visits as directed by your health care provider. This is important. How is this prevented? In some cases, bradycardia may be prevented by:  Treating underlying medical problems.  Stopping behaviors or medicines that can trigger the condition. Contact a health care provider if:  You feel light-headed or dizzy.  You almost  faint.  You feel weak or are easily fatigued during physical activity.  You experience confusion or have memory problems. Get help right away if:  You faint.  You have an irregular heartbeat (palpitations).  You have chest pain.  You have trouble breathing. This information is not intended to replace advice given to you by your health care provider. Make sure you discuss any questions you have with your health care provider. Document Released: 01/25/2002 Document Revised: 01/01/2016 Document Reviewed: 10/25/2015 Elsevier Interactive Patient Education  2017 [place].   Perianal Abscess An abscess is an infected area that is filled with pus. A perianal abscess occurs in the perineum, which is the area between the anus and the scrotum in males and between the anus and the vagina in females. Perianal abscesses can vary in size. Without treatment, a perianal abscess can become larger and cause other problems. What are the causes? This condition is caused by:  Waste from damaged or dead tissue (debris) that plugs up glands in the perineum. When this happens, an abscess may form.  Infections of the perineum. What are the signs or symptoms? Common symptoms of this condition include:  Swelling and redness in the area of the abscess. The redness may go beyond the abscess and appear as a red streak on the skin.  Pain in the area of the abscess, including pain when sitting,  walking, or passing stool. Other possible symptoms include:  A visible, painful lump, or a lump that can be felt when touched.  Bleeding or pus-like discharge from the area.  Fever.  General weakness. How is this diagnosed? This condition is diagnosed based on your medical history and a physical exam of the affected area.  This may involve examining the rectal area with a gloved hand (digital rectal exam).  Sometimes, the health care provider needs to look into the rectum using a probe or a scope.  For  women, it may require a careful vaginal exam. How is this treated? Treatment for this condition may include:  Making a cut (incision) in the abscess to drain the pus. This can sometimes be done in your health care provider's office or an emergency department after you are given medicine to numb the area (local anesthetic).  Surgery to drain the abscess. This is for larger or deeper abscesses.  Antibiotic medicines, if there is infection in the surrounding tissue (cellulitis).  Having gauze packed into the abscess to continue draining the area.  Frequent baths in warm water that is deep enough to cover your hips and buttocks (sitz baths). These help the wound heal and they make the abscess less likely to come back. Follow these instructions at home: Medicines   Take over-the-counter and prescription medicines for pain, fever, or discomfort only as told by your health care provider.  If you were prescribed an antibiotic medicine, use it as told by your health care provider. Do not stop using the antibiotic even if you start to feel better.  Do not drive or use heavy machinery while taking prescription pain medicine. Wound care    Keep the skin around the wound clean and dry. Avoid cleaning the area too much.  Avoid scratching the wound.  Avoid using colored or perfumed toilet papers.  Take a sitz bath 3-4 times a day and after bowel movements. This will help reduce pain and swelling.  If directed, apply ice to the injured area:  Put ice in a plastic bag.  Place a towel between your skin and the bag.  Leave the ice on for 20 minutes, 2-3 times a day.  Check your incision area every day for signs of infection. Check for:  More redness, swelling, or pain.  More fluid or blood.  Warmth.  Pus or a bad smell. Gauze   If gauze was used in the abscess, follow instructions from your health care provider about removing or changing the gauze. It can usually be removed in 2-3  days.  Wash your hands with soap and water before you remove or change your gauze. If soap and water are not available, use hand sanitizer.  If one or more drains were placed in the abscess cavity, be careful not to pull at them. Your health care provider will tell you how long they need to remain in place. General instructions   Keep all follow-up visits as told by your health care provider. This is important. Contact a health care provider if:  You have trouble passing stool or passing urine.  Your pain or swelling in the affected area does not seem to be getting better.  The gauze packing or the drains come out before the planned time. Get help right away if:  You have problems moving or using your legs.  You have severe or increasing pain.  Your swelling in the affected area suddenly gets worse.  You have a large increase  in bleeding or passing of pus.  You have chills or a fever. This information is not intended to replace advice given to you by your health care provider. Make sure you discuss any questions you have with your health care provider. Document Released: 06/11/2006 Document Revised: 11/23/2015 Document Reviewed: 10/15/2015 Elsevier Interactive Patient Education  2017 [employer].

## 2016-10-02 LAB — PSA: PSA: 0.6 ng/mL (ref <=4.0)

## 2016-10-02 LAB — TESTOSTERONE: TESTOSTERONE: 496 ng/dL (ref 250–827)

## 2016-10-02 LAB — URINALYSIS, ROUTINE W REFLEX MICROSCOPIC
BILIRUBIN URINE: NEGATIVE
Glucose, UA: NEGATIVE
HGB URINE DIPSTICK: NEGATIVE
Ketones, ur: NEGATIVE
LEUKOCYTES UA: NEGATIVE
NITRITE: NEGATIVE
PROTEIN: NEGATIVE
SPECIFIC GRAVITY, URINE: 1.009 (ref 1.001–1.035)
pH: 6 (ref 5.0–8.0)

## 2016-10-02 LAB — MICROALBUMIN / CREATININE URINE RATIO
Creatinine, Urine: 54 mg/dL (ref 20–370)
Microalb Creat Ratio: 52 mcg/mg creat — ABNORMAL HIGH (ref <30)
Microalb, Ur: 2.8 mg/dL

## 2016-10-02 LAB — VITAMIN D 25 HYDROXY (VIT D DEFICIENCY, FRACTURES): Vit D, 25-Hydroxy: 39 ng/mL (ref 30–100)

## 2016-10-02 LAB — VITAMIN B12: VITAMIN B 12: 1021 pg/mL (ref 200–1100)

## 2016-12-19 DIAGNOSIS — R1084 Generalized abdominal pain: Secondary | ICD-10-CM | POA: Diagnosis not present

## 2016-12-19 DIAGNOSIS — N201 Calculus of ureter: Secondary | ICD-10-CM | POA: Diagnosis not present

## 2016-12-19 DIAGNOSIS — R311 Benign essential microscopic hematuria: Secondary | ICD-10-CM | POA: Diagnosis not present

## 2016-12-19 DIAGNOSIS — N132 Hydronephrosis with renal and ureteral calculous obstruction: Secondary | ICD-10-CM | POA: Diagnosis not present

## 2017-02-02 ENCOUNTER — Ambulatory Visit: Payer: Self-pay | Admitting: Physician Assistant

## 2017-02-04 NOTE — Progress Notes (Signed)
Assessment and Plan:  Hypertension -Continue medication, monitor blood pressure at home. Continue DASH diet.  Reminder to go to the ER if any CP, SOB, nausea, dizziness, severe HA, changes vision/speech, left arm numbness and tingling and jaw pain.  Cholesterol -Continue diet and exercise. Check cholesterol.   Vitamin D Def - check level and continue medications.   ADD Printed out, taking appropriately  Smoking cessation -  commended patient for wanting to quit and reviewed strategies for preventing relapses, try chantix  subconjuntival hemorrhage  Follow up eye doctor  Continue diet and meds as discussed. Further disposition pending results of labs. Over 30 minutes of exam, counseling, chart review, and critical decision making was performed Future Appointments Date Time Provider [place]  10/01/2017 9:00 AM Vicie Mutters, PA-C [id number] None    HPI 51 y.o. male  presents for 3 month follow up on hypertension, cholesterol, prediabetes, and vitamin D deficiency.   His blood pressure has been controlled at home, today their BP is BP: 126/70  He has ASHD history with stent in 2010, on bASA, continue to smoke.  Right eye woke up with conjunctival hemorraghic, does not wear contacts, no blurry vision, no cough, no HA, no dizziness, has had some sinus issues and blowing his nose. Due to see eye doctor.  Had left partial nephrectomy [id number]/3557, no complications since that time. Passed recent kidney stone, saw urology.   He does not workout. He denies chest pain, shortness of breath, dizziness. He takes adderall just during the week, 1-2 x a day, and only 3-4 days a week.   He is on cholesterol medication and denies myalgias. His cholesterol is at goal. The cholesterol last visit was:   Lab Results  Component Value Date   CHOL 131 10/01/2016   HDL 35 (L) 10/01/2016   LDLCALC 73 10/01/2016   TRIG 113 10/01/2016   CHOLHDL 3.7 10/01/2016    He has been working on diet and  exercise for prediabetes, and denies paresthesia of the feet, polydipsia, polyuria and visual disturbances. Last A1C in the office was:  Lab Results  Component Value Date   HGBA1C 5.6 10/01/2015   Patient is on Vitamin D supplement.   Lab Results  Component Value Date   VD25OH 39 10/01/2016     Still following Dr. Nelva Bush for his right ulnar neuropathy, has had nerve damage.  BMI is Body mass index is 28.98 kg/m., he is working on diet and exercise. Wt Readings from Last 3 Encounters:  02/05/17 202 lb (91.6 kg)  10/01/16 215 lb 9.6 oz (97.8 kg)  06/09/16 222 lb 12.8 oz (101.1 kg)    Current Medications:  Current Outpatient Prescriptions on File Prior to Visit  Medication Sig Dispense Refill  . amphetamine-dextroamphetamine (ADDERALL) 20 MG tablet Take 1 tablet (20 mg total) by mouth 2 (two) times daily. 60 tablet 0  . atorvastatin (LIPITOR) 80 MG tablet Take 1 tablet (80 mg total) by mouth daily. 90 tablet 0  . cetirizine (ZYRTEC) 10 MG tablet Take 10 mg by mouth daily as needed for allergies.     . fluticasone furoate-vilanterol (BREO ELLIPTA) 100-25 MCG/INH AEPB Inhale 1 puff into the lungs daily. Rinse mouth with water after each use 1 each 2  . lidocaine (LIDODERM) 5 % Place 1 patch onto the skin daily. Remove & Discard patch within 12 hours or as directed by MD 30 patch 3  . metoprolol tartrate (LOPRESSOR) 25 MG tablet Take 1 tablet (25 mg total) by mouth  2 (two) times daily. 180 tablet 1  . sulfamethoxazole-trimethoprim (BACTRIM DS,SEPTRA DS) 800-160 MG tablet Take 1 tablet by mouth 2 (two) times daily. 20 tablet 0   No current facility-administered medications on file prior to visit.    Medical History:  Past Medical History:  Diagnosis Date  . ADD (attention deficit disorder)   . Appendicitis   . ASHD (arteriosclerotic heart disease)   . Colitis   . Crohn's disease ([place])   . Eczema   . GERD (gastroesophageal reflux disease)   . Hyperlipidemia   . Hypertension   .  Left renal mass   . Morbid obesity ([place])   . Myocardial infarction [facility])    status post RCA stenting in [facility] 2011  . Positive for macroalbuminuria    Allergies: No Known Allergies   Review of Systems:  Review of Systems  Constitutional: Negative for chills, diaphoresis, fever and malaise/fatigue.  HENT: Negative for congestion and sore throat.   Eyes: Negative.   Respiratory: Negative for cough, hemoptysis, sputum production, shortness of breath and wheezing.   Cardiovascular: Negative.  Negative for chest pain and leg swelling.  Gastrointestinal: Negative.   Genitourinary: Negative.   Musculoskeletal: Negative for neck pain.  Neurological: Negative.   Psychiatric/Behavioral: Negative.     Family history- Review and unchanged Social history- Review and unchanged Physical Exam: BP 126/70   Pulse 77   Temp (!) 97.3 F (36.3 C)   Ht 5' 10"  (1.778 m)   Wt 202 lb (91.6 kg)   SpO2 95%   BMI 28.98 kg/m  Wt Readings from Last 3 Encounters:  02/05/17 202 lb (91.6 kg)  10/01/16 215 lb 9.6 oz (97.8 kg)  06/09/16 222 lb 12.8 oz (101.1 kg)   General Appearance: Well nourished, in no apparent distress. Eyes: PERRLA, EOMs, conjunctiva with hemorrhage right eye, no swelling, soft globe.  Sinuses: + Frontal/maxillary tenderness ENT/Mouth: Ext aud canals clear, TMs without erythema, bulging. No erythema, swelling, or exudate on post pharynx.  Tonsils not swollen or erythematous. Hearing normal.  Neck: Supple, thyroid normal.  Respiratory: Respiratory effort normal, BS equal bilaterally with wheezing without rales, rhonchi, or stridor.  Cardio: RRR with no MRGs. Brisk peripheral pulses without edema.  Abdomen: Soft, + BS,  Non tender, no guarding, rebound, hernias, masses. Lymphatics: Non tender without lymphadenopathy.  Musculoskeletal: Full ROM, 5/5 strength, Normal gait, right hand in soft cast Skin: Warm, dry without rashes, lesions, ecchymosis.  Neuro: Cranial nerves intact.  Normal muscle tone, no cerebellar symptoms. Psych: Awake and oriented X 3, normal affect, Insight and Judgment appropriate.    Vicie Mutters, PA-C 4:43 PM [facility]

## 2017-02-05 ENCOUNTER — Ambulatory Visit (INDEPENDENT_AMBULATORY_CARE_PROVIDER_SITE_OTHER): Payer: Federal, State, Local not specified - PPO | Admitting: Physician Assistant

## 2017-02-05 ENCOUNTER — Encounter: Payer: Self-pay | Admitting: Physician Assistant

## 2017-02-05 VITALS — BP 126/70 | HR 77 | Temp 97.3°F | Ht 70.0 in | Wt 202.0 lb

## 2017-02-05 DIAGNOSIS — K50019 Crohn's disease of small intestine with unspecified complications: Secondary | ICD-10-CM | POA: Diagnosis not present

## 2017-02-05 DIAGNOSIS — E559 Vitamin D deficiency, unspecified: Secondary | ICD-10-CM

## 2017-02-05 DIAGNOSIS — I2510 Atherosclerotic heart disease of native coronary artery without angina pectoris: Secondary | ICD-10-CM | POA: Diagnosis not present

## 2017-02-05 DIAGNOSIS — Z79899 Other long term (current) drug therapy: Secondary | ICD-10-CM

## 2017-02-05 DIAGNOSIS — E785 Hyperlipidemia, unspecified: Secondary | ICD-10-CM | POA: Diagnosis not present

## 2017-02-05 DIAGNOSIS — F909 Attention-deficit hyperactivity disorder, unspecified type: Secondary | ICD-10-CM | POA: Diagnosis not present

## 2017-02-05 DIAGNOSIS — I10 Essential (primary) hypertension: Secondary | ICD-10-CM

## 2017-02-05 LAB — CBC WITH DIFFERENTIAL/PLATELET
Basophils Absolute: 47 cells/uL (ref 0–200)
Basophils Relative: 0.5 %
Eosinophils Absolute: 226 cells/uL (ref 15–500)
Eosinophils Relative: 2.4 %
HCT: 41.4 % (ref 38.5–50.0)
Hemoglobin: 14 g/dL (ref 13.2–17.1)
Lymphs Abs: 1720 cells/uL (ref 850–3900)
MCH: 30.4 pg (ref 27.0–33.0)
MCHC: 33.8 g/dL (ref 32.0–36.0)
MCV: 90 fL (ref 80.0–100.0)
MONOS PCT: 6.9 %
MPV: 10.6 fL (ref 7.5–12.5)
Neutro Abs: 6759 cells/uL (ref 1500–7800)
Neutrophils Relative %: 71.9 %
PLATELETS: 305 10*3/uL (ref 140–400)
RBC: 4.6 10*6/uL (ref 4.20–5.80)
RDW: 13 % (ref 11.0–15.0)
TOTAL LYMPHOCYTE: 18.3 %
WBC: 9.4 10*3/uL (ref 3.8–10.8)
WBCMIX: 649 {cells}/uL (ref 200–950)

## 2017-02-05 LAB — LIPID PANEL
CHOL/HDL RATIO: 3.3 (calc) (ref <5.0)
Cholesterol: 118 mg/dL (ref <200)
HDL: 36 mg/dL — AB (ref >40)
LDL CHOLESTEROL (CALC): 62 mg/dL
NON-HDL CHOLESTEROL (CALC): 82 mg/dL (ref <130)
TRIGLYCERIDES: 122 mg/dL (ref <150)

## 2017-02-05 LAB — BASIC METABOLIC PANEL WITH GFR
BUN: 17 mg/dL (ref 7–25)
CALCIUM: 9.3 mg/dL (ref 8.6–10.3)
CO2: 23 mmol/L (ref 20–32)
Chloride: 105 mmol/L (ref 98–110)
Creat: 0.95 mg/dL (ref 0.70–1.33)
GFR, EST NON AFRICAN AMERICAN: 92 mL/min/{1.73_m2} (ref >=60)
GFR, Est African American: 106 mL/min/{1.73_m2} (ref >=60)
GLUCOSE: 86 mg/dL (ref 65–99)
Potassium: 4.1 mmol/L (ref 3.5–5.3)
SODIUM: 136 mmol/L (ref 135–146)

## 2017-02-05 LAB — HEPATIC FUNCTION PANEL
AG RATIO: 1.4 (calc) (ref 1.0–2.5)
ALBUMIN MSPROF: 4.3 g/dL (ref 3.6–5.1)
ALT: 24 U/L (ref 9–46)
AST: 20 U/L (ref 10–35)
Alkaline phosphatase (APISO): 106 U/L (ref 40–115)
BILIRUBIN TOTAL: 0.5 mg/dL (ref 0.2–1.2)
Bilirubin, Direct: 0.1 mg/dL (ref 0.0–0.2)
GLOBULIN: 3.1 g/dL (ref 1.9–3.7)
Indirect Bilirubin: 0.4 mg/dL (calc) (ref 0.2–1.2)
TOTAL PROTEIN: 7.4 g/dL (ref 6.1–8.1)

## 2017-02-05 LAB — TSH: TSH: 2.26 mIU/L (ref 0.40–4.50)

## 2017-02-05 MED ORDER — AMPHETAMINE-DEXTROAMPHETAMINE 20 MG PO TABS: 20.0000 mg | ORAL_TABLET | Freq: Two times a day (BID) | ORAL | 0 refills | Status: DC

## 2017-02-05 MED ORDER — ATORVASTATIN CALCIUM 80 MG PO TABS: 80.0000 mg | ORAL_TABLET | Freq: Every day | ORAL | 0 refills | Status: DC

## 2017-02-05 MED ORDER — VARENICLINE TARTRATE 1 MG PO TABS: 1.0000 mg | ORAL_TABLET | Freq: Two times a day (BID) | ORAL | 2 refills | Status: DC

## 2017-02-05 NOTE — Patient Instructions (Addendum)
Subconjunctival Hemorrhage Subconjunctival hemorrhage is bleeding that happens between the white part of your eye (sclera) and the clear membrane that covers the outside of your eye (conjunctiva). There are many tiny blood vessels near the surface of your eye. A subconjunctival hemorrhage happens when one or more of these vessels breaks and bleeds, causing a red patch to appear on your eye. This is similar to a bruise. Depending on the amount of bleeding, the red patch may only cover a small area of your eye or it may cover the entire visible part of the sclera. If a lot of blood collects under the conjunctiva, there may also be swelling. Subconjunctival hemorrhages do not affect your vision or cause pain, but your eye may feel irritated if there is swelling. Subconjunctival hemorrhages usually do not require treatment, and they disappear on their own within two weeks. What are the causes? This condition may be caused by:  Mild trauma, such as rubbing your eye too hard.  Severe trauma or blunt injuries.  Coughing, sneezing, or vomiting.  Straining, such as when lifting a heavy object.  High blood pressure.  Recent eye surgery.  A history of diabetes.  Certain medicines, especially blood thinners (anticoagulants).  Other conditions, such as eye tumors, bleeding disorders, or blood vessel abnormalities.  Subconjunctival hemorrhages can happen without an obvious cause. What are the signs or symptoms? Symptoms of this condition include:  A bright red or dark red patch on the white part of the eye. ? The red area may spread out to cover a larger area of the eye before it goes away. ? The red area may turn brownish-yellow before it goes away.  Swelling.  Mild eye irritation.  How is this diagnosed? This condition is diagnosed with a physical exam. If your subconjunctival hemorrhage was caused by trauma, your health care provider may refer you to an eye specialist (ophthalmologist) or  another specialist to check for other injuries. You may have other tests, including:  An eye exam.  A blood pressure check.  Blood tests to check for bleeding disorders.  If your subconjunctival hemorrhage was caused by trauma, X-rays or a CT scan may be done to check for other injuries. How is this treated? Usually, no treatment is needed. Your health care provider may recommend eye drops or cold compresses to help with discomfort. Follow these instructions at home:  Take over-the-counter and prescription medicines only as directed by your health care provider.  Use eye drops or cold compresses to help with discomfort as directed by your health care provider.  Avoid activities, things, and environments that may irritate or injure your eye.  Keep all follow-up visits as told by your health care provider. This is important. Contact a health care provider if:  You have pain in your eye.  The bleeding does not go away within 3 weeks.  You keep getting new subconjunctival hemorrhages. Get help right away if:  Your vision changes or you have difficulty seeing.  You suddenly develop severe sensitivity to light.  You develop a severe headache, persistent vomiting, confusion, or abnormal tiredness (lethargy).  Your eye seems to bulge or protrude from your eye socket.  You develop unexplained bruises on your body.  You have unexplained bleeding in another area of your body. This information is not intended to replace advice given to you by your health care provider. Make sure you discuss any questions you have with your health care provider. Document Released: 05/05/2005 Document Revised: 12/30/2015 Document  Reviewed: 07/12/2014 Elsevier Interactive Patient Education  [employer].  If you have a smart phone, please look up [employer], this will help you stay on track and give you information about money you have saved, life that you have gained back and a ton of more  information.   We are giving you chantix for smoking cessation. You can do it! And we are here to help! You may have heard some scary side effects about chantix, the three most common I hear about are nausea, crazy dreams and depression.  However, I like for my patients to try to stay on 1/2 a tablet twice a day rather than one tablet twice a day as normally prescribed. This helps decrease the chances of side effects and helps save money by making a one month prescription last two months  Please start the prescription this way:  Start 1/2 tablet by mouth once daily after food with a full glass of water for 3 days Then do 1/2 tablet by mouth twice daily for 4 days. During this first week you can smoke, but try to stop after this week.  At this point we have several options: 1) continue on 1/2 tablet twice a day- which I encourage you to do. You can stay on this dose the rest of the time on the medication or if you still feel the need to smoke you can do one of the two options below. 2) do one tablet in the morning and 1/2 in the evening which helps decrease dreams. 3) do one tablet twice a day.   What if I miss a dose? If you miss a dose, take it as soon as you can. If it is almost time for your next dose, take only that dose. Do not take double or extra doses.  What should I watch for while using this medicine? Visit your doctor or health care professional for regular check ups. Ask for ongoing advice and encouragement from your doctor or healthcare professional, friends, and family to help you quit. If you smoke while on this medication, quit again  Your mouth may get dry. Chewing sugarless gum or hard candy, and drinking plenty of water may help. Contact your doctor if the problem does not go away or is severe.  You may get drowsy or dizzy. Do not drive, use machinery, or do anything that needs mental alertness until you know how this medicine affects you. Do not stand or sit up quickly,  especially if you are an older patient.   The use of this medicine may increase the chance of suicidal thoughts or actions. Pay special attention to how you are responding while on this medicine. Any worsening of mood, or thoughts of suicide or dying should be reported to your health care professional right away.  ADVANTAGES OF QUITTING SMOKING  Within 20 minutes, blood pressure decreases. Your pulse is at normal level.  After 8 hours, carbon monoxide levels in the blood return to normal. Your oxygen level increases.  After 24 hours, the chance of having a heart attack starts to decrease. Your breath, hair, and body stop smelling like smoke.  After 48 hours, damaged nerve endings begin to recover. Your sense of taste and smell improve.  After 72 hours, the body is virtually free of nicotine. Your bronchial tubes relax and breathing becomes easier.  After 2 to 12 weeks, lungs can hold more air. Exercise becomes easier and circulation improves.  After 1 year, the risk  of coronary heart disease is cut in half.  After 5 years, the risk of stroke falls to the same as a nonsmoker.  After 10 years, the risk of lung cancer is cut in half and the risk of other cancers decreases significantly.  After 15 years, the risk of coronary heart disease drops, usually to the level of a nonsmoker.  You will have extra money to spend on things other than cigarettes.

## 2017-03-24 DIAGNOSIS — N201 Calculus of ureter: Secondary | ICD-10-CM | POA: Diagnosis not present

## 2017-03-24 DIAGNOSIS — C642 Malignant neoplasm of left kidney, except renal pelvis: Secondary | ICD-10-CM | POA: Diagnosis not present

## 2017-05-04 ENCOUNTER — Ambulatory Visit: Payer: Federal, State, Local not specified - PPO | Admitting: Physician Assistant

## 2017-05-04 ENCOUNTER — Encounter: Payer: Self-pay | Admitting: Physician Assistant

## 2017-05-04 VITALS — BP 124/82 | HR 86 | Temp 97.6°F | Resp 14 | Ht 70.0 in | Wt 208.0 lb

## 2017-05-04 DIAGNOSIS — F909 Attention-deficit hyperactivity disorder, unspecified type: Secondary | ICD-10-CM | POA: Diagnosis not present

## 2017-05-04 DIAGNOSIS — J320 Chronic maxillary sinusitis: Secondary | ICD-10-CM

## 2017-05-04 DIAGNOSIS — K50019 Crohn's disease of small intestine with unspecified complications: Secondary | ICD-10-CM | POA: Diagnosis not present

## 2017-05-04 DIAGNOSIS — I700 Atherosclerosis of aorta: Secondary | ICD-10-CM

## 2017-05-04 LAB — BASIC METABOLIC PANEL WITH GFR
BUN: 22 mg/dL (ref 7–25)
CO2: 25 mmol/L (ref 20–32)
Calcium: 9.1 mg/dL (ref 8.6–10.3)
Chloride: 103 mmol/L (ref 98–110)
Creat: 0.99 mg/dL (ref 0.70–1.33)
GFR, Est African American: 101 mL/min/{1.73_m2} (ref >=60)
GFR, Est Non African American: 87 mL/min/{1.73_m2} (ref >=60)
GLUCOSE: 94 mg/dL (ref 65–99)
POTASSIUM: 4.2 mmol/L (ref 3.5–5.3)
SODIUM: 136 mmol/L (ref 135–146)

## 2017-05-04 LAB — HEPATIC FUNCTION PANEL
AG Ratio: 1.3 (calc) (ref 1.0–2.5)
ALKALINE PHOSPHATASE (APISO): 100 U/L (ref 40–115)
ALT: 27 U/L (ref 9–46)
AST: 21 U/L (ref 10–35)
Albumin: 4 g/dL (ref 3.6–5.1)
BILIRUBIN INDIRECT: 0.2 mg/dL (ref 0.2–1.2)
Bilirubin, Direct: 0 mg/dL (ref 0.0–0.2)
Globulin: 3.1 g/dL (calc) (ref 1.9–3.7)
TOTAL PROTEIN: 7.1 g/dL (ref 6.1–8.1)
Total Bilirubin: 0.2 mg/dL (ref 0.2–1.2)

## 2017-05-04 LAB — CBC WITH DIFFERENTIAL/PLATELET
BASOS ABS: 60 {cells}/uL (ref 0–200)
Basophils Relative: 0.6 %
EOS PCT: 1.7 %
Eosinophils Absolute: 170 cells/uL (ref 15–500)
HCT: 29.4 % — ABNORMAL LOW (ref 38.5–50.0)
Hemoglobin: 9.7 g/dL — ABNORMAL LOW (ref 13.2–17.1)
Lymphs Abs: 1910 cells/uL (ref 850–3900)
MCH: 29.7 pg (ref 27.0–33.0)
MCHC: 33 g/dL (ref 32.0–36.0)
MCV: 89.9 fL (ref 80.0–100.0)
MONOS PCT: 5.8 %
MPV: 10.2 fL (ref 7.5–12.5)
NEUTROS ABS: 7280 {cells}/uL (ref 1500–7800)
NEUTROS PCT: 72.8 %
PLATELETS: 425 10*3/uL — AB (ref 140–400)
RBC: 3.27 10*6/uL — ABNORMAL LOW (ref 4.20–5.80)
RDW: 12.9 % (ref 11.0–15.0)
TOTAL LYMPHOCYTE: 19.1 %
WBC mixed population: 580 cells/uL (ref 200–950)
WBC: 10 10*3/uL (ref 3.8–10.8)

## 2017-05-04 MED ORDER — PREDNISONE 20 MG PO TABS: ORAL_TABLET | ORAL | 0 refills | Status: DC

## 2017-05-04 MED ORDER — LEVOFLOXACIN 500 MG PO TABS: 500.0000 mg | ORAL_TABLET | Freq: Every day | ORAL | 0 refills | Status: DC

## 2017-05-04 MED ORDER — AMPHETAMINE-DEXTROAMPHETAMINE 20 MG PO TABS: 20.0000 mg | ORAL_TABLET | Freq: Two times a day (BID) | ORAL | 0 refills | Status: DC

## 2017-05-04 NOTE — Patient Instructions (Signed)
Make sure you are on an allergy pill, see below for more details. Please take the prednisone as directed below, this is NOT an antibiotic so you do NOT have to finish it. You can take it for a few days and stop it if you are doing better.   Please take the prednisone to help decrease inflammation and therefore decrease symptoms. Take it it with food to avoid GI upset. It can cause increased energy but on the other hand it can make it hard to sleep at night so please take it AT [place], it takes 8-12 hours to start working so it will NOT affect your sleeping if you take it at night with your food!!  If you are diabetic it will increase your sugars so decrease carbs and monitor your sugars closely.    Take the antibiotic Take the prednisone This will help the sinus infection but may also treat a crohn's or colitis  Follow up with Dr. [name]  Please go to the ER if you have any severe AB pain, unable to hold down food/water, blood in stool or vomit, chest pain, shortness of breath, or any worsening symptoms.    Take omeprazole over the counter for 2 weeks, then go to zantac 150-300 mg OR pepcid 20 or 79m at night for 2 weeks, then you can stop or continue as needed.  Avoid alcohol, spicy foods, NSAIDS (aleve, ibuprofen) at this time. See foods below.   Food Choices for Gastroesophageal Reflux Disease When you have gastroesophageal reflux disease (GERD), the foods you eat and your eating habits are very important. Choosing the right foods can help ease the discomfort of GERD. WHAT GENERAL GUIDELINES DO I NEED TO FOLLOW?  Choose fruits, vegetables, whole grains, low-fat dairy products, and low-fat meat, fish, and poultry.  Limit fats such as oils, salad dressings, butter, nuts, and avocado.  Keep a food diary to identify foods that cause symptoms.  Avoid foods that cause reflux. These may be different for different people.  Eat frequent small meals instead of three large meals each  day.  Eat your meals slowly, in a relaxed setting.  Limit fried foods.  Cook foods using methods other than frying.  Avoid drinking alcohol.  Avoid drinking large amounts of liquids with your meals.  Avoid bending over or lying down until 2-3 hours after eating. WHAT FOODS ARE NOT RECOMMENDED? The following are some foods and drinks that may worsen your symptoms: Vegetables Tomatoes. Tomato juice. Tomato and spaghetti sauce. Chili peppers. Onion and garlic. Horseradish. Fruits Oranges, grapefruit, and lemon (fruit and juice). Meats High-fat meats, fish, and poultry. This includes hot dogs, ribs, ham, sausage, salami, and bacon. Dairy Whole milk and chocolate milk. Sour cream. Cream. Butter. Ice cream. Cream cheese.  Beverages Coffee and tea, with or without caffeine. Carbonated beverages or energy drinks. Condiments Hot sauce. Barbecue sauce.  Sweets/Desserts Chocolate and cocoa. Donuts. Peppermint and spearmint. Fats and Oils High-fat foods, including [place] and potato chips. Other Vinegar. Strong spices, such as black pepper, white pepper, red pepper, cayenne, curry powder, cloves, ginger, and chili powder.

## 2017-05-04 NOTE — Progress Notes (Signed)
Subjective:    Patient ID: Cole [name], male    DOB: Sep 30, 1965, 51 y.o.   MRN: [id number]  HPI 51 y.o. WM with history of chron's presents with multiple complaints.  He has had cold symptoms x 2 weeks. Sinus congestion, green, cough at night. No SOB, some chills/fever, and he will look very pale.  He has had bright red blood in stool, now dark black stool "like eating turnip greens", has been having GERD. Tums helps some. Was taking aleve, every day for HA/ankle for about a month, has been drinking hot bourbon at night due to cough but this is new. Was on zantac but stopped it. Some nausea no vomiting.  Has been having weakness/fatigue, no dizziness. No leg cramps.  Wife also states he has had some flushing. He has history of chron's, follows with Dr. [name]. Last colonoscopy was 02/2014, has never had EGD. CT AB 02/2014.   Blood pressure 124/82, pulse 86, temperature 97.6 F (36.4 C), resp. rate 14, height 5' 10"  (1.778 m), weight 208 lb (94.3 kg), SpO2 98 %.  Medications Current Outpatient Medications on File Prior to Visit  Medication Sig  . amphetamine-dextroamphetamine (ADDERALL) 20 MG tablet Take 1 tablet (20 mg total) by mouth 2 (two) times daily.  Marland Kitchen atorvastatin (LIPITOR) 80 MG tablet Take 1 tablet (80 mg total) by mouth daily.  . cetirizine (ZYRTEC) 10 MG tablet Take 10 mg by mouth daily as needed for allergies.   . fluticasone furoate-vilanterol (BREO ELLIPTA) 100-25 MCG/INH AEPB Inhale 1 puff into the lungs daily. Rinse mouth with water after each use  . lidocaine (LIDODERM) 5 % Place 1 patch onto the skin daily. Remove & Discard patch within 12 hours or as directed by MD  . metoprolol tartrate (LOPRESSOR) 25 MG tablet Take 1 tablet (25 mg total) by mouth 2 (two) times daily.  . varenicline (CHANTIX CONTINUING MONTH PAK) 1 MG tablet Take 1 tablet (1 mg total) by mouth 2 (two) times daily.   No current facility-administered medications on file prior to visit.     Problem  list He has Hyperlipidemia; Hypertension; ADD (attention deficit disorder); ASHD (arteriosclerotic heart disease); Obesity, BMI unknown; Crohn's disease of ileum ([place]); Smoker; Vitamin D deficiency; Medication management; and Left renal mass on their problem list.   Review of Systems  Constitutional: Positive for chills and fatigue. Negative for appetite change and fever.  HENT: Positive for congestion, ear pain, sinus pressure and sinus pain.   Respiratory: Negative.   Cardiovascular: Negative.   Gastrointestinal: Positive for blood in stool and diarrhea. Negative for abdominal distention, abdominal pain, anal bleeding, constipation, nausea, rectal pain and vomiting.       GERD  Genitourinary: Negative.   Musculoskeletal: Negative.   Skin: Negative.  Negative for rash.  Neurological: Negative.  Negative for dizziness.  Hematological: Negative.  Does not bruise/bleed easily.       Objective:   Physical Exam  Constitutional: He is oriented to person, place, and time. He appears well-developed and well-nourished.  HENT:  Head: Normocephalic and atraumatic.  Right Ear: Hearing and tympanic membrane normal.  Left Ear: Hearing and tympanic membrane normal.  Nose: Right sinus exhibits maxillary sinus tenderness. Left sinus exhibits maxillary sinus tenderness.  Mouth/Throat: Uvula is midline and mucous membranes are normal. Posterior oropharyngeal erythema present. No oropharyngeal exudate, posterior oropharyngeal edema or tonsillar abscesses.  Eyes: Conjunctivae are normal. Pupils are equal, round, and reactive to light.  Neck: Normal range of motion. Neck supple.  Cardiovascular: Normal rate and regular rhythm.  Pulmonary/Chest: Effort normal and breath sounds normal.  Abdominal: Soft. Bowel sounds are normal. There is tenderness (epigastric and RLQ). There is no rebound and no guarding.  Genitourinary: Prostate normal. Rectal exam shows guaiac positive stool. Rectal exam shows no  external hemorrhoid, no internal hemorrhoid, no fissure, no mass, no tenderness and anal tone normal.  Musculoskeletal: Normal range of motion.  Lymphadenopathy:    He has no cervical adenopathy.  Neurological: He is alert and oriented to person, place, and time.  Skin: Skin is warm and dry. No rash noted.        Assessment & Plan:    Crohn's disease of ileum with complication ([place]) + hemoccult in the office ? If from colitis versus upper GI Check CBC, BMP,  Refer to GI for possible EGD/colonoscopy, Stop NSAIDS, bland diet, increase fluids, If symptoms worsen, get CP, SOB, dizziness, etc go to the ER -     predniSONE (DELTASONE) 20 MG tablet; 2 tablets daily for 3 days, 1 tablet daily for 4 days. - nexium samples x 2 weeks, then zantac -     CBC with Differential/Platelet -     BASIC METABOLIC PANEL WITH GFR -     Hepatic function panel  Atherosclerosis of aorta (HCC) Control blood pressure, cholesterol, glucose, increase exercise.   Maxillary sinusitis, unspecified chronicity -     levofloxacin (LEVAQUIN) 500 MG tablet; Take 1 tablet (500 mg total) by mouth daily.  Attention deficit hyperactivity disorder (ADHD), unspecified ADHD type -     amphetamine-dextroamphetamine (ADDERALL) 20 MG tablet; Take 1 tablet (20 mg total) by mouth 2 (two) times daily.

## 2017-05-05 ENCOUNTER — Other Ambulatory Visit: Payer: Self-pay | Admitting: Physician Assistant

## 2017-05-05 ENCOUNTER — Other Ambulatory Visit (INDEPENDENT_AMBULATORY_CARE_PROVIDER_SITE_OTHER): Payer: Federal, State, Local not specified - PPO

## 2017-05-05 ENCOUNTER — Telehealth: Payer: Self-pay | Admitting: Internal Medicine

## 2017-05-05 ENCOUNTER — Other Ambulatory Visit: Payer: Self-pay

## 2017-05-05 ENCOUNTER — Ambulatory Visit: Payer: Federal, State, Local not specified - PPO | Admitting: Nurse Practitioner

## 2017-05-05 ENCOUNTER — Encounter: Payer: Self-pay | Admitting: Nurse Practitioner

## 2017-05-05 VITALS — BP 112/82 | HR 76 | Ht 70.0 in | Wt 206.4 lb

## 2017-05-05 DIAGNOSIS — K625 Hemorrhage of anus and rectum: Secondary | ICD-10-CM | POA: Diagnosis not present

## 2017-05-05 DIAGNOSIS — K50919 Crohn's disease, unspecified, with unspecified complications: Secondary | ICD-10-CM

## 2017-05-05 DIAGNOSIS — K922 Gastrointestinal hemorrhage, unspecified: Secondary | ICD-10-CM

## 2017-05-05 DIAGNOSIS — D62 Acute posthemorrhagic anemia: Secondary | ICD-10-CM

## 2017-05-05 LAB — BASIC METABOLIC PANEL
BUN: 18 mg/dL (ref 6–23)
CO2: 25 meq/L (ref 19–32)
Calcium: 8.8 mg/dL (ref 8.4–10.5)
Chloride: 106 mEq/L (ref 96–112)
Creatinine, Ser: 0.94 mg/dL (ref 0.40–1.50)
GFR: 89.29 mL/min (ref >60.00)
GLUCOSE: 96 mg/dL (ref 70–99)
POTASSIUM: 4 meq/L (ref 3.5–5.1)
SODIUM: 137 meq/L (ref 135–145)

## 2017-05-05 LAB — CBC
HEMATOCRIT: 27.2 % — AB (ref 39.0–52.0)
HEMOGLOBIN: 9.1 g/dL — AB (ref 13.0–17.0)
MCHC: 33.5 g/dL (ref 30.0–36.0)
MCV: 93.1 fl (ref 78.0–100.0)
PLATELETS: 398 10*3/uL (ref 150.0–400.0)
RBC: 2.92 Mil/uL — ABNORMAL LOW (ref 4.22–5.81)
RDW: 14.4 % (ref 11.5–15.5)
WBC: 11.7 10*3/uL — ABNORMAL HIGH (ref 4.0–10.5)

## 2017-05-05 MED ORDER — ESOMEPRAZOLE MAGNESIUM 40 MG PO CPDR: 40.0000 mg | DELAYED_RELEASE_CAPSULE | Freq: Two times a day (BID) | ORAL | 2 refills | Status: DC

## 2017-05-05 NOTE — Telephone Encounter (Signed)
Pt scheduled to see Tye Savoy NP today at 2pm. Pt aware of appt.

## 2017-05-05 NOTE — Progress Notes (Signed)
Chief Complaint:  Crohn's disease, rectal bleeding and anemia  HPI: Patient is a 51 year old male known to Dr. [name] for history of Crohn's disease. He is s/p ileocecectomy in [facility] 2011 after presenting with suppurative appendicitis with associated phlegmon. Pathology c/w  Crohn disease. We saw in in 2015 for evaluation of chronic diarrhea. He had a complete colonoscopy . The ileocolonic anastomosis was stenotic and ulcerated. The biopsies were not consistent with IBD.  He subsequently underwent SBFT revealing changes consistent with Crohn's disease involving the terminal ileum (5 cm segment). He subsequently underwent CT scan of the abdomen and pelvis which revealed some inflammation at the level of the anastomosis. Patient was last seen August 2017. He was not interested in treatment for Crohn's at the time despite feeling that he was at high risk for recurrent symptomatic disease.  We agreed to proceed with surveillance only per his wishes.  Patient is being seen today at request of PCP for evaluation of rectal bleeding and anemia. Hemoglobin 2 months ago was around his baseline of 14, it was 9.7 yesterday. Painless bleeding started 1.5 weeks ago, BMs are otherwise normal. Has 1-2 BMs a day, usually formed. Blood is deep red, a couple of days ago it turned black ( no pepto use). No abdominal pain, but nauseated. No fever but night sweats. He has a sinus infection, on prednisone and levaquin since yesterday. He has significant NSAID use.    Past Medical History:  Diagnosis Date  . ADD (attention deficit disorder)   . Appendicitis   . ASHD (arteriosclerotic heart disease)   . Colitis   . Crohn's disease ([place])   . Eczema   . GERD (gastroesophageal reflux disease)   . Hyperlipidemia   . Hypertension   . Left renal mass   . Morbid obesity ([place])   . Myocardial infarction [facility])    status post RCA stenting in [facility] 2011  . Positive for macroalbuminuria     Patient's surgical  history, family medical history, social history, medications and allergies were all reviewed in Epic    Physical Exam: BP 112/82   Pulse 76   Ht 5' 10"  (1.778 m)   Wt 206 lb 6.4 oz (93.6 kg)   SpO2 98%   BMI 29.62 kg/m   GENERAL: Well-developed white male in NAD PSYCH: :Pleasant, cooperative, normal affect EENT:  conjunctiva pink, mucous membranes moist, neck supple without masses CARDIAC:  RRR, no murmur heard, +noperipheral edema PULM: Normal respiratory effort, lungs CTA bilaterally, no wheezing ABDOMEN:  Nondistended, soft, nontender. No obvious masses, no hepatomegaly,  normal bowel sounds SKIN:  turgor, no lesions seen Musculoskeletal:  Normal muscle tone, normal strength NEURO: Alert and oriented x 3, no focal neurologic deficits   ASSESSMENT and PLAN:  4. 51 year old male with Ileal Crohn's disease. Initial presentation August 2011 with acute suppurative appendicitis [facility]). Follow-up colonoscopy October 2015, in addition to imaging studies suggested diseased 5 cm segment of the neo-ileum. Patient last evaluated Aug 2017 and at that time wanted surveillance only, no therapy.   2. Painless rectal bleeding / acute anemia. Hgb 9.7, it was 14 in late Sept. Blood deep red initially, now black over last couple of days -This is not typical for Crohn's flare and his BMs have basically been o/w normal. I am concerned patient may have PUD  or anastomotic ulcer in the setting of NSAID use. Will start with an upper endoscopy. Dr. [name] has made a spot for him on  the schedule this Thursday at noon. The risks and benefits of EGD were discussed and the patient agrees to proceed.  -repeat labs today -In the interim, stop NSAID's, increase PPI to twice a day. If develops significant dizziness, chest pain, shortness of breath or increased bleeding then he should go over to the ED for immediate evaluation -Avoid NSAIDs  Vicie Mutters, PA-C  Tye Savoy , NP 05/05/2017, 2:26 PM

## 2017-05-05 NOTE — Patient Instructions (Signed)
If you are age 52 or older, your body mass index should be between 23-30. Your Body mass index is 29.62 kg/m. If this is out of the aforementioned range listed, please consider follow up with your Primary Care Provider.  If you are age 70 or younger, your body mass index should be between 19-25. Your Body mass index is 29.62 kg/m. If this is out of the aformentioned range listed, please consider follow up with your Primary Care Provider.   You have been scheduled for an endoscopy. Please follow written instructions given to you at your visit today. If you use inhalers (even only as needed), please bring them with you on the day of your procedure. Your physician has requested that you go to www.startemmi.com and enter the access code given to you at your visit today. This web site gives a general overview about your procedure. However, you should still follow specific instructions given to you by our office regarding your preparation for the procedure.  Increase Nexium to twice daily.  Go to ED if weak, dizzy, or chest pain.  Thank you for choosing me and [facility].   Tye Savoy, NP

## 2017-05-06 ENCOUNTER — Encounter: Payer: Self-pay | Admitting: Physician Assistant

## 2017-05-06 NOTE — Progress Notes (Signed)
Assessment and plans reviewed with GI nurse practitioner.

## 2017-05-07 ENCOUNTER — Ambulatory Visit (AMBULATORY_SURGERY_CENTER): Payer: Federal, State, Local not specified - PPO | Admitting: Internal Medicine

## 2017-05-07 ENCOUNTER — Encounter: Payer: Self-pay | Admitting: Internal Medicine

## 2017-05-07 ENCOUNTER — Other Ambulatory Visit: Payer: Self-pay

## 2017-05-07 VITALS — BP 104/80 | HR 71 | Temp 97.5°F | Resp 13 | Ht 70.0 in | Wt 206.0 lb

## 2017-05-07 DIAGNOSIS — K921 Melena: Secondary | ICD-10-CM

## 2017-05-07 DIAGNOSIS — K622 Anal prolapse: Secondary | ICD-10-CM | POA: Diagnosis not present

## 2017-05-07 DIAGNOSIS — D62 Acute posthemorrhagic anemia: Secondary | ICD-10-CM | POA: Diagnosis not present

## 2017-05-07 DIAGNOSIS — K222 Esophageal obstruction: Secondary | ICD-10-CM

## 2017-05-07 MED ORDER — SODIUM CHLORIDE 0.9 % IV SOLN: 500.0000 mL | INTRAVENOUS | Status: DC

## 2017-05-07 NOTE — Progress Notes (Signed)
A and O x3. Report to RN. Tolerated MAC anesthesia well.Teeth unchanged after procedure.

## 2017-05-07 NOTE — Op Note (Signed)
[name] Procedure Date: 05/07/2017 12:02 PM MRN: [id number] Endoscopist: Docia Chuck. [name] , MD Age: 51 Referring MD:  Date of Birth: 05-Jul-1965 Gender: Male Account #: [id number] Procedure:                Upper GI endoscopy Indications:              Hematochezia, Melena. Acute blood loss anemia.                            History of ileal Crohn's Medicines:                Monitored Anesthesia Care Procedure:                Pre-Anesthesia Assessment:                           - Prior to the procedure, a History and Physical                            was performed, and patient medications and                            allergies were reviewed. The patient's tolerance of                            previous anesthesia was also reviewed. The risks                            and benefits of the procedure and the sedation                            options and risks were discussed with the patient.                            All questions were answered, and informed consent                            was obtained. Prior Anticoagulants: The patient has                            taken no previous anticoagulant or antiplatelet                            agents. ASA Grade Assessment: II - A patient with                            mild systemic disease. After reviewing the risks                            and benefits, the patient was deemed in                            satisfactory condition to undergo the procedure.  After obtaining informed consent, the endoscope was                            passed under direct vision. Throughout the                            procedure, the patient's blood pressure, pulse, and                            oxygen saturations were monitored continuously. The                            Endoscope was introduced through the mouth, and                            advanced to the Third part of  duodenum. The upper                            GI endoscopy was accomplished without difficulty.                            The patient tolerated the procedure well. Scope In: Scope Out: Findings:                 The esophagus was normal, Save large caliber                            esophageal ring.                           The stomach was normal.                           The examined duodenum was normal.                           The cardia and gastric fundus were normal on                            retroflexion. Complications:            No immediate complications. Estimated Blood Loss:     Estimated blood loss: none. Impression:               - Esophageal ring. Otherwise Normal esophagus.                           - Normal stomach.                           - Normal examined duodenum.                           - No specimens collected. Recommendation:           - Patient has a contact number available for  emergencies. The signs and symptoms of potential                            delayed complications were discussed with the                            patient. Return to normal activities tomorrow.                            Written discharge instructions were provided to the                            patient.                           - Resume previous diet.                           - Continue present medications.                           - Suspect bleeding in the region of the ileocolonic                            anastomosis where ulceration had been noted                            previously                           - Schedule colonoscopy in the [place]. [name], MD 05/07/2017 12:30:27 PM This report has been signed electronically.

## 2017-05-07 NOTE — Patient Instructions (Signed)
Esophageal ring.  Otherwise normal exam.  Resume previous diet.  Continue present medications.   Colonoscopy scheduled with patient.   YOU HAD AN ENDOSCOPIC PROCEDURE TODAY AT [facility]:   Refer to the procedure report that was given to you for any specific questions about what was found during the examination.  If the procedure report does not answer your questions, please call your gastroenterologist to clarify.  If you requested that your care partner not be given the details of your procedure findings, then the procedure report has been included in a sealed envelope for you to review at your convenience later.  YOU SHOULD EXPECT: Some feelings of bloating in the abdomen. Passage of more gas than usual.  Walking can help get rid of the air that was put into your GI tract during the procedure and reduce the bloating. If you had a lower endoscopy (such as a colonoscopy or flexible sigmoidoscopy) you may notice spotting of blood in your stool or on the toilet paper. If you underwent a bowel prep for your procedure, you may not have a normal bowel movement for a few days.  Please Note:  You might notice some irritation and congestion in your nose or some drainage.  This is from the oxygen used during your procedure.  There is no need for concern and it should clear up in a day or so.   SYMPTOMS TO REPORT IMMEDIATELY:    Following upper endoscopy (EGD)  Vomiting of blood or coffee ground material  New chest pain or pain under the shoulder blades  Painful or persistently difficult swallowing  New shortness of breath  Fever of 100F or higher  Black, tarry-looking stools  For urgent or emergent issues, a gastroenterologist can be reached at any hour by calling [phone].   DIET:  We do recommend a small meal at first, but then you may proceed to your regular diet.  Drink plenty of fluids but you should avoid alcoholic beverages for 24 hours.  ACTIVITY:  You should plan  to take it easy for the rest of today and you should NOT DRIVE or use heavy machinery until tomorrow (because of the sedation medicines used during the test).    FOLLOW UP: Our staff will call the number listed on your records the next business day following your procedure to check on you and address any questions or concerns that you may have regarding the information given to you following your procedure. If we do not reach you, we will leave a message.  However, if you are feeling well and you are not experiencing any problems, there is no need to return our call.  We will assume that you have returned to your regular daily activities without incident.  If any biopsies were taken you will be contacted by phone or by letter within the next 1-3 weeks.  Please call us at [phone] if you have not heard about the biopsies in 3 weeks.    SIGNATURES/CONFIDENTIALITY: You and/or your care partner have signed paperwork which will be entered into your electronic medical record.  These signatures attest to the fact that that the information above on your After Visit Summary has been reviewed and is understood.  Full responsibility of the confidentiality of this discharge information lies with you and/or your care-partner.

## 2017-05-08 ENCOUNTER — Telehealth: Payer: Self-pay

## 2017-05-08 NOTE — Telephone Encounter (Signed)
  Follow up Call-  Call back number 05/07/2017  Post procedure Call Back phone  # [phone]  Permission to leave phone message Yes  Some recent data might be hidden     Patient questions:  Do you have a fever, pain , or abdominal swelling? No. Pain Score  0 *  Have you tolerated food without any problems? Yes.    Have you been able to return to your normal activities? Yes.    Do you have any questions about your discharge instructions: Diet   No. Medications  No. Follow up visit  No.  Do you have questions or concerns about your Care? No.  Actions: * If pain score is 4 or above: No action needed, pain <4.

## 2017-05-25 ENCOUNTER — Ambulatory Visit: Payer: Self-pay | Admitting: Physician Assistant

## 2017-05-26 ENCOUNTER — Other Ambulatory Visit: Payer: Self-pay

## 2017-05-26 ENCOUNTER — Ambulatory Visit (AMBULATORY_SURGERY_CENTER): Payer: Self-pay

## 2017-05-26 VITALS — Ht 69.0 in | Wt 209.4 lb

## 2017-05-26 DIAGNOSIS — E785 Hyperlipidemia, unspecified: Secondary | ICD-10-CM | POA: Diagnosis not present

## 2017-05-26 DIAGNOSIS — R918 Other nonspecific abnormal finding of lung field: Secondary | ICD-10-CM | POA: Diagnosis not present

## 2017-05-26 DIAGNOSIS — R05 Cough: Secondary | ICD-10-CM | POA: Diagnosis not present

## 2017-05-26 DIAGNOSIS — R062 Wheezing: Secondary | ICD-10-CM | POA: Diagnosis not present

## 2017-05-26 DIAGNOSIS — K50919 Crohn's disease, unspecified, with unspecified complications: Secondary | ICD-10-CM

## 2017-05-26 DIAGNOSIS — Z79899 Other long term (current) drug therapy: Secondary | ICD-10-CM | POA: Diagnosis not present

## 2017-05-26 DIAGNOSIS — R0602 Shortness of breath: Secondary | ICD-10-CM | POA: Diagnosis not present

## 2017-05-26 DIAGNOSIS — I10 Essential (primary) hypertension: Secondary | ICD-10-CM | POA: Diagnosis not present

## 2017-05-26 DIAGNOSIS — F17200 Nicotine dependence, unspecified, uncomplicated: Secondary | ICD-10-CM | POA: Diagnosis not present

## 2017-05-26 DIAGNOSIS — Z720 Tobacco use: Secondary | ICD-10-CM | POA: Diagnosis not present

## 2017-05-26 DIAGNOSIS — J181 Lobar pneumonia, unspecified organism: Secondary | ICD-10-CM | POA: Diagnosis not present

## 2017-05-26 DIAGNOSIS — J90 Pleural effusion, not elsewhere classified: Secondary | ICD-10-CM | POA: Diagnosis not present

## 2017-05-26 DIAGNOSIS — Z7982 Long term (current) use of aspirin: Secondary | ICD-10-CM | POA: Diagnosis not present

## 2017-05-26 MED ORDER — PLENVU 140 G PO SOLR: 1.0000 | Freq: Once | ORAL | 0 refills | Status: AC

## 2017-05-26 NOTE — Progress Notes (Signed)
Denies allergies to eggs or soy products. Denies complication of anesthesia or sedation. Denies use of weight loss medication. Denies use of O2.   Emmi instructions declined.  

## 2017-06-01 ENCOUNTER — Telehealth: Payer: Self-pay | Admitting: Internal Medicine

## 2017-06-01 DIAGNOSIS — K50919 Crohn's disease, unspecified, with unspecified complications: Secondary | ICD-10-CM

## 2017-06-01 MED ORDER — NA SULFATE-K SULFATE-MG SULF 17.5-3.13-1.6 GM/177ML PO SOLN: ORAL | 0 refills | Status: DC

## 2017-06-01 NOTE — Telephone Encounter (Signed)
Plenvu not covered needs prior auth. Suprep called into patient's pharmacy, cost $55. Spoke with pt, he will pay that. suprep updated instructions mailed to pt. Pt aware.

## 2017-06-08 ENCOUNTER — Other Ambulatory Visit (INDEPENDENT_AMBULATORY_CARE_PROVIDER_SITE_OTHER): Payer: Federal, State, Local not specified - PPO

## 2017-06-08 ENCOUNTER — Encounter: Payer: Self-pay | Admitting: Internal Medicine

## 2017-06-08 ENCOUNTER — Telehealth: Payer: Self-pay | Admitting: *Deleted

## 2017-06-08 ENCOUNTER — Other Ambulatory Visit: Payer: Self-pay

## 2017-06-08 ENCOUNTER — Other Ambulatory Visit: Payer: Self-pay | Admitting: *Deleted

## 2017-06-08 ENCOUNTER — Ambulatory Visit (AMBULATORY_SURGERY_CENTER): Payer: Federal, State, Local not specified - PPO | Admitting: Internal Medicine

## 2017-06-08 VITALS — BP 111/64 | HR 60 | Temp 97.8°F | Resp 16 | Ht 69.0 in | Wt 209.0 lb

## 2017-06-08 DIAGNOSIS — K50919 Crohn's disease, unspecified, with unspecified complications: Secondary | ICD-10-CM | POA: Diagnosis not present

## 2017-06-08 DIAGNOSIS — Z1211 Encounter for screening for malignant neoplasm of colon: Secondary | ICD-10-CM | POA: Diagnosis not present

## 2017-06-08 DIAGNOSIS — D649 Anemia, unspecified: Secondary | ICD-10-CM

## 2017-06-08 LAB — IBC PANEL
IRON: 28 ug/dL — AB (ref 42–165)
Saturation Ratios: 5.9 % — ABNORMAL LOW (ref 20.0–50.0)
Transferrin: 341 mg/dL (ref 212.0–360.0)

## 2017-06-08 LAB — CBC WITH DIFFERENTIAL/PLATELET
BASOS ABS: 0.1 10*3/uL (ref 0.0–0.1)
Basophils Relative: 0.7 % (ref 0.0–3.0)
EOS PCT: 1.9 % (ref 0.0–5.0)
Eosinophils Absolute: 0.2 10*3/uL (ref 0.0–0.7)
HEMATOCRIT: 36.2 % — AB (ref 39.0–52.0)
Hemoglobin: 11.7 g/dL — ABNORMAL LOW (ref 13.0–17.0)
LYMPHS PCT: 14.1 % (ref 12.0–46.0)
Lymphs Abs: 1.4 10*3/uL (ref 0.7–4.0)
MCHC: 32.3 g/dL (ref 30.0–36.0)
MCV: 85.8 fl (ref 78.0–100.0)
MONOS PCT: 6.3 % (ref 3.0–12.0)
Monocytes Absolute: 0.6 10*3/uL (ref 0.1–1.0)
NEUTROS ABS: 7.7 10*3/uL (ref 1.4–7.7)
Neutrophils Relative %: 77 % (ref 43.0–77.0)
Platelets: 359 10*3/uL (ref 150.0–400.0)
RBC: 4.22 Mil/uL (ref 4.22–5.81)
RDW: 15.6 % — ABNORMAL HIGH (ref 11.5–15.5)
WBC: 9.9 10*3/uL (ref 4.0–10.5)

## 2017-06-08 LAB — FERRITIN: FERRITIN: 7.8 ng/mL — AB (ref 22.0–322.0)

## 2017-06-08 LAB — VITAMIN B12: Vitamin B-12: 528 pg/mL (ref 211–911)

## 2017-06-08 MED ORDER — SODIUM CHLORIDE 0.9 % IV SOLN: 500.0000 mL | Freq: Once | INTRAVENOUS | Status: DC

## 2017-06-08 NOTE — Progress Notes (Signed)
Pt is going down stairs to have labs done and discharged from lab.

## 2017-06-08 NOTE — Patient Instructions (Signed)
YOU HAD AN ENDOSCOPIC PROCEDURE TODAY AT [facility]:   Refer to the procedure report that was given to you for any specific questions about what was found during the examination.  If the procedure report does not answer your questions, please call your gastroenterologist to clarify.  If you requested that your care partner not be given the details of your procedure findings, then the procedure report has been included in a sealed envelope for you to review at your convenience later.  YOU SHOULD EXPECT: Some feelings of bloating in the abdomen. Passage of more gas than usual.  Walking can help get rid of the air that was put into your GI tract during the procedure and reduce the bloating. If you had a lower endoscopy (such as a colonoscopy or flexible sigmoidoscopy) you may notice spotting of blood in your stool or on the toilet paper. If you underwent a bowel prep for your procedure, you may not have a normal bowel movement for a few days.  Please Note:  You might notice some irritation and congestion in your nose or some drainage.  This is from the oxygen used during your procedure.  There is no need for concern and it should clear up in a day or so.  SYMPTOMS TO REPORT IMMEDIATELY:   Following lower endoscopy (colonoscopy or flexible sigmoidoscopy):  Excessive amounts of blood in the stool  Significant tenderness or worsening of abdominal pains  Swelling of the abdomen that is new, acute  Fever of 100F or higher  For urgent or emergent issues, a gastroenterologist can be reached at any hour by calling [phone].   DIET:  We do recommend a small meal at first, but then you may proceed to your regular diet.  Drink plenty of fluids but you should avoid alcoholic beverages for 24 hours.  ACTIVITY:  You should plan to take it easy for the rest of today and you should NOT DRIVE or use heavy machinery until tomorrow (because of the sedation medicines used during the test).     FOLLOW UP: Our staff will call the number listed on your records the next business day following your procedure to check on you and address any questions or concerns that you may have regarding the information given to you following your procedure. If we do not reach you, we will leave a message.  However, if you are feeling well and you are not experiencing any problems, there is no need to return our call.  We will assume that you have returned to your regular daily activities without incident.  If any biopsies were taken you will be contacted by phone or by letter within the next 1-3 weeks.  Please call us at [phone] if you have not heard about the biopsies in 3 weeks.  Repeat screening Colonoscopy in 5 years Office will call with a 3 month follow with Dr. [name]  SIGNATURES/CONFIDENTIALITY: You and/or your care partner have signed paperwork which will be entered into your electronic medical record.  These signatures attest to the fact that that the information above on your After Visit Summary has been reviewed and is understood.  Full responsibility of the confidentiality of this discharge information lies with you and/or your care-partner.

## 2017-06-08 NOTE — Telephone Encounter (Signed)
Early Chars, Dr. Blanch Media nurse in the [facility] today called to advise Dr. [name] wants this patient to have labs after his procedure today 06-08-2017.  CBC, Ferritin, IBC Panel B12 level. Labs entered.

## 2017-06-08 NOTE — Op Note (Signed)
[name] Patient Name: Cole [name] Procedure Date: 06/08/2017 7:59 AM MRN: [id number] Endoscopist: Docia Chuck. [name] , MD Age: 52 Referring MD:  Date of Birth: 01/03/1966 Gender: Male Account #: [id number] Procedure:                Colonoscopy Indications:              Melena, Acute post hemorrhagic anemia. History of                            suppurative appendicitis 2011 status post                            ileocecectomy (pathology Crohn's). Last colonoscopy                            October 2015 with ileocolonic anastomotic                            ulceration and narrowing subsequent imaging                            demonstrating active distal ileal Crohn's. On no                            therapy per patient's wishes. Recent EGD negative Medicines:                Monitored Anesthesia Care Procedure:                Pre-Anesthesia Assessment:                           - Prior to the procedure, a History and Physical                            was performed, and patient medications and                            allergies were reviewed. The patient's tolerance of                            previous anesthesia was also reviewed. The risks                            and benefits of the procedure and the sedation                            options and risks were discussed with the patient.                            All questions were answered, and informed consent                            was obtained. Prior Anticoagulants: The patient has  taken no previous anticoagulant or antiplatelet                            agents. ASA Grade Assessment: II - A patient with                            mild systemic disease. After reviewing the risks                            and benefits, the patient was deemed in                            satisfactory condition to undergo the procedure.                           After obtaining informed consent,  the colonoscope                            was passed under direct vision. Throughout the                            procedure, the patient's blood pressure, pulse, and                            oxygen saturations were monitored continuously. The                            Colonoscope was introduced through the anus and                            advanced to the the ileocolonic anastomosis. The                            rectum and ileocolonic anastomosis were                            photographed. The quality of the bowel preparation                            was excellent. The colonoscopy was performed                            without difficulty. The patient tolerated the                            procedure well. The bowel preparation used was                            SUPREP. Scope In: 8:05:27 AM Scope Out: 8:13:31 AM Scope Withdrawal Time: 0 hours 6 minutes 14 seconds  Total Procedure Duration: 0 hours 8 minutes 4 seconds  Findings:                 The exam was otherwise without abnormality on  direct and retroflexion views. Complications:            No immediate complications. Estimated blood loss:                            None. Estimated Blood Loss:     Estimated blood loss: none. Impression:               - The examination was otherwise normal on direct                            and retroflexion views.                           - No specimens collected. Recommendation:           - Repeat colonoscopy in 5 years for surveillance.                           - Patient has a contact number available for                            emergencies. The signs and symptoms of potential                            delayed complications were discussed with the                            patient. Return to normal activities tomorrow.                            Written discharge instructions were provided to the                            patient.                            - Resume previous diet.                           - Continue present medications.                           - Please obtain CBC, ferritin, iron saturation, and                            B12 level TODAY                           - Please make office follow-up with Dr. [name] about                            3 months Docia Chuck. [name], MD 06/08/2017 8:22:36 AM This report has been signed electronically.

## 2017-06-08 NOTE — Progress Notes (Signed)
A and O x3. Report to RN. Tolerated MAC anesthesia well.

## 2017-06-08 NOTE — Progress Notes (Signed)
Pt's states no medical or surgical changes since previsit or office visit.No allergy to soy or eggs.

## 2017-06-09 ENCOUNTER — Other Ambulatory Visit: Payer: Self-pay

## 2017-06-09 ENCOUNTER — Telehealth: Payer: Self-pay

## 2017-06-09 DIAGNOSIS — D509 Iron deficiency anemia, unspecified: Secondary | ICD-10-CM

## 2017-06-09 NOTE — Telephone Encounter (Signed)
  Follow up Call-  Call back number 06/08/2017 05/07/2017  Post procedure Call Back phone  # [phone]  Permission to leave phone message Yes Yes  Some recent data might be hidden     Patient questions:  Do you have a fever, pain , or abdominal swelling? No. Pain Score  0 *  Have you tolerated food without any problems? Yes.    Have you been able to return to your normal activities? Yes.    Do you have any questions about your discharge instructions: Diet   No. Medications  No. Follow up visit  No.  Do you have questions or concerns about your Care? No.  Actions: * If pain score is 4 or above: No action needed, pain <4.

## 2017-06-22 ENCOUNTER — Other Ambulatory Visit: Payer: Self-pay | Admitting: *Deleted

## 2017-06-22 DIAGNOSIS — R001 Bradycardia, unspecified: Secondary | ICD-10-CM

## 2017-06-22 DIAGNOSIS — I10 Essential (primary) hypertension: Secondary | ICD-10-CM

## 2017-06-22 MED ORDER — METOPROLOL TARTRATE 25 MG PO TABS: 25.0000 mg | ORAL_TABLET | Freq: Two times a day (BID) | ORAL | 1 refills | Status: DC

## 2017-06-29 ENCOUNTER — Ambulatory Visit: Payer: Self-pay | Admitting: Physician Assistant

## 2017-06-30 NOTE — Progress Notes (Signed)
Assessment and Plan:  Hypertension -Continue medication, monitor blood pressure at home. Continue DASH diet.  Reminder to go to the ER if any CP, SOB, nausea, dizziness, severe HA, changes vision/speech, left arm numbness and tingling and jaw pain.  Cholesterol -Continue diet and exercise. Check cholesterol.   Vitamin D Def - check level and continue medications.   ADD Printed out, taking appropriately  Smoking cessation -  commended patient for wanting to quit and reviewed strategies for preventing relapses  Cough Will repeat XRAY since smoker to assure resolution, get on breo.   Continue diet and meds as discussed. Further disposition pending results of labs. Over 30 minutes of exam, counseling, chart review, and critical decision making was performed Future Appointments  Date Time Provider [place]  09/01/2017  1:45 PM Irene Shipper, MD [facility]  10/01/2017  9:00 AM Vicie Mutters, PA-C [facility] None    HPI 52 y.o. male  presents for 3 month follow up on hypertension, cholesterol, prediabetes, and vitamin D deficiency.   He is on nexium BID for stomach ulcer, following with Dr. [name] has follow up.  He was seen for pneumonia in the ER 01/08, will repeat xray to assure resolution, patient is still coughing, still smoking. He has not been doing breo.    His blood pressure has been controlled at home, today their BP is BP: 130/74  He has ASHD history with stent in 2010, on bASA, continue to smoke.  Had left partial nephrectomy 25/8527, no complications since that time. Passed recent kidney stone, saw urology.   He does not workout. He denies chest pain, shortness of breath, dizziness. He takes adderall just during the week, 1-2 x a day, and only 3-4 days a week.   He is on cholesterol medication and denies myalgias. His cholesterol is at goal. The cholesterol last visit was:   Lab Results  Component Value Date   CHOL 118 02/05/2017   HDL 36 (L) 02/05/2017    LDLCALC 73 10/01/2016   TRIG 122 02/05/2017   CHOLHDL 3.3 02/05/2017    He has been working on diet and exercise for prediabetes, and denies paresthesia of the feet, polydipsia, polyuria and visual disturbances. Last A1C in the office was:  Lab Results  Component Value Date   HGBA1C 5.6 10/01/2015   Patient is on Vitamin D supplement.   Lab Results  Component Value Date   VD25OH 39 10/01/2016     Still following Dr. Nelva Bush for his right ulnar neuropathy, has had nerve damage.  BMI is Body mass index is 30.48 kg/m., he is working on diet and exercise. Wt Readings from Last 3 Encounters:  07/02/17 206 lb 6.4 oz (93.6 kg)  06/08/17 209 lb (94.8 kg)  05/26/17 209 lb 6.4 oz (95 kg)    Current Medications:  Current Outpatient Medications on File Prior to Visit  Medication Sig Dispense Refill  . cetirizine (ZYRTEC) 10 MG tablet Take 10 mg by mouth daily as needed for allergies.     Marland Kitchen esomeprazole (NEXIUM) 40 MG capsule Take 1 capsule (40 mg total) by mouth 2 (two) times daily before a meal. 60 capsule 2  . fluticasone furoate-vilanterol (BREO ELLIPTA) 100-25 MCG/INH AEPB Inhale 1 puff into the lungs daily. Rinse mouth with water after each use 1 each 2  . metoprolol tartrate (LOPRESSOR) 25 MG tablet Take 1 tablet (25 mg total) by mouth 2 (two) times daily. 180 tablet 1   Current Facility-Administered Medications on File Prior to  Visit  Medication Dose Route Frequency Provider Last Rate Last Dose  . 0.9 %  sodium chloride infusion  500 mL Intravenous Continuous Irene Shipper, MD      . 0.9 %  sodium chloride infusion  500 mL Intravenous Once Irene Shipper, MD       Medical History:  Past Medical History:  Diagnosis Date  . ADD (attention deficit disorder)   . Allergy   . Appendicitis   . Arthritis   . ASHD (arteriosclerotic heart disease)   . Cancer ([place])   . Colitis   . Crohn's disease ([place])   . Eczema   . GERD (gastroesophageal reflux disease)   . Hyperlipidemia   .  Hypertension   . Left renal mass   . Morbid obesity ([place])   . Myocardial infarction [facility])    status post RCA stenting in [facility] 2011  . Positive for macroalbuminuria    Allergies: No Known Allergies   Review of Systems:  Review of Systems  Constitutional: Negative for chills, diaphoresis, fever and malaise/fatigue.  HENT: Negative for congestion and sore throat.   Eyes: Negative.   Respiratory: Negative for cough, hemoptysis, sputum production, shortness of breath and wheezing.   Cardiovascular: Negative.  Negative for chest pain and leg swelling.  Gastrointestinal: Negative.   Genitourinary: Negative.   Musculoskeletal: Negative for neck pain.  Neurological: Negative.   Psychiatric/Behavioral: Negative.     Family history- Review and unchanged Social history- Review and unchanged Physical Exam: BP 130/74   Pulse 66   Resp 16   Ht 5' 9"  (1.753 m)   Wt 206 lb 6.4 oz (93.6 kg)   SpO2 96%   BMI 30.48 kg/m  Wt Readings from Last 3 Encounters:  07/02/17 206 lb 6.4 oz (93.6 kg)  06/08/17 209 lb (94.8 kg)  05/26/17 209 lb 6.4 oz (95 kg)   General Appearance: Well nourished, in no apparent distress. Eyes: PERRLA, EOMs, conjunctiva with hemorrhage right eye, no swelling, soft globe.  Sinuses: + Frontal/maxillary tenderness ENT/Mouth: Ext aud canals clear, TMs without erythema, bulging. No erythema, swelling, or exudate on post pharynx.  Tonsils not swollen or erythematous. Hearing normal.  Neck: Supple, thyroid normal.  Respiratory: Respiratory effort normal, BS equal bilaterally with wheezing without rales, rhonchi, or stridor.  Cardio: RRR with no MRGs. Brisk peripheral pulses without edema.  Abdomen: Soft, + BS,  Non tender, no guarding, rebound, hernias, masses. Lymphatics: Non tender without lymphadenopathy.  Musculoskeletal: Full ROM, 5/5 strength, Normal gait, right hand in soft cast Skin: Warm, dry without rashes, lesions, ecchymosis.  Neuro: Cranial nerves  intact. Normal muscle tone, no cerebellar symptoms. Psych: Awake and oriented X 3, normal affect, Insight and Judgment appropriate.    Vicie Mutters, PA-C 11:02 AM [facility]

## 2017-07-02 ENCOUNTER — Other Ambulatory Visit: Payer: Self-pay

## 2017-07-02 ENCOUNTER — Ambulatory Visit (HOSPITAL_COMMUNITY)
Admission: RE | Admit: 2017-07-02 | Discharge: 2017-07-02 | Disposition: A | Discharge status: Home / Self Care | Payer: Federal, State, Local not specified - PPO | Source: Ambulatory Visit | Attending: Physician Assistant | Admitting: Physician Assistant

## 2017-07-02 ENCOUNTER — Ambulatory Visit: Payer: Federal, State, Local not specified - PPO | Admitting: Physician Assistant

## 2017-07-02 ENCOUNTER — Encounter: Payer: Self-pay | Admitting: Physician Assistant

## 2017-07-02 VITALS — BP 130/74 | HR 66 | Resp 16 | Ht 69.0 in | Wt 206.4 lb

## 2017-07-02 DIAGNOSIS — I10 Essential (primary) hypertension: Secondary | ICD-10-CM | POA: Diagnosis not present

## 2017-07-02 DIAGNOSIS — I2510 Atherosclerotic heart disease of native coronary artery without angina pectoris: Secondary | ICD-10-CM | POA: Diagnosis not present

## 2017-07-02 DIAGNOSIS — K50019 Crohn's disease of small intestine with unspecified complications: Secondary | ICD-10-CM

## 2017-07-02 DIAGNOSIS — E785 Hyperlipidemia, unspecified: Secondary | ICD-10-CM

## 2017-07-02 DIAGNOSIS — D649 Anemia, unspecified: Secondary | ICD-10-CM

## 2017-07-02 DIAGNOSIS — Z79899 Other long term (current) drug therapy: Secondary | ICD-10-CM

## 2017-07-02 DIAGNOSIS — I700 Atherosclerosis of aorta: Secondary | ICD-10-CM | POA: Diagnosis not present

## 2017-07-02 DIAGNOSIS — R059 Cough, unspecified: Secondary | ICD-10-CM

## 2017-07-02 DIAGNOSIS — R05 Cough: Secondary | ICD-10-CM

## 2017-07-02 DIAGNOSIS — F909 Attention-deficit hyperactivity disorder, unspecified type: Secondary | ICD-10-CM

## 2017-07-02 MED ORDER — ATORVASTATIN CALCIUM 80 MG PO TABS: 80.0000 mg | ORAL_TABLET | Freq: Every day | ORAL | 0 refills | Status: DC

## 2017-07-02 MED ORDER — LIDOCAINE 5 % EX PTCH: 1.0000 | MEDICATED_PATCH | CUTANEOUS | 3 refills | Status: DC

## 2017-07-02 MED ORDER — ALBUTEROL SULFATE HFA 108 (90 BASE) MCG/ACT IN AERS: 2.0000 | INHALATION_SPRAY | Freq: Four times a day (QID) | RESPIRATORY_TRACT | 3 refills | Status: DC | PRN

## 2017-07-02 MED ORDER — AMPHETAMINE-DEXTROAMPHETAMINE 20 MG PO TABS: 20.0000 mg | ORAL_TABLET | Freq: Two times a day (BID) | ORAL | 0 refills | Status: DC

## 2017-07-02 NOTE — Patient Instructions (Signed)
If you have a smart phone, please look up Smoke Free app, this will help you stay on track and give you information about money you have saved, life that you have gained back and a ton of more information.   We are giving you chantix for smoking cessation. You can do it! And we are here to help! You may have heard some scary side effects about chantix, the three most common I hear about are nausea, crazy dreams and depression.  However, I like for my patients to try to stay on 1/2 a tablet twice a day rather than one tablet twice a day as normally prescribed. This helps decrease the chances of side effects and helps save money by making a one month prescription last two months  Please start the prescription this way:  Start 1/2 tablet by mouth once daily after food with a full glass of water for 3 days Then do 1/2 tablet by mouth twice daily for 4 days. During this first week you can smoke, but try to stop after this week.  At this point we have several options: 1) continue on 1/2 tablet twice a day- which I encourage you to do. You can stay on this dose the rest of the time on the medication or if you still feel the need to smoke you can do one of the two options below. 2) do one tablet in the morning and 1/2 in the evening which helps decrease dreams. 3) do one tablet twice a day.   What if I miss a dose? If you miss a dose, take it as soon as you can. If it is almost time for your next dose, take only that dose. Do not take double or extra doses.  What should I watch for while using this medicine? Visit your doctor or health care professional for regular check ups. Ask for ongoing advice and encouragement from your doctor or healthcare professional, friends, and family to help you quit. If you smoke while on this medication, quit again  Your mouth may get dry. Chewing sugarless gum or hard candy, and drinking plenty of water may help. Contact your doctor if the problem does not go away or is  severe.  You may get drowsy or dizzy. Do not drive, use machinery, or do anything that needs mental alertness until you know how this medicine affects you. Do not stand or sit up quickly, especially if you are an older patient.   The use of this medicine may increase the chance of suicidal thoughts or actions. Pay special attention to how you are responding while on this medicine. Any worsening of mood, or thoughts of suicide or dying should be reported to your health care professional right away.  ADVANTAGES OF QUITTING SMOKING  Within 20 minutes, blood pressure decreases. Your pulse is at normal level.  After 8 hours, carbon monoxide levels in the blood return to normal. Your oxygen level increases.  After 24 hours, the chance of having a heart attack starts to decrease. Your breath, hair, and body stop smelling like smoke.  After 48 hours, damaged nerve endings begin to recover. Your sense of taste and smell improve.  After 72 hours, the body is virtually free of nicotine. Your bronchial tubes relax and breathing becomes easier.  After 2 to 12 weeks, lungs can hold more air. Exercise becomes easier and circulation improves.  After 1 year, the risk of coronary heart disease is cut in half.  After 5 years,  the risk of stroke falls to the same as a nonsmoker.  After 10 years, the risk of lung cancer is cut in half and the risk of other cancers decreases significantly.  After 15 years, the risk of coronary heart disease drops, usually to the level of a nonsmoker.  You will have extra money to spend on things other than cigarettes.   Anemia Anemia is a condition in which you do not have enough red blood cells or hemoglobin. Hemoglobin is a substance in red blood cells that carries oxygen. When you do not have enough red blood cells or hemoglobin (are anemic), your body cannot get enough oxygen and your organs may not work properly. As a result, you may feel very tired or have other  problems. What are the causes? Common causes of anemia include:  Excessive bleeding. Anemia can be caused by excessive bleeding inside or outside the body, including bleeding from the intestine or from periods in women.  Poor nutrition.  Long-lasting (chronic) kidney, thyroid, and liver disease.  Bone marrow disorders.  Cancer and treatments for cancer.  HIV (human immunodeficiency virus) and AIDS (acquired immunodeficiency syndrome).  Treatments for HIV and AIDS.  Spleen problems.  Blood disorders.  Infections, medicines, and autoimmune disorders that destroy red blood cells.  What are the signs or symptoms? Symptoms of this condition include:  Minor weakness.  Dizziness.  Headache.  Feeling heartbeats that are irregular or faster than normal (palpitations).  Shortness of breath, especially with exercise.  Paleness.  Cold sensitivity.  Indigestion.  Nausea.  Difficulty sleeping.  Difficulty concentrating.  Symptoms may occur suddenly or develop slowly. If your anemia is mild, you may not have symptoms. How is this diagnosed? This condition is diagnosed based on:  Blood tests.  Your medical history.  A physical exam.  Bone marrow biopsy.  Your health care provider may also check your stool (feces) for blood and may do additional testing to look for the cause of your bleeding. You may also have other tests, including:  Imaging tests, such as a CT scan or MRI.  Endoscopy.  Colonoscopy.  How is this treated? Treatment for this condition depends on the cause. If you continue to lose a lot of blood, you may need to be treated at a hospital. Treatment may include:  Taking supplements of iron, vitamin H20, or folic acid.  Taking a hormone medicine (erythropoietin) that can help to stimulate red blood cell growth.  Having a blood transfusion. This may be needed if you lose a lot of blood.  Making changes to your diet.  Having surgery to remove  your spleen.  Follow these instructions at home:  Take over-the-counter and prescription medicines only as told by your health care provider.  Take supplements only as told by your health care provider.  Follow any diet instructions that you were given.  Keep all follow-up visits as told by your health care provider. This is important. Contact a health care provider if:  You develop new bleeding anywhere in the body. Get help right away if:  You are very weak.  You are short of breath.  You have pain in your abdomen or chest.  You are dizzy or feel faint.  You have trouble concentrating.  You have bloody or black, tarry stools.  You vomit repeatedly or you vomit up blood. Summary  Anemia is a condition in which you do not have enough red blood cells or enough of a substance in your red blood  cells that carries oxygen (hemoglobin).  Symptoms may occur suddenly or develop slowly.  If your anemia is mild, you may not have symptoms.  This condition is diagnosed with blood tests as well as a medical history and physical exam. Other tests may be needed.  Treatment for this condition depends on the cause of the anemia. This information is not intended to replace advice given to you by your health care provider. Make sure you discuss any questions you have with your health care provider. Document Released: 06/12/2004 Document Revised: 06/06/2016 Document Reviewed: 06/06/2016 Elsevier Interactive Patient Education  [employer].

## 2017-07-03 LAB — CBC WITH DIFFERENTIAL/PLATELET
Basophils Absolute: 69 cells/uL (ref 0–200)
Basophils Relative: 0.9 %
EOS PCT: 2.8 %
Eosinophils Absolute: 216 cells/uL (ref 15–500)
HCT: 36.5 % — ABNORMAL LOW (ref 38.5–50.0)
Hemoglobin: 11.9 g/dL — ABNORMAL LOW (ref 13.2–17.1)
Lymphs Abs: 1394 cells/uL (ref 850–3900)
MCH: 27.6 pg (ref 27.0–33.0)
MCHC: 32.6 g/dL (ref 32.0–36.0)
MCV: 84.7 fL (ref 80.0–100.0)
MPV: 10.4 fL (ref 7.5–12.5)
Monocytes Relative: 7.2 %
NEUTROS PCT: 71 %
Neutro Abs: 5467 cells/uL (ref 1500–7800)
Platelets: 367 10*3/uL (ref 140–400)
RBC: 4.31 10*6/uL (ref 4.20–5.80)
RDW: 15.8 % — AB (ref 11.0–15.0)
TOTAL LYMPHOCYTE: 18.1 %
WBC mixed population: 554 cells/uL (ref 200–950)
WBC: 7.7 10*3/uL (ref 3.8–10.8)

## 2017-07-03 LAB — BASIC METABOLIC PANEL WITH GFR
BUN: 15 mg/dL (ref 7–25)
CALCIUM: 9.1 mg/dL (ref 8.6–10.3)
CO2: 25 mmol/L (ref 20–32)
Chloride: 109 mmol/L (ref 98–110)
Creat: 0.91 mg/dL (ref 0.70–1.33)
GFR, EST AFRICAN AMERICAN: 112 mL/min/{1.73_m2} (ref >=60)
GFR, Est Non African American: 97 mL/min/{1.73_m2} (ref >=60)
Glucose, Bld: 89 mg/dL (ref 65–99)
Potassium: 4.6 mmol/L (ref 3.5–5.3)
SODIUM: 140 mmol/L (ref 135–146)

## 2017-07-03 LAB — IRON, TOTAL/TOTAL IRON BINDING CAP
%SAT: 23 % (calc) (ref 15–60)
Iron: 77 ug/dL (ref 50–180)
TIBC: 334 ug/dL (ref 250–425)

## 2017-07-03 LAB — HEPATIC FUNCTION PANEL
AG Ratio: 1.5 (calc) (ref 1.0–2.5)
ALBUMIN MSPROF: 3.9 g/dL (ref 3.6–5.1)
ALKALINE PHOSPHATASE (APISO): 105 U/L (ref 40–115)
ALT: 20 U/L (ref 9–46)
AST: 17 U/L (ref 10–35)
BILIRUBIN DIRECT: 0.1 mg/dL (ref 0.0–0.2)
Globulin: 2.6 g/dL (calc) (ref 1.9–3.7)
Indirect Bilirubin: 0.2 mg/dL (calc) (ref 0.2–1.2)
Total Bilirubin: 0.3 mg/dL (ref 0.2–1.2)
Total Protein: 6.5 g/dL (ref 6.1–8.1)

## 2017-07-03 LAB — LIPID PANEL
CHOL/HDL RATIO: 3.3 (calc) (ref <5.0)
CHOLESTEROL: 102 mg/dL (ref <200)
HDL: 31 mg/dL — ABNORMAL LOW (ref >40)
LDL CHOLESTEROL (CALC): 51 mg/dL
Non-HDL Cholesterol (Calc): 71 mg/dL (calc) (ref <130)
Triglycerides: 121 mg/dL (ref <150)

## 2017-07-03 LAB — FERRITIN: FERRITIN: 29 ng/mL (ref 20–380)

## 2017-07-03 LAB — TSH: TSH: 1.24 mIU/L (ref 0.40–4.50)

## 2017-09-01 ENCOUNTER — Ambulatory Visit: Payer: Federal, State, Local not specified - PPO | Admitting: Internal Medicine

## 2017-10-01 ENCOUNTER — Encounter: Payer: Self-pay | Admitting: Physician Assistant

## 2017-10-01 ENCOUNTER — Ambulatory Visit: Payer: Federal, State, Local not specified - PPO | Admitting: Physician Assistant

## 2017-10-01 ENCOUNTER — Other Ambulatory Visit: Payer: Self-pay

## 2017-10-01 VITALS — BP 122/78 | HR 87 | Temp 97.5°F | Resp 18 | Ht 69.5 in | Wt 208.2 lb

## 2017-10-01 DIAGNOSIS — Z0001 Encounter for general adult medical examination with abnormal findings: Secondary | ICD-10-CM

## 2017-10-01 DIAGNOSIS — F988 Other specified behavioral and emotional disorders with onset usually occurring in childhood and adolescence: Secondary | ICD-10-CM

## 2017-10-01 DIAGNOSIS — N2889 Other specified disorders of kidney and ureter: Secondary | ICD-10-CM

## 2017-10-01 DIAGNOSIS — Z79899 Other long term (current) drug therapy: Secondary | ICD-10-CM | POA: Diagnosis not present

## 2017-10-01 DIAGNOSIS — E559 Vitamin D deficiency, unspecified: Secondary | ICD-10-CM | POA: Diagnosis not present

## 2017-10-01 DIAGNOSIS — Z1389 Encounter for screening for other disorder: Secondary | ICD-10-CM

## 2017-10-01 DIAGNOSIS — I700 Atherosclerosis of aorta: Secondary | ICD-10-CM

## 2017-10-01 DIAGNOSIS — I10 Essential (primary) hypertension: Secondary | ICD-10-CM

## 2017-10-01 DIAGNOSIS — Z6830 Body mass index (BMI) 30.0-30.9, adult: Secondary | ICD-10-CM

## 2017-10-01 DIAGNOSIS — K50019 Crohn's disease of small intestine with unspecified complications: Secondary | ICD-10-CM

## 2017-10-01 DIAGNOSIS — Z125 Encounter for screening for malignant neoplasm of prostate: Secondary | ICD-10-CM | POA: Diagnosis not present

## 2017-10-01 DIAGNOSIS — Z0000 Encounter for general adult medical examination without abnormal findings: Secondary | ICD-10-CM | POA: Diagnosis not present

## 2017-10-01 DIAGNOSIS — R001 Bradycardia, unspecified: Secondary | ICD-10-CM

## 2017-10-01 DIAGNOSIS — Z13220 Encounter for screening for lipoid disorders: Secondary | ICD-10-CM

## 2017-10-01 DIAGNOSIS — N401 Enlarged prostate with lower urinary tract symptoms: Secondary | ICD-10-CM

## 2017-10-01 DIAGNOSIS — R350 Frequency of micturition: Secondary | ICD-10-CM

## 2017-10-01 DIAGNOSIS — Z1329 Encounter for screening for other suspected endocrine disorder: Secondary | ICD-10-CM

## 2017-10-01 DIAGNOSIS — Z136 Encounter for screening for cardiovascular disorders: Secondary | ICD-10-CM | POA: Diagnosis not present

## 2017-10-01 DIAGNOSIS — F909 Attention-deficit hyperactivity disorder, unspecified type: Secondary | ICD-10-CM

## 2017-10-01 DIAGNOSIS — I2510 Atherosclerotic heart disease of native coronary artery without angina pectoris: Secondary | ICD-10-CM

## 2017-10-01 DIAGNOSIS — E785 Hyperlipidemia, unspecified: Secondary | ICD-10-CM

## 2017-10-01 DIAGNOSIS — F17200 Nicotine dependence, unspecified, uncomplicated: Secondary | ICD-10-CM

## 2017-10-01 MED ORDER — VARENICLINE TARTRATE 1 MG PO TABS: 1.0000 mg | ORAL_TABLET | Freq: Two times a day (BID) | ORAL | 2 refills | Status: DC

## 2017-10-01 MED ORDER — AMPHETAMINE-DEXTROAMPHETAMINE 20 MG PO TABS: 20.0000 mg | ORAL_TABLET | Freq: Two times a day (BID) | ORAL | 0 refills | Status: DC

## 2017-10-01 MED ORDER — METOPROLOL TARTRATE 25 MG PO TABS: 25.0000 mg | ORAL_TABLET | Freq: Two times a day (BID) | ORAL | 1 refills | Status: DC

## 2017-10-01 NOTE — Progress Notes (Signed)
Complete physical  Assessment and Plan:   Essential hypertension - continue medications, DASH diet, exercise and monitor at home. Call if greater than 130/80.  - CBC with Differential/Platelet - BASIC METABOLIC PANEL WITH GFR - Hepatic function panel - TSH   ASHD (arteriosclerotic heart disease) Control blood pressure, cholesterol, glucose, increase exercise.  Stop smoking   Hyperlipidemia -continue medications, check lipids, decrease fatty foods, increase activity.  - Lipid panel   Crohn's disease of ileum, unspecified complication ([place]) Continue GI follow up   Vitamin D deficiency - VITAMIN D 25 Hydroxy (Vit-D Deficiency, Fractures)   Medication management - Magnesium   Left renal mass A/p nephrectomy, follow up urology   ADD (attention deficit disorder) - amphetamine-dextroamphetamine (ADDERALL) 20 MG tablet; Take 1 tablet (20 mg total) by mouth 2 (two) times daily.  Dispense: 60 tablet; Refill: 0  Encounter for general adult medical examination with abnormal findings  Smoker Advised to quit smoking, will try this week, will send in chantix and do patches - had CXR recently - will try anoro samples rather than breo  Screening PSA (prostate specific antigen) -     PSA  Atherosclerosis of aorta (HCC) Control blood pressure, cholesterol, glucose, increase exercise.  -     EKG 12-Lead   Continue diet and meds as discussed. Further disposition pending results of labs. Future Appointments  Date Time Provider [place]  10/19/2017  9:00 AM Irene Shipper, MD [facility]  10/06/2018  9:00 AM Vicie Mutters, PA-C [id number] None    HPI 52 y.o. smoking whit male  presents for CPE and  3 month follow up with hypertension, hyperlipidemia, prediabetes, CAD s/p stent  and vitamin D.   His blood pressure has been controlled at home, today their BP is BP: 122/78  He does not workout but will shovel mulch. He denies chest pain, shortness of breath, dizziness.   He does not use breo daily due to yeast, will use albuterol 1-2 x a week.  He has a history of ASHD S/P Stenting in 2010.  He is on ASA.  Denies dyspnea, exertional chest pressure/discomfort and irregular heart beat.   He is on cholesterol medication and denies myalgias. His cholesterol is at goal. The cholesterol last visit was:   Lab Results  Component Value Date   CHOL 102 07/02/2017   HDL 31 (L) 07/02/2017   LDLCALC 51 07/02/2017   TRIG 121 07/02/2017   CHOLHDL 3.3 07/02/2017   He has been working on diet and exercise for prediabetes, and denies paresthesia of the feet, polydipsia, polyuria and visual disturbances. Last A1C in the office was:  Lab Results  Component Value Date   HGBA1C 5.6 10/01/2015  Patient is on Vitamin D supplement.  Crohn's follows with Dr. [name].  He also follows with Dr. Risa Grill for left renal mass found during crohn's evaluation, had left partial nephrectomy 01/17/2015 and had + renal cell carcinoma following with urology next month.  He continues to smoke but would like to quit. Has done patches in the past and will likely try that. Granddaughter is 63 months old, going to have heart surgery for likely VSD. He is on vacation and will try to stop smoking this week. Has tried chantix without any help in the past.  BMI is Body mass index is 30.3 kg/m., he is working on diet and exercise. Wt Readings from Last 3 Encounters:  10/01/17 208 lb 3.2 oz (94.4 kg)  07/02/17 206 lb 6.4 oz (93.6 kg)  06/08/17 209 lb (94.8 kg)    Current Medications:  Current Outpatient Medications on File Prior to Visit  Medication Sig Dispense Refill  . albuterol (PROAIR HFA) 108 (90 Base) MCG/ACT inhaler Inhale 2 puffs into the lungs every 6 (six) hours as needed for wheezing or shortness of breath. 18 g 3  . amphetamine-dextroamphetamine (ADDERALL) 20 MG tablet Take 1 tablet (20 mg total) by mouth 2 (two) times daily. 60 tablet 0  . atorvastatin (LIPITOR) 80 MG tablet Take 1 tablet  (80 mg total) by mouth daily. 90 tablet 0  . cetirizine (ZYRTEC) 10 MG tablet Take 10 mg by mouth daily as needed for allergies.     Marland Kitchen esomeprazole (NEXIUM) 40 MG capsule Take 1 capsule (40 mg total) by mouth 2 (two) times daily before a meal. 60 capsule 2  . fluticasone furoate-vilanterol (BREO ELLIPTA) 100-25 MCG/INH AEPB Inhale 1 puff into the lungs daily. Rinse mouth with water after each use 1 each 2  . lidocaine (LIDODERM) 5 % Place 1 patch onto the skin daily. Remove & Discard patch within 12 hours or as directed by MD 30 patch 3   Current Facility-Administered Medications on File Prior to Visit  Medication Dose Route Frequency Provider Last Rate Last Dose  . 0.9 %  sodium chloride infusion  500 mL Intravenous Continuous Irene Shipper, MD      . 0.9 %  sodium chloride infusion  500 mL Intravenous Once Irene Shipper, MD       Medical History:  Past Medical History:  Diagnosis Date  . ADD (attention deficit disorder)   . Allergy   . Appendicitis   . Arthritis   . ASHD (arteriosclerotic heart disease)   . Cancer ([place])   . Colitis   . Crohn's disease ([place])   . Eczema   . GERD (gastroesophageal reflux disease)   . Hyperlipidemia   . Hypertension   . Left renal mass   . Morbid obesity ([place])   . Myocardial infarction [facility])    status post RCA stenting in [facility] 2011  . Positive for macroalbuminuria    Allergies No Known Allergies  SURGICAL HISTORY He  has a past surgical history that includes Appendectomy; Coronary stent placement; Colon surgery; Robotic assited partial nephrectomy (Left, 01/17/2015); and Elbow surgery. FAMILY HISTORY His family history includes Crohn's disease in his paternal aunt and paternal uncle; Diabetes in his maternal grandmother. SOCIAL HISTORY He  reports that he has been smoking.  He has a 30.00 pack-year smoking history. He has never used smokeless tobacco. He reports that he drinks alcohol. He reports that he has current or past drug history.  Drug: Marijuana.   Immunization History  Administered Date(s) Administered  . Td 05/19/2009   Declines the influenza vaccine  Colonoscopy 05/2017 Dr. [name]- has follow up next month Ct AB pelvis 03/17/2014 CXR 06/2017 MRI AB 08/2014 Echo 12/2014  Dr. Sabra Heck eye, 2-3 months  Review of Systems:  Review of Systems  Constitutional: Negative.   HENT: Negative.   Eyes: Negative.   Respiratory: Positive for cough. Negative for hemoptysis, sputum production, shortness of breath and wheezing.   Cardiovascular: Negative.   Gastrointestinal: Negative.   Genitourinary: Negative.   Musculoskeletal: Negative.   Skin: Positive for rash.  Neurological: Negative.   Endo/Heme/Allergies: Negative.   Psychiatric/Behavioral: Negative.    Physical Exam: BP 122/78   Pulse 87   Temp (!) 97.5 F (36.4 C)   Resp 18   Ht 5' 9.5" (  1.765 m)   Wt 208 lb 3.2 oz (94.4 kg)   SpO2 97%   BMI 30.30 kg/m  Wt Readings from Last 3 Encounters:  10/01/17 208 lb 3.2 oz (94.4 kg)  07/02/17 206 lb 6.4 oz (93.6 kg)  06/08/17 209 lb (94.8 kg)   General Appearance: Well nourished, in no apparent distress. Eyes: PERRLA, EOMs, conjunctiva no swelling or erythema Sinuses: No Frontal/maxillary tenderness ENT/Mouth: Ext aud canals clear, TMs without erythema, bulging. No erythema, swelling, or exudate on post pharynx.  Tonsils not swollen or erythematous. Hearing normal.  Neck: Supple, thyroid normal.  Respiratory: Respiratory effort normal, BS equal bilaterally without rales, rhonchi, wheezing or stridor.  Cardio: RRR with no MRGs. Brisk peripheral pulses without edema.  Abdomen: Soft, + BS, obese,  Non tender, no guarding, rebound, hernias, masses. Lymphatics: Non tender without lymphadenopathy.  Musculoskeletal: Full ROM, 5/5 strength, Normal Skin: Several healed scars along AB. Warm, dry without rashes, lesions, ecchymosis.  Neuro: Cranial nerves intact. Normal muscle tone except some minor muscle wasting  lateral right hand with decrease extension of 3 lateral fingers, normal distal vascular, no cerebellar symptoms. Psych: Awake and oriented X 3, normal affect, Insight and Judgment appropriate.   EKG IRBBB, no ST changes, 1st degree Av block, rate 76 Aorta Scan defer  Vicie Mutters, PA-C 9:13 AM [facility]

## 2017-10-01 NOTE — Patient Instructions (Addendum)
[employer] society  [phone] for more information or for a free program for smoking cessation help.   You can call QUIT SMART 1-800-QUIT-NOW for free nicotine patches or replacement therapy- if they are out- keep calling   cancer center Can call for smoking cessation classes, [phone]  If you have a smart phone, please look up Smoke Free app, this will help you stay on track and give you information about money you have saved, life that you have gained back and a ton of more information.   We are giving you chantix for smoking cessation. You can do it! And we are here to help! You may have heard some scary side effects about chantix, the three most common I hear about are nausea, crazy dreams and depression.  However, I like for my patients to try to stay on 1/2 a tablet twice a day rather than one tablet twice a day as normally prescribed. This helps decrease the chances of side effects and helps save money by making a one month prescription last two months  Please start the prescription this way:  Start 1/2 tablet by mouth once daily after food with a full glass of water for 3 days Then do 1/2 tablet by mouth twice daily for 4 days. During this first week you can smoke, but try to stop after this week.  At this point we have several options: 1) continue on 1/2 tablet twice a day- which I encourage you to do. You can stay on this dose the rest of the time on the medication or if you still feel the need to smoke you can do one of the two options below. 2) do one tablet in the morning and 1/2 in the evening which helps decrease dreams. 3) do one tablet twice a day.   What if I miss a dose? If you miss a dose, take it as soon as you can. If it is almost time for your next dose, take only that dose. Do not take double or extra doses.  What should I watch for while using this medicine? Visit your doctor or health care professional for regular check ups. Ask for ongoing  advice and encouragement from your doctor or healthcare professional, friends, and family to help you quit. If you smoke while on this medication, quit again  Your mouth may get dry. Chewing sugarless gum or hard candy, and drinking plenty of water may help. Contact your doctor if the problem does not go away or is severe.  You may get drowsy or dizzy. Do not drive, use machinery, or do anything that needs mental alertness until you know how this medicine affects you. Do not stand or sit up quickly, especially if you are an older patient.   The use of this medicine may increase the chance of suicidal thoughts or actions. Pay special attention to how you are responding while on this medicine. Any worsening of mood, or thoughts of suicide or dying should be reported to your health care professional right away.  ADVANTAGES OF QUITTING SMOKING  Within 20 minutes, blood pressure decreases. Your pulse is at normal level.  After 8 hours, carbon monoxide levels in the blood return to normal. Your oxygen level increases.  After 24 hours, the chance of having a heart attack starts to decrease. Your breath, hair, and body stop smelling like smoke.  After 48 hours, damaged nerve endings begin to recover. Your sense of taste and smell improve.  After 72  hours, the body is virtually free of nicotine. Your bronchial tubes relax and breathing becomes easier.  After 2 to 12 weeks, lungs can hold more air. Exercise becomes easier and circulation improves.  After 1 year, the risk of coronary heart disease is cut in half.  After 5 years, the risk of stroke falls to the same as a nonsmoker.  After 10 years, the risk of lung cancer is cut in half and the risk of other cancers decreases significantly.  After 15 years, the risk of coronary heart disease drops, usually to the level of a nonsmoker.  You will have extra money to spend on things other than cigarettes.  Check out  Mini habits for weight loss  book  2 apps for tracking food is myfitness pal  loseit OR can take picture of your food     When it comes to diets, agreement about the perfect plan isn't easy to find, even among the experts. Experts at the [employer] developed an idea known as the Healthy Eating Plate. Just imagine a plate divided into logical, healthy portions.  The emphasis is on diet quality:  Load up on vegetables and fruits - one-half of your plate: Aim for color and variety, and remember that potatoes don't count.  Go for whole grains - one-quarter of your plate: Whole wheat, barley, wheat berries, quinoa, oats, brown rice, and foods made with them. If you want pasta, go with whole wheat pasta.  Protein power - one-quarter of your plate: Fish, chicken, beans, and nuts are all healthy, versatile protein sources. Limit red meat.  The diet, however, does go beyond the plate, offering a few other suggestions.  Use healthy plant oils, such as olive, canola, soy, corn, sunflower and peanut. Check the labels, and avoid partially hydrogenated oil, which have unhealthy trans fats.  If you're thirsty, drink water. Coffee and tea are good in moderation, but skip sugary drinks and limit milk and dairy products to one or two daily servings.  The type of carbohydrate in the diet is more important than the amount. Some sources of carbohydrates, such as vegetables, fruits, whole grains, and beans-are healthier than others.  Finally, stay active.

## 2017-10-02 LAB — COMPLETE METABOLIC PANEL WITH GFR
AG Ratio: 1.3 (calc) (ref 1.0–2.5)
ALBUMIN MSPROF: 4 g/dL (ref 3.6–5.1)
ALKALINE PHOSPHATASE (APISO): 121 U/L — AB (ref 40–115)
ALT: 24 U/L (ref 9–46)
AST: 18 U/L (ref 10–35)
BUN: 17 mg/dL (ref 7–25)
CO2: 29 mmol/L (ref 20–32)
CREATININE: 0.92 mg/dL (ref 0.70–1.33)
Calcium: 9.4 mg/dL (ref 8.6–10.3)
Chloride: 106 mmol/L (ref 98–110)
GFR, Est African American: 110 mL/min/{1.73_m2} (ref >=60)
GFR, Est Non African American: 95 mL/min/{1.73_m2} (ref >=60)
GLUCOSE: 104 mg/dL — AB (ref 65–99)
Globulin: 3.1 g/dL (calc) (ref 1.9–3.7)
Potassium: 4.3 mmol/L (ref 3.5–5.3)
Sodium: 140 mmol/L (ref 135–146)
TOTAL PROTEIN: 7.1 g/dL (ref 6.1–8.1)
Total Bilirubin: 0.3 mg/dL (ref 0.2–1.2)

## 2017-10-02 LAB — URINALYSIS, ROUTINE W REFLEX MICROSCOPIC
Bilirubin Urine: NEGATIVE
Glucose, UA: NEGATIVE
HGB URINE DIPSTICK: NEGATIVE
Ketones, ur: NEGATIVE
LEUKOCYTES UA: NEGATIVE
NITRITE: NEGATIVE
Protein, ur: NEGATIVE
SPECIFIC GRAVITY, URINE: 1.015 (ref 1.001–1.03)
pH: 5.5 (ref 5.0–8.0)

## 2017-10-02 LAB — MICROALBUMIN / CREATININE URINE RATIO
Creatinine, Urine: 114 mg/dL (ref 20–320)
MICROALB UR: 4.8 mg/dL
MICROALB/CREAT RATIO: 42 ug/mg{creat} — AB (ref <30)

## 2017-10-02 LAB — PSA: PSA: 0.6 ng/mL (ref <=4.0)

## 2017-10-02 LAB — CBC WITH DIFFERENTIAL/PLATELET
BASOS PCT: 0.7 %
Basophils Absolute: 64 cells/uL (ref 0–200)
EOS PCT: 2.3 %
Eosinophils Absolute: 212 cells/uL (ref 15–500)
HCT: 40.5 % (ref 38.5–50.0)
HEMOGLOBIN: 13.7 g/dL (ref 13.2–17.1)
Lymphs Abs: 1343 cells/uL (ref 850–3900)
MCH: 28.5 pg (ref 27.0–33.0)
MCHC: 33.8 g/dL (ref 32.0–36.0)
MCV: 84.2 fL (ref 80.0–100.0)
MONOS PCT: 7.3 %
MPV: 10.3 fL (ref 7.5–12.5)
NEUTROS ABS: 6909 {cells}/uL (ref 1500–7800)
Neutrophils Relative %: 75.1 %
Platelets: 337 10*3/uL (ref 140–400)
RBC: 4.81 10*6/uL (ref 4.20–5.80)
RDW: 15.7 % — ABNORMAL HIGH (ref 11.0–15.0)
Total Lymphocyte: 14.6 %
WBC mixed population: 672 cells/uL (ref 200–950)
WBC: 9.2 10*3/uL (ref 3.8–10.8)

## 2017-10-02 LAB — LIPID PANEL
Cholesterol: 108 mg/dL (ref <200)
HDL: 36 mg/dL — AB (ref >40)
LDL Cholesterol (Calc): 52 mg/dL (calc)
Non-HDL Cholesterol (Calc): 72 mg/dL (calc) (ref <130)
TRIGLYCERIDES: 110 mg/dL (ref <150)
Total CHOL/HDL Ratio: 3 (calc) (ref <5.0)

## 2017-10-02 LAB — TSH: TSH: 1.13 m[IU]/L (ref 0.40–4.50)

## 2017-10-02 LAB — VITAMIN D 25 HYDROXY (VIT D DEFICIENCY, FRACTURES): Vit D, 25-Hydroxy: 37 ng/mL (ref 30–100)

## 2017-10-02 LAB — MAGNESIUM: MAGNESIUM: 2 mg/dL (ref 1.5–2.5)

## 2017-10-14 DIAGNOSIS — S30860A Insect bite (nonvenomous) of lower back and pelvis, initial encounter: Secondary | ICD-10-CM | POA: Diagnosis not present

## 2017-10-14 DIAGNOSIS — W57XXXA Bitten or stung by nonvenomous insect and other nonvenomous arthropods, initial encounter: Secondary | ICD-10-CM | POA: Diagnosis not present

## 2017-10-14 DIAGNOSIS — L309 Dermatitis, unspecified: Secondary | ICD-10-CM | POA: Diagnosis not present

## 2017-10-19 ENCOUNTER — Ambulatory Visit: Payer: Federal, State, Local not specified - PPO | Admitting: Internal Medicine

## 2017-10-19 ENCOUNTER — Encounter: Payer: Self-pay | Admitting: Internal Medicine

## 2017-10-19 VITALS — BP 110/60 | HR 68 | Ht 69.0 in | Wt 207.0 lb

## 2017-10-19 DIAGNOSIS — K921 Melena: Secondary | ICD-10-CM | POA: Diagnosis not present

## 2017-10-19 DIAGNOSIS — D509 Iron deficiency anemia, unspecified: Secondary | ICD-10-CM

## 2017-10-19 DIAGNOSIS — K50919 Crohn's disease, unspecified, with unspecified complications: Secondary | ICD-10-CM | POA: Diagnosis not present

## 2017-10-19 DIAGNOSIS — K219 Gastro-esophageal reflux disease without esophagitis: Secondary | ICD-10-CM | POA: Diagnosis not present

## 2017-10-19 LAB — HM DIABETES EYE EXAM

## 2017-10-19 MED ORDER — FERROUS SULFATE 325 (65 FE) MG PO TABS: 325.0000 mg | ORAL_TABLET | Freq: Two times a day (BID) | ORAL | 11 refills | Status: DC

## 2017-10-19 MED ORDER — ESOMEPRAZOLE MAGNESIUM 40 MG PO CPDR: 40.0000 mg | DELAYED_RELEASE_CAPSULE | Freq: Two times a day (BID) | ORAL | 11 refills | Status: DC

## 2017-10-19 NOTE — Progress Notes (Signed)
HISTORY OF PRESENT ILLNESS:  Cole [name] is a 52 y.o. male with ileal Crohn's disease status post ileocecectomy in [place] August 2011 when he presented with suppurative appendicitis with associated phlegmon. The pathology revealed changes consistent with Crohn's disease. I saw him initially 01/09/2014 for chronic diarrhea and the need for colonoscopy. Colonoscopy was performed 02/20/2014. The anastomosis was stenotic and ulcerated. Biopsies were consistent with inflammatory bowel disease. Subsequent small bowel follow-through revealed changes consistent with Crohn's disease involving the terminal ileum (5 cm segment). Subsequent CT scan revealed some inflammation at the level of the anastomosis. Otherwise unremarkable. Suboptimal follow-up. I saw the patient August 2017. He continued to opt for active surveillance. Subsequently seen by the GI nurse practitioner December 2018 regarding iron deficiency anemia with hemoglobin 9.7. Upper endoscopy was performed 05/07/2017. This was normal except for incidental esophageal ring. He subsequently underwent complete colonoscopy 06/08/2017. He was found to have stenosis and ulceration at the level of the anastomosis as previous. No ileal intubation. He was placed on iron supplementation. He continues on iron twice daily. Most recent hemoglobin from 10/01/2017 was normal at 13.7. He is tolerating iron well. He does complain of low back pain which he wonders might be secondary to iron. I told him it was not. Otherwise doing well. No diarrhea. No abdominal pain. No bleeding.patient does have chronic GERD for which he takes Nexium daily. He needs this medication to control symptoms. Despite this he will experience some breakthrough with regurgitation at night  REVIEW OF SYSTEMS:  All non-GI ROS negative except for back pain  Past Medical History:  Diagnosis Date  . ADD (attention deficit disorder)   . Allergy   . Appendicitis   . Arthritis   .  ASHD (arteriosclerotic heart disease)   . Cancer ([place])   . Colitis   . Crohn's disease ([place])   . Eczema   . GERD (gastroesophageal reflux disease)   . Hyperlipidemia   . Hypertension   . Left renal mass   . Morbid obesity ([place])   . Myocardial infarction [facility])    status post RCA stenting in [facility] 2011  . Positive for macroalbuminuria     Past Surgical History:  Procedure Laterality Date  . APPENDECTOMY    . COLON SURGERY    . CORONARY STENT PLACEMENT     x 1  . ELBOW SURGERY    . ROBOTIC ASSITED PARTIAL NEPHRECTOMY Left 01/17/2015   Procedure: ROBOTIC ASSITED LAPAROSCOPIC LEFT  PARTIAL  NEPHRECTOMY;  Surgeon: Ardis Hughs, MD;  Location: [facility];  Service: Urology;  Laterality: Left;    Social History Zahi Plaskett  reports that he has been smoking.  He has a 30.00 pack-year smoking history. He has never used smokeless tobacco. He reports that he drinks alcohol. He reports that he has current or past drug history. Drug: Marijuana.  family history includes Crohn's disease in his paternal aunt and paternal uncle; Diabetes in his maternal grandmother.  No Known Allergies     PHYSICAL EXAMINATION: Vital signs: BP 110/60   Pulse 68   Ht 5' 9"  (1.753 m)   Wt 207 lb (93.9 kg)   BMI 30.57 kg/m   Constitutional: generally well-appearing, no acute distress Psychiatric: alert and oriented x3, cooperative Eyes: extraocular movements intact, anicteric, conjunctiva pink Mouth: oral pharynx moist, no lesions Neck: supple no lymphadenopathy Cardiovascular: heart regular rate and rhythm, no murmur Lungs: clear to auscultation bilaterally Abdomen: soft, nontender, nondistended, no obvious ascites, no peritoneal  signs, normal bowel sounds, no organomegaly Rectal:omitted Extremities: no clubbing, cyanosis, or lower extremity edema bilaterally Skin: no lesions on visible extremities Neuro: No focal deficits. Cranial nerves intact  ASSESSMENT:  #1. Ileal Crohn's status  post ileocecectomy elsewhere 2011. Noted in short segment of active disease on prior postsurgical workup. Patient opting for ongoing active surveillance despite the fact that he had iron deficiency anemia related to his disease #2. Iron deficiency anemia related to ileal Crohn's and anastomotic ulceration. Good response to iron. Recent colonoscopy and upper endoscopy as described #3. GERD requiring PPI for symptom relief. Some breakthrough symptoms and regurgitation  PLAN:  #1. Continue iron twice daily indefinitely #2. Continue PPI for control of reflux symptoms #3. Reflux precautions reviewed #4. Routine office follow-up one year. Sooner if needed; resume general medical care with PCP

## 2017-10-19 NOTE — Patient Instructions (Signed)
We have sent the following medications to your pharmacy for you to pick up at your convenience:  Nexium, Iron  Continue taking Iron twice a day  Continue taking PPI  Please follow up in one year

## 2017-10-30 ENCOUNTER — Encounter: Payer: Self-pay | Admitting: *Deleted

## 2017-11-02 DIAGNOSIS — N281 Cyst of kidney, acquired: Secondary | ICD-10-CM | POA: Diagnosis not present

## 2017-11-02 DIAGNOSIS — Z85528 Personal history of other malignant neoplasm of kidney: Secondary | ICD-10-CM | POA: Diagnosis not present

## 2017-11-02 DIAGNOSIS — M549 Dorsalgia, unspecified: Secondary | ICD-10-CM | POA: Diagnosis not present

## 2018-01-12 ENCOUNTER — Telehealth: Payer: Self-pay | Admitting: Physician Assistant

## 2018-01-12 ENCOUNTER — Other Ambulatory Visit: Payer: Self-pay

## 2018-01-12 DIAGNOSIS — I10 Essential (primary) hypertension: Secondary | ICD-10-CM

## 2018-01-12 DIAGNOSIS — F909 Attention-deficit hyperactivity disorder, unspecified type: Secondary | ICD-10-CM

## 2018-01-12 DIAGNOSIS — R001 Bradycardia, unspecified: Secondary | ICD-10-CM

## 2018-01-12 DIAGNOSIS — I2510 Atherosclerotic heart disease of native coronary artery without angina pectoris: Secondary | ICD-10-CM

## 2018-01-12 DIAGNOSIS — E785 Hyperlipidemia, unspecified: Secondary | ICD-10-CM

## 2018-01-12 MED ORDER — ATORVASTATIN CALCIUM 80 MG PO TABS: 80.0000 mg | ORAL_TABLET | Freq: Every day | ORAL | 0 refills | Status: DC

## 2018-01-12 MED ORDER — AMPHETAMINE-DEXTROAMPHETAMINE 20 MG PO TABS: 20.0000 mg | ORAL_TABLET | Freq: Two times a day (BID) | ORAL | 0 refills | Status: DC

## 2018-01-12 MED ORDER — METOPROLOL TARTRATE 25 MG PO TABS: 25.0000 mg | ORAL_TABLET | Freq: Two times a day (BID) | ORAL | 1 refills | Status: DC

## 2018-01-12 NOTE — Telephone Encounter (Signed)
-----   Message from Elenor Quinones, [place] sent at 01/12/2018 10:20 AM EDT ----- Regarding: REFILL PER PT/YELLOW NOTE:  Refill on ADDERALL Please & thank you!

## 2018-03-29 NOTE — Progress Notes (Deleted)
Assessment and Plan:  Hypertension -Continue medication, monitor blood pressure at home. Continue DASH diet.  Reminder to go to the ER if any CP, SOB, nausea, dizziness, severe HA, changes vision/speech, left arm numbness and tingling and jaw pain.  Cholesterol -Continue diet and exercise. Check cholesterol.   Vitamin D Def - check level and continue medications.   ADD Printed out, taking appropriately  Smoking cessation -  commended patient for wanting to quit and reviewed strategies for preventing relapses  Cough Will repeat XRAY since smoker to assure resolution, get on breo.   Continue diet and meds as discussed. Further disposition pending results of labs. Over 30 minutes of exam, counseling, chart review, and critical decision making was performed Future Appointments  Date Time Provider [place]  04/02/2018  9:00 AM Vicie Mutters, PA-C [facility] None  10/06/2018  9:00 AM Vicie Mutters, PA-C [facility] None    HPI 52 y.o. male  presents for 3 month follow up on hypertension, cholesterol, prediabetes, and vitamin D deficiency.   He is on nexium BID for stomach ulcer, following with Dr. [name] has follow up.  He was seen for pneumonia in the ER 01/08, will repeat xray to assure resolution, patient is still coughing, still smoking. He has not been doing breo.    His blood pressure has been controlled at home, today their BP is    He has ASHD history with stent in 2010, on bASA, continue to smoke.  Had left partial nephrectomy 63/1497, no complications since that time. Passed recent kidney stone, saw urology.   He does not workout. He denies chest pain, shortness of breath, dizziness. He takes adderall just during the week, 1-2 x a day, and only 3-4 days a week.   He is on cholesterol medication and denies myalgias. His cholesterol is at goal. The cholesterol last visit was:   Lab Results  Component Value Date   CHOL 108 10/01/2017   HDL 36 (L) 10/01/2017   LDLCALC 52 10/01/2017   TRIG 110 10/01/2017   CHOLHDL 3.0 10/01/2017    He has been working on diet and exercise for prediabetes, and denies paresthesia of the feet, polydipsia, polyuria and visual disturbances. Last A1C in the office was:  Lab Results  Component Value Date   HGBA1C 5.6 10/01/2015   Patient is on Vitamin D supplement.   Lab Results  Component Value Date   VD25OH 37 10/01/2017     Still following Dr. Nelva Bush for his right ulnar neuropathy, has had nerve damage.  BMI is There is no height or weight on file to calculate BMI., he is working on diet and exercise. Wt Readings from Last 3 Encounters:  10/19/17 207 lb (93.9 kg)  10/01/17 208 lb 3.2 oz (94.4 kg)  07/02/17 206 lb 6.4 oz (93.6 kg)    Current Medications:  Current Outpatient Medications on File Prior to Visit  Medication Sig Dispense Refill  . albuterol (PROAIR HFA) 108 (90 Base) MCG/ACT inhaler Inhale 2 puffs into the lungs every 6 (six) hours as needed for wheezing or shortness of breath. 18 g 3  . amphetamine-dextroamphetamine (ADDERALL) 20 MG tablet Take 1 tablet (20 mg total) by mouth 2 (two) times daily. 60 tablet 0  . atorvastatin (LIPITOR) 80 MG tablet Take 1 tablet (80 mg total) by mouth daily. 90 tablet 0  . cetirizine (ZYRTEC) 10 MG tablet Take 10 mg by mouth daily as needed for allergies.     Marland Kitchen esomeprazole (NEXIUM) 40 MG capsule Take  1 capsule (40 mg total) by mouth 2 (two) times daily before a meal. 60 capsule 11  . ferrous sulfate 325 (65 FE) MG tablet Take 1 tablet (325 mg total) by mouth 2 (two) times daily. 60 tablet 11  . fluticasone furoate-vilanterol (BREO ELLIPTA) 100-25 MCG/INH AEPB Inhale 1 puff into the lungs daily. Rinse mouth with water after each use 1 each 2  . lidocaine (LIDODERM) 5 % Place 1 patch onto the skin daily. Remove & Discard patch within 12 hours or as directed by MD 30 patch 3  . metoprolol tartrate (LOPRESSOR) 25 MG tablet Take 1 tablet (25 mg total) by mouth 2 (two)  times daily. 180 tablet 1  . varenicline (CHANTIX CONTINUING MONTH PAK) 1 MG tablet Take 1 tablet (1 mg total) by mouth 2 (two) times daily. 56 tablet 2   Current Facility-Administered Medications on File Prior to Visit  Medication Dose Route Frequency Provider Last Rate Last Dose  . 0.9 %  sodium chloride infusion  500 mL Intravenous Continuous Irene Shipper, MD      . 0.9 %  sodium chloride infusion  500 mL Intravenous Once Irene Shipper, MD       Medical History:  Past Medical History:  Diagnosis Date  . ADD (attention deficit disorder)   . Allergy   . Appendicitis   . Arthritis   . ASHD (arteriosclerotic heart disease)   . Cancer ([place])   . Colitis   . Crohn's disease ([place])   . Eczema   . GERD (gastroesophageal reflux disease)   . Hyperlipidemia   . Hypertension   . Left renal mass   . Morbid obesity ([place])   . Myocardial infarction [facility])    status post RCA stenting in [facility] 2011  . Positive for macroalbuminuria    Allergies: No Known Allergies   Review of Systems:  Review of Systems  Constitutional: Negative for chills, diaphoresis, fever and malaise/fatigue.  HENT: Negative for congestion and sore throat.   Eyes: Negative.   Respiratory: Negative for cough, hemoptysis, sputum production, shortness of breath and wheezing.   Cardiovascular: Negative.  Negative for chest pain and leg swelling.  Gastrointestinal: Negative.   Genitourinary: Negative.   Musculoskeletal: Negative for neck pain.  Neurological: Negative.   Psychiatric/Behavioral: Negative.     Family history- Review and unchanged Social history- Review and unchanged Physical Exam: There were no vitals taken for this visit. Wt Readings from Last 3 Encounters:  10/19/17 207 lb (93.9 kg)  10/01/17 208 lb 3.2 oz (94.4 kg)  07/02/17 206 lb 6.4 oz (93.6 kg)   General Appearance: Well nourished, in no apparent distress. Eyes: PERRLA, EOMs, conjunctiva with hemorrhage right eye, no swelling, soft globe.   Sinuses: + Frontal/maxillary tenderness ENT/Mouth: Ext aud canals clear, TMs without erythema, bulging. No erythema, swelling, or exudate on post pharynx.  Tonsils not swollen or erythematous. Hearing normal.  Neck: Supple, thyroid normal.  Respiratory: Respiratory effort normal, BS equal bilaterally with wheezing without rales, rhonchi, or stridor.  Cardio: RRR with no MRGs. Brisk peripheral pulses without edema.  Abdomen: Soft, + BS,  Non tender, no guarding, rebound, hernias, masses. Lymphatics: Non tender without lymphadenopathy.  Musculoskeletal: Full ROM, 5/5 strength, Normal gait, right hand in soft cast Skin: Warm, dry without rashes, lesions, ecchymosis.  Neuro: Cranial nerves intact. Normal muscle tone, no cerebellar symptoms. Psych: Awake and oriented X 3, normal affect, Insight and Judgment appropriate.    Vicie Mutters, PA-C 12:58 PM [facility]  [facility]ult [facility]oles[facility]

## 2018-04-02 ENCOUNTER — Ambulatory Visit: Payer: Self-pay | Admitting: Physician Assistant

## 2018-04-06 ENCOUNTER — Ambulatory Visit: Payer: Federal, State, Local not specified - PPO | Admitting: Adult Health Nurse Practitioner

## 2018-04-06 ENCOUNTER — Encounter: Payer: Self-pay | Admitting: Adult Health Nurse Practitioner

## 2018-04-06 ENCOUNTER — Other Ambulatory Visit: Payer: Self-pay | Admitting: Urology

## 2018-04-06 ENCOUNTER — Ambulatory Visit (HOSPITAL_COMMUNITY)
Admission: RE | Admit: 2018-04-06 | Discharge: 2018-04-06 | Disposition: A | Discharge status: Home / Self Care | Payer: Federal, State, Local not specified - PPO | Source: Ambulatory Visit | Attending: Urology | Admitting: Urology

## 2018-04-06 VITALS — BP 120/82 | HR 75 | Temp 97.3°F | Ht 69.0 in | Wt 210.0 lb

## 2018-04-06 DIAGNOSIS — C642 Malignant neoplasm of left kidney, except renal pelvis: Secondary | ICD-10-CM | POA: Diagnosis not present

## 2018-04-06 DIAGNOSIS — F988 Other specified behavioral and emotional disorders with onset usually occurring in childhood and adolescence: Secondary | ICD-10-CM | POA: Diagnosis not present

## 2018-04-06 DIAGNOSIS — N200 Calculus of kidney: Secondary | ICD-10-CM | POA: Diagnosis not present

## 2018-04-06 DIAGNOSIS — I700 Atherosclerosis of aorta: Secondary | ICD-10-CM

## 2018-04-06 DIAGNOSIS — F17200 Nicotine dependence, unspecified, uncomplicated: Secondary | ICD-10-CM | POA: Diagnosis not present

## 2018-04-06 DIAGNOSIS — F909 Attention-deficit hyperactivity disorder, unspecified type: Secondary | ICD-10-CM

## 2018-04-06 DIAGNOSIS — D509 Iron deficiency anemia, unspecified: Secondary | ICD-10-CM | POA: Insufficient documentation

## 2018-04-06 DIAGNOSIS — K50019 Crohn's disease of small intestine with unspecified complications: Secondary | ICD-10-CM | POA: Diagnosis not present

## 2018-04-06 DIAGNOSIS — J069 Acute upper respiratory infection, unspecified: Secondary | ICD-10-CM

## 2018-04-06 DIAGNOSIS — Z79899 Other long term (current) drug therapy: Secondary | ICD-10-CM

## 2018-04-06 DIAGNOSIS — I10 Essential (primary) hypertension: Secondary | ICD-10-CM

## 2018-04-06 DIAGNOSIS — E785 Hyperlipidemia, unspecified: Secondary | ICD-10-CM | POA: Diagnosis not present

## 2018-04-06 DIAGNOSIS — I2510 Atherosclerotic heart disease of native coronary artery without angina pectoris: Secondary | ICD-10-CM

## 2018-04-06 DIAGNOSIS — E559 Vitamin D deficiency, unspecified: Secondary | ICD-10-CM

## 2018-04-06 MED ORDER — ATORVASTATIN CALCIUM 80 MG PO TABS: 80.0000 mg | ORAL_TABLET | Freq: Every day | ORAL | 1 refills | Status: DC

## 2018-04-06 MED ORDER — AMPHETAMINE-DEXTROAMPHETAMINE 20 MG PO TABS: 20.0000 mg | ORAL_TABLET | Freq: Two times a day (BID) | ORAL | 0 refills | Status: DC

## 2018-04-06 MED ORDER — AZITHROMYCIN 250 MG PO TABS: 250.0000 mg | ORAL_TABLET | Freq: Every day | ORAL | 0 refills | Status: AC

## 2018-04-06 MED ORDER — METOPROLOL TARTRATE 25 MG PO TABS: 25.0000 mg | ORAL_TABLET | Freq: Two times a day (BID) | ORAL | 1 refills | Status: DC

## 2018-04-06 NOTE — Patient Instructions (Addendum)
For Nasal congestion try Flonase daily Use nasal saline spray  Try changing allergy medication to Allegra  May try nasal decongestants, short term.  Mucinex D -[place]ange  Increase water intake  Will send in [place], start today   Follow up in 6 months   Preventive Care for Adults  A healthy lifestyle and preventive care can promote health and wellness. Preventive health guidelines for men include the following key practices:  A routine yearly physical is a good way to check with your health care provider about your health and preventative screening. It is a chance to share any concerns and updates on your health and to receive a thorough exam.  Visit your dentist for a routine exam and preventative care every 6 months. Brush your teeth twice a day and floss once a day. Good oral hygiene prevents tooth decay and gum disease.  The frequency of eye exams is based on your age, health, family medical history, use of contact lenses, and other factors. Follow your health care provider's recommendations for frequency of eye exams.  Eat a healthy diet. Foods such as vegetables, fruits, whole grains, low-fat dairy products, and lean protein foods contain the nutrients you need without too many calories. Decrease your intake of foods high in solid fats, added sugars, and salt. Eat the right amount of calories for you. Get information about a proper diet from your health care provider, if necessary.  Regular physical exercise is one of the most important things you can do for your health. Most adults should get at least 150 minutes of moderate-intensity exercise (any activity that increases your heart rate and causes you to sweat) each week. In addition, most adults need muscle-strengthening exercises on 2 or more days a week.  Maintain a healthy weight. The body mass index (BMI) is a screening tool to identify possible weight problems. It provides an estimate of body fat based on height and weight.  Your health care provider can find your BMI and can help you achieve or maintain a healthy weight. For adults 20 years and older:  A BMI below 18.5 is considered underweight.  A BMI of 18.5 to 24.9 is normal.  A BMI of 25 to 29.9 is considered overweight.  A BMI of 30 and above is considered obese.  Maintain normal blood lipids and cholesterol levels by exercising and minimizing your intake of saturated fat. Eat a balanced diet with plenty of fruit and vegetables. Blood tests for lipids and cholesterol should begin at age 14 and be repeated every 5 years. If your lipid or cholesterol levels are high, you are over 50, or you are at high risk for heart disease, you may need your cholesterol levels checked more frequently. Ongoing high lipid and cholesterol levels should be treated with medicines if diet and exercise are not working.  If you smoke, find out from your health care provider how to quit. If you do not use tobacco, do not start.  Lung cancer screening is recommended for adults aged 75-80 years who are at high risk for developing lung cancer because of a history of smoking. A yearly low-dose CT scan of the lungs is recommended for people who have at least a 30-pack-year history of smoking and are a current smoker or have quit within the past 15 years. A pack year of smoking is smoking an average of 1 pack of cigarettes a day for 1 year (for example: 1 pack a day for 30 years or 2 packs a  day for 15 years). Yearly screening should continue until the smoker has stopped smoking for at least 15 years. Yearly screening should be stopped for people who develop a health problem that would prevent them from having lung cancer treatment.  If you choose to drink alcohol, do not have more than 2 drinks per day. One drink is considered to be 12 ounces (355 mL) of beer, 5 ounces (148 mL) of wine, or 1.5 ounces (44 mL) of liquor.  Avoid use of street drugs. Do not share needles with anyone. Ask for help  if you need support or instructions about stopping the use of drugs.  High blood pressure causes heart disease and increases the risk of stroke. Your blood pressure should be checked at least every 1-2 years. Ongoing high blood pressure should be treated with medicines, if weight loss and exercise are not effective.  If you are 33-68 years old, ask your health care provider if you should take aspirin to prevent heart disease.  Diabetes screening involves taking a blood sample to check your fasting blood sugar level. This should be done once every 3 years, after age 55, if you are within normal weight and without risk factors for diabetes. Testing should be considered at a younger age or be carried out more frequently if you are overweight and have at least 1 risk factor for diabetes.  Colorectal cancer can be detected and often prevented. Most routine colorectal cancer screening begins at the age of 1 and continues through age 54. However, your health care provider may recommend screening at an earlier age if you have risk factors for colon cancer. On a yearly basis, your health care provider may provide home test kits to check for hidden blood in the stool. Use of a small camera at the end of a tube to directly examine the colon (sigmoidoscopy or colonoscopy) can detect the earliest forms of colorectal cancer. Talk to your health care provider about this at age 33, when routine screening begins. Direct exam of the colon should be repeated every 5-10 years through age 43, unless early forms of precancerous polyps or small growths are found.   Talk with your health care provider about prostate cancer screening.  Testicular cancer screening isrecommended for adult males. Screening includes self-exam, a health care provider exam, and other screening tests. Consult with your health care provider about any symptoms you have or any concerns you have about testicular cancer.  Use sunscreen. Apply sunscreen  liberally and repeatedly throughout the day. You should seek shade when your shadow is shorter than you. Protect yourself by wearing long sleeves, pants, a wide-brimmed hat, and sunglasses year round, whenever you are outdoors.  Once a month, do a whole-body skin exam, using a mirror to look at the skin on your back. Tell your health care provider about new moles, moles that have irregular borders, moles that are larger than a pencil eraser, or moles that have changed in shape or color.  Stay current with required vaccines (immunizations).  Influenza vaccine. All adults should be immunized every year.  Tetanus, diphtheria, and acellular pertussis (Td, Tdap) vaccine. An adult who has not previously received Tdap or who does not know his vaccine status should receive 1 dose of Tdap. This initial dose should be followed by tetanus and diphtheria toxoids (Td) booster doses every 10 years. Adults with an unknown or incomplete history of completing a 3-dose immunization series with Td-containing vaccines should begin or complete a primary immunization series  including a Tdap dose. Adults should receive a Td booster every 10 years.  Varicella vaccine. An adult without evidence of immunity to varicella should receive 2 doses or a second dose if he has previously received 1 dose.  Human papillomavirus (HPV) vaccine. Males aged 34-21 years who have not received the vaccine previously should receive the 3-dose series. Males aged 22-26 years may be immunized. Immunization is recommended through the age of 37 years for any male who has sex with males and did not get any or all doses earlier. Immunization is recommended for any person with an immunocompromised condition through the age of 23 years if he did not get any or all doses earlier. During the 3-dose series, the second dose should be obtained 4-8 weeks after the first dose. The third dose should be obtained 24 weeks after the first dose and 16 weeks after the  second dose.  Zoster vaccine. One dose is recommended for adults aged 35 years or older unless certain conditions are present.    PREVNAR  - Pneumococcal 13-valent conjugate (PCV13) vaccine. When indicated, a person who is uncertain of his immunization history and has no record of immunization should receive the PCV13 vaccine. An adult aged 12 years or older who has certain medical conditions and has not been previously immunized should receive 1 dose of PCV13 vaccine. This PCV13 should be followed with a dose of pneumococcal polysaccharide (PPSV23) vaccine. The PPSV23 vaccine dose should be obtained at least 1 r more year(s) after the dose of PCV13 vaccine. An adult aged 49 years or older who has certain medical conditions and previously received 1 or more doses of PPSV23 vaccine should receive 1 dose of PCV13. The PCV13 vaccine dose should be obtained 1 or more years after the last PPSV23 vaccine dose.    PNEUMOVAX - Pneumococcal polysaccharide (PPSV23) vaccine. When PCV13 is also indicated, PCV13 should be obtained first. All adults aged 34 years and older should be immunized. An adult younger than age 67 years who has certain medical conditions should be immunized. Any person who resides in a nursing home or long-term care facility should be immunized. An adult smoker should be immunized. People with an immunocompromised condition and certain other conditions should receive both PCV13 and PPSV23 vaccines. People with human immunodeficiency virus (HIV) infection should be immunized as soon as possible after diagnosis. Immunization during chemotherapy or radiation therapy should be avoided. Routine use of PPSV23 vaccine is not recommended for American Indians, [place] Natives, or people younger than 65 years unless there are medical conditions that require PPSV23 vaccine. When indicated, people who have unknown immunization and have no record of immunization should receive PPSV23 vaccine. One-time  revaccination 5 years after the first dose of PPSV23 is recommended for people aged 19-64 years who have chronic kidney failure, nephrotic syndrome, asplenia, or immunocompromised conditions. People who received 1-2 doses of PPSV23 before age 24 years should receive another dose of PPSV23 vaccine at age 67 years or later if at least 5 years have passed since the previous dose. Doses of PPSV23 are not needed for people immunized with PPSV23 at or after age 79 years.    Hepatitis A vaccine. Adults who wish to be protected from this disease, have certain high-risk conditions, work with hepatitis A-infected animals, work in hepatitis A research labs, or travel to or work in countries with a high rate of hepatitis A should be immunized. Adults who were previously unvaccinated and who anticipate close contact with an  international adoptee during the first 60 days after arrival in the [place] from a country with a high rate of hepatitis A should be immunized.    Hepatitis B vaccine. Adults should be immunized if they wish to be protected from this disease, have certain high-risk conditions, may be exposed to blood or other infectious body fluids, are household contacts or sex partners of hepatitis B positive people, are clients or workers in certain care facilities, or travel to or work in countries with a high rate of hepatitis B.   Preventive Service / Frequency   Ages 83 to 9  Blood pressure check.  Lipid and cholesterol check  Lung cancer screening. / Every year if you are aged 76-80 years and have a 30-pack-year history of smoking and currently smoke or have quit within the past 15 years. Yearly screening is stopped once you have quit smoking for at least 15 years or develop a health problem that would prevent you from having lung cancer treatment.  Fecal occult blood test (FOBT) of stool. / Every year beginning at age 88 and continuing until age 32. You may not have to do this test if you  get a colonoscopy every 10 years.  Flexible sigmoidoscopy** or colonoscopy.** / Every 5 years for a flexible sigmoidoscopy or every 10 years for a colonoscopy beginning at age 67 and continuing until age 19. Screening for abdominal aortic aneurysm (AAA)  by ultrasound is recommended for people who have history of high blood pressure or who are current or former smokers. +++++++++++ Recommend Adult Low Dose Aspirin or  coated  Aspirin 81 mg daily  To reduce risk of Colon Cancer 20 %,  Skin Cancer 26 % ,  Malignant Melanoma 46%  and  Pancreatic cancer 60% ++++++++++++++++++++ Vitamin D goal  is between 70-100.  Please make sure that you are taking your Vitamin D as directed.  It is very important as a natural anti-inflammatory  helping hair, skin, and nails, as well as reducing stroke and heart attack risk.  It helps your bones and helps with mood. It also decreases numerous cancer risks so please take it as directed.  Low Vit D is associated with a 200-300% higher risk for CANCER  and 200-300% higher risk for HEART   ATTACK  &  STROKE.   .....................................Marland Kitchen It is also associated with higher death rate at younger ages,  autoimmune diseases like Rheumatoid arthritis, Lupus, Multiple Sclerosis.    Also many other serious conditions, like depression, Alzheimer's Dementia, infertility, muscle aches, fatigue, fibromyalgia - just to name a few. +++++++++++++++++++++ Recommend the book "The END of DIETING" by Dr Excell Seltzer  & the book "The END of DIABETES " by Dr Excell Seltzer At [email or web address] - get book & Audio CD's    Being diabetic has a  300% increased risk for heart attack, stroke, cancer, and alzheimer- type vascular dementia. It is very important that you work harder with diet by avoiding all foods that are white. Avoid white rice (brown & wild rice is OK), white potatoes (sweetpotatoes in moderation is OK), White bread or wheat bread or anything made out of white flour  like bagels, donuts, rolls, buns, biscuits, cakes, pastries, cookies, pizza crust, and pasta (made from white flour & egg whites) - vegetarian pasta or spinach or wheat pasta is OK. Multigrain breads like Arnold's or Pepperidge Farm, or multigrain sandwich thins or flatbreads.  Diet, exercise and weight loss can reverse and cure diabetes in the early  stages.  Diet, exercise and weight loss is very important in the control and prevention of complications of diabetes which affects every system in your body, ie. Brain - dementia/stroke, eyes - glaucoma/blindness, heart - heart attack/heart failure, kidneys - dialysis, stomach - gastric paralysis, intestines - malabsorption, nerves - severe painful neuritis, circulation - gangrene & loss of a leg(s), and finally cancer and Alzheimers.    I recommend avoid fried & greasy foods,  sweets/candy, white rice (brown or wild rice or Quinoa is OK), white potatoes (sweet potatoes are OK) - anything made from white flour - bagels, doughnuts, rolls, buns, biscuits,white and wheat breads, pizza crust and traditional pasta made of white flour & egg white(vegetarian pasta or spinach or wheat pasta is OK).  Multi-grain bread is OK - like multi-grain flat bread or sandwich thins. Avoid alcohol in excess. Exercise is also important.    Eat all the vegetables you want - avoid meat, especially red meat and dairy - especially cheese.  Cheese is the most concentrated form of trans-fats which is the worst thing to clog up our arteries. Veggie cheese is OK which can be found in the fresh produce section at [employer] or [employer] or [employer]  ++++++++++++++++++++++ DASH Eating Plan  DASH stands for "Dietary Approaches to Stop Hypertension."   The DASH eating plan is a healthy eating plan that has been shown to reduce high blood pressure (hypertension). Additional health benefits may include reducing the risk of type 2 diabetes mellitus, heart disease, and stroke. The DASH  eating plan may also help with weight loss. WHAT DO I NEED TO KNOW ABOUT THE DASH EATING PLAN? For the DASH eating plan, you will follow these general guidelines:  Choose foods with a percent daily value for sodium of less than 5% (as listed on the food label).  Use salt-free seasonings or herbs instead of table salt or sea salt.  Check with your health care provider or pharmacist before using salt substitutes.  Eat lower-sodium products, often labeled as "lower sodium" or "no salt added."  Eat fresh foods.  Eat more vegetables, fruits, and low-fat dairy products.  Choose whole grains. Look for the word "whole" as the first word in the ingredient list.  Choose fish   Limit sweets, desserts, sugars, and sugary drinks.  Choose heart-healthy fats.  Eat veggie cheese   Eat more home-cooked food and less restaurant, buffet, and fast food.  Limit fried foods.  Cook foods using methods other than frying.  Limit canned vegetables. If you do use them, rinse them well to decrease the sodium.  When eating at a restaurant, ask that your food be prepared with less salt, or no salt if possible.                      WHAT FOODS CAN I EAT? Read Dr Fara Olden Fuhrman's books on The End of Dieting & The End of Diabetes  Grains Whole grain or whole wheat bread. Brown rice. Whole grain or whole wheat pasta. Quinoa, bulgur, and whole grain cereals. Low-sodium cereals. Corn or whole wheat flour tortillas. Whole grain cornbread. Whole grain crackers. Low-sodium crackers.  Vegetables Fresh or frozen vegetables (raw, steamed, roasted, or grilled). Low-sodium or reduced-sodium tomato and vegetable juices. Low-sodium or reduced-sodium tomato sauce and paste. Low-sodium or reduced-sodium canned vegetables.   Fruits All fresh, canned (in natural juice), or frozen fruits.  Protein Products  All fish and seafood.  Dried beans, peas, or lentils. Unsalted  nuts and seeds. Unsalted canned  beans.  Dairy Low-fat dairy products, such as skim or 1% milk, 2% or reduced-fat cheeses, low-fat ricotta or cottage cheese, or plain low-fat yogurt. Low-sodium or reduced-sodium cheeses.  Fats and Oils Tub margarines without trans fats. Light or reduced-fat mayonnaise and salad dressings (reduced sodium). Avocado. Safflower, olive, or canola oils. Natural peanut or almond butter.  Other Unsalted popcorn and pretzels. The items listed above may not be a complete list of recommended foods or beverages. Contact your dietitian for more options.  +++++++++++++++++++  WHAT FOODS ARE NOT RECOMMENDED? Grains/ White flour or wheat flour White bread. White pasta. White rice. Refined cornbread. Bagels and croissants. Crackers that contain trans fat.  Vegetables  Creamed or fried vegetables. Vegetables in a . Regular canned vegetables. Regular canned tomato sauce and paste. Regular tomato and vegetable juices.  Fruits Dried fruits. Canned fruit in light or heavy syrup. Fruit juice.  Meat and Other Protein Products Meat in general - RED meat & White meat.  Fatty cuts of meat. Ribs, chicken wings, all processed meats as bacon, sausage, bologna, salami, fatback, hot dogs, bratwurst and packaged luncheon meats.  Dairy Whole or 2% milk, cream, half-and-half, and cream cheese. Whole-fat or sweetened yogurt. Full-fat cheeses or blue cheese. Non-dairy creamers and whipped toppings. Processed cheese, cheese spreads, or cheese curds.  Condiments Onion and garlic salt, seasoned salt, table salt, and sea salt. Canned and packaged gravies. Worcestershire sauce. Tartar sauce. Barbecue sauce. Teriyaki sauce. Soy sauce, including reduced sodium. Steak sauce. Fish sauce. Oyster sauce. Cocktail sauce. Horseradish. Ketchup and mustard. Meat flavorings and tenderizers. Bouillon cubes. Hot sauce. Tabasco sauce. Marinades. Taco seasonings. Relishes.  Fats and Oils Butter, stick margarine, lard, shortening and  bacon fat. Coconut, palm kernel, or palm oils. Regular salad dressings.  Pickles and olives. Salted popcorn and pretzels.  The items listed above may not be a complete list of foods and beverages to avoid.

## 2018-04-06 NOTE — Progress Notes (Signed)
FOLLOW UP  Assessment and Plan:  Cole [name] was seen today for follow-up.  Diagnoses and all orders for this visit:  Crohn's disease of ileum with complication ([place]) Doing well at this time, not on medications -     COMPLETE METABOLIC PANEL WITH GFR  Hyperlipidemia, unspecified hyperlipidemia type Continue dietary and exercise modifications -     Lipid panel -     atorvastatin (LIPITOR) 80 MG tablet; Take 1 tablet (80 mg total) by mouth daily.  Attention deficit disorder, unspecified hyperactivity presence  Doing well Continue Adderall 62m   Smoker - Not ready to quit at this time Will continue to assess readiness  Morbid obesity ([place] Discussed dietary and exercise modifications -     Hemoglobin A1c -     Insulin, random  Essential hypertension Doing well with this -     Magnesium -     Lipid panel -     metoprolol tartrate (LOPRESSOR) 25 MG tablet; Take 1 tablet (25 mg total) by mouth 2 (two) times daily.  Atherosclerosis of aorta (HCC) Will check lipids  Chronic iron deficiency anemia Taking supplements and doing well Will check TIBC  URI with cough and congestion Will treat based on duration and increased risk with smoking -     azithromycin (ZITHROMAX) 250 MG tablet; Take 1 tablet (250 mg total) by mouth daily for 5 doses. Take 2 tablets PO on day 1, then 1 tablet PO Q24H x 4 days Discussed supportive measure and provided educational handout Restart allergy medications   Vitamin D deficiency Not taking supplements at this time Discussed importance of this Provided educational information and goal for patient  ASHD (arteriosclerotic heart disease) -     atorvastatin (LIPITOR) 80 MG tablet; Take 1 tablet (80 mg total) by mouth daily.  Attention deficit hyperactivity disorder (ADHD), unspecified ADHD type -     amphetamine-dextroamphetamine (ADDERALL) 20 MG tablet; Take 1 tablet (20 mg total) by mouth 2 (two) times daily.  Medication management -      Magnesium -     Hemoglobin A1c -     Insulin, random -     VITAMIN D 25 Hydroxy (Vit-D Deficiency, Fractures)   Patient to follow up in 6 months for medication management and physical.  Will contact with lab results  Call or return with new or worsening symptoms    Continue diet and meds as discussed. Further disposition pending results of labs. Discussed med's effects and SE's.   Over 30 minutes of exam, counseling, chart review, and critical decision making was performed.   Future Appointments  Date Time Provider [place] 10/06/2018  9:00 AM CVicie Mutters PA-C [facility] None    ------------------------------------------------------ ----------------------------------------------------------------  HPI 52y.o. male  presents for 3 month follow up on hypertension, cholesterol, diabetes, weight and vitamin D deficiency.  Reports he has been doing well since last visit.  He has no concerns regarding his chronic medications and or conditions.  Reports that he has a head could that has been going on for two and a half weeks.  He is having some nasal congestion and cough that is semi productive.  He has not been taking medication for this.  He is a smoker, a pack a day and knows stopping would help his symptoms. Reports that he had blood work, chest Xray and CT of abdomen pelvis.  History of lesion on kidney they removed.    BMI is Body mass index is 31.01 kg/m., he has not  been working on diet and exercise. Wt Readings from Last 3 Encounters:  04/06/18 210 lb (95.3 kg)  10/19/17 207 lb (93.9 kg)  10/01/17 208 lb 3.2 oz (94.4 kg)    His blood pressure has been controlled at home, today their BP is BP: 120/82  He does not workout. He denies chest pain, shortness of breath, dizziness.   He is on cholesterol medication Atorvastatin and denies myalgias. His cholesterol is at goal. His good cholesterol is low and not at goal. The cholesterol last visit was:   Lab Results   Component Value Date   CHOL 110 04/06/2018   HDL 29 (L) 04/06/2018   LDLCALC 59 04/06/2018   TRIG 141 04/06/2018   CHOLHDL 3.8 04/06/2018    He has not been working on diet and exercise for prediabetes, and denies hyperglycemia, hypoglycemia , polydipsia and polyuria. Last A1C in the office was:  Lab Results  Component Value Date   HGBA1C 5.6 04/06/2018   Patient is on Vitamin D supplement.   Lab Results  Component Value Date   VD25OH 31 04/06/2018        Current Medications:  Current Outpatient Medications on File Prior to Visit  Medication Sig  . albuterol (PROAIR HFA) 108 (90 Base) MCG/ACT inhaler Inhale 2 puffs into the lungs every 6 (six) hours as needed for wheezing or shortness of breath.  . cetirizine (ZYRTEC) 10 MG tablet Take 10 mg by mouth daily as needed for allergies.   Marland Kitchen esomeprazole (NEXIUM) 40 MG capsule Take 1 capsule (40 mg total) by mouth 2 (two) times daily before a meal.  . ferrous sulfate 325 (65 FE) MG tablet Take 1 tablet (325 mg total) by mouth 2 (two) times daily.  . fluticasone furoate-vilanterol (BREO ELLIPTA) 100-25 MCG/INH AEPB Inhale 1 puff into the lungs daily. Rinse mouth with water after each use  . lidocaine (LIDODERM) 5 % Place 1 patch onto the skin daily. Remove & Discard patch within 12 hours or as directed by MD  . varenicline (CHANTIX CONTINUING MONTH PAK) 1 MG tablet Take 1 tablet (1 mg total) by mouth 2 (two) times daily. (Patient not taking: Reported on 04/06/2018)   Current Facility-Administered Medications on File Prior to Visit  Medication  . 0.9 %  sodium chloride infusion  . 0.9 %  sodium chloride infusion     Allergies: No Known Allergies   Medical History:  Past Medical History:  Diagnosis Date  . ADD (attention deficit disorder)   . Allergy   . Appendicitis   . Arthritis   . ASHD (arteriosclerotic heart disease)   . Cancer ([place])   . Colitis   . Crohn's disease ([place])   . Eczema   . GERD (gastroesophageal reflux  disease)   . Hyperlipidemia   . Hypertension   . Left renal mass   . Morbid obesity ([place])   . Myocardial infarction [facility])    status post RCA stenting in [facility] 2011  . Positive for macroalbuminuria    Family history- Reviewed and unchanged Social history- Reviewed and unchanged   Review of Systems:  Review of Systems  Constitutional: Negative for chills, diaphoresis, fever, malaise/fatigue and weight loss.  HENT: Positive for congestion. Negative for ear discharge, ear pain, hearing loss, nosebleeds, sinus pain, sore throat and tinnitus.   Eyes: Negative for blurred vision, double vision, photophobia, pain, discharge and redness.  Respiratory: Positive for cough. Negative for hemoptysis, sputum production, shortness of breath, wheezing and stridor.  Cardiovascular: Negative for chest pain, palpitations, orthopnea, claudication, leg swelling and PND.  Gastrointestinal: Negative for abdominal pain, blood in stool, constipation, diarrhea, heartburn, melena, nausea and vomiting.  Genitourinary: Negative for dysuria, flank pain, frequency, hematuria and urgency.  Musculoskeletal: Negative for back pain, falls, joint pain, myalgias and neck pain.  Skin: Negative for itching and rash.  Neurological: Negative for dizziness, tingling, tremors, sensory change, speech change, focal weakness, seizures, loss of consciousness, weakness and headaches.  Endo/Heme/Allergies: Negative for environmental allergies and polydipsia. Does not bruise/bleed easily.  Psychiatric/Behavioral: Negative for depression, hallucinations, memory loss, substance abuse and suicidal ideas. The patient is not nervous/anxious and does not have insomnia.       Physical Exam: BP 120/82   Pulse 75   Temp (!) 97.3 F (36.3 C)   Ht 5' 9"  (1.753 m)   Wt 210 lb (95.3 kg)   SpO2 98%   BMI 31.01 kg/m  Wt Readings from Last 3 Encounters:  04/06/18 210 lb (95.3 kg)  10/19/17 207 lb (93.9 kg)  10/01/17 208 lb 3.2 oz  (94.4 kg)   General Appearance: Well nourished, in no apparent distress. Eyes: PERRLA, EOMs, conjunctiva no swelling or erythema Sinuses: No Frontal/maxillary tenderness ENT/Mouth: Ext aud canals clear, TMs without erythema, bulging. No erythema, swelling, or exudate on post pharynx.  Tonsils not swollen or erythematous. Hearing normal.  Neck: Supple, thyroid normal.  Respiratory: Respiratory effort normal, BS equal bilaterally without rales, rhonchi, wheezing or stridor.  Cardio: RRR with no MRGs. Brisk peripheral pulses without edema.  Abdomen: Soft, + BS.  Non tender, no guarding, rebound, hernias, masses. Lymphatics: Non tender without lymphadenopathy.  Musculoskeletal: Full ROM, 5/5 strength, Normal gait Skin: Warm, dry without rashes, lesions, ecchymosis.  Neuro: Cranial nerves intact. No cerebellar symptoms.  Psych: Awake and oriented X 3, normal affect, Insight and Judgment appropriate.    Names of Other Physician/Practitioners you currently use: 1. [facility] here for primary care 2   Eye Exam  04/06/2018 3. Dentist 2019   Patient Care Team: Unk Pinto, MD as PCP - General (Internal Medicine)    Screening Tests: Immunization History  Administered Date(s) Administered  . Td 05/19/2009    Preventative care: Last colonoscopy: 2019  Vaccinations: TD or Tdap:  2011  Influenza: Declined   Pneumococcal: N/A Prevnar13: Due, related to smoking status Shingles/Zostavax: Due discussed with patient    Garnet Sierras, NP 1:09 PM [facility]

## 2018-04-07 LAB — COMPLETE METABOLIC PANEL WITHOUT GFR
AG Ratio: 1.2 (calc) (ref 1.0–2.5)
ALT: 23 U/L (ref 9–46)
AST: 17 U/L (ref 10–35)
Alkaline phosphatase (APISO): 102 U/L (ref 40–115)
Chloride: 105 mmol/L (ref 98–110)
GFR, Est Non African American: 101 mL/min/1.73m2 (ref >=60)
Globulin: 3.3 g/dL (ref 1.9–3.7)
Potassium: 4.3 mmol/L (ref 3.5–5.3)
Sodium: 136 mmol/L (ref 135–146)
Total Bilirubin: 0.3 mg/dL (ref 0.2–1.2)

## 2018-04-07 LAB — LIPID PANEL
Cholesterol: 110 mg/dL (ref <200)
HDL: 29 mg/dL — ABNORMAL LOW (ref >40)
LDL Cholesterol (Calc): 59 mg/dL (calc)
Non-HDL Cholesterol (Calc): 81 mg/dL (calc) (ref <130)
Total CHOL/HDL Ratio: 3.8 (calc) (ref <5.0)
Triglycerides: 141 mg/dL (ref <150)

## 2018-04-07 LAB — VITAMIN D 25 HYDROXY (VIT D DEFICIENCY, FRACTURES): Vit D, 25-Hydroxy: 31 ng/mL (ref 30–100)

## 2018-04-07 LAB — COMPLETE METABOLIC PANEL WITH GFR
Albumin: 3.8 g/dL (ref 3.6–5.1)
BUN: 14 mg/dL (ref 7–25)
CO2: 26 mmol/L (ref 20–32)
Calcium: 8.8 mg/dL (ref 8.6–10.3)
Creat: 0.81 mg/dL (ref 0.70–1.33)
GFR, Est African American: 118 mL/min/{1.73_m2} (ref >=60)
Glucose, Bld: 65 mg/dL (ref 65–99)
Total Protein: 7.1 g/dL (ref 6.1–8.1)

## 2018-04-07 LAB — MAGNESIUM: Magnesium: 1.8 mg/dL (ref 1.5–2.5)

## 2018-04-07 LAB — HEMOGLOBIN A1C
Hgb A1c MFr Bld: 5.6 % of total Hgb (ref <5.7)
Mean Plasma Glucose: 114 (calc)
eAG (mmol/L): 6.3 (calc)

## 2018-04-07 LAB — INSULIN, RANDOM: Insulin: 5.2 u[IU]/mL (ref 2.0–19.6)

## 2018-04-23 DIAGNOSIS — Z85528 Personal history of other malignant neoplasm of kidney: Secondary | ICD-10-CM | POA: Diagnosis not present

## 2018-04-23 DIAGNOSIS — H35033 Hypertensive retinopathy, bilateral: Secondary | ICD-10-CM | POA: Diagnosis not present

## 2018-05-25 ENCOUNTER — Telehealth: Payer: Self-pay | Admitting: Physician Assistant

## 2018-05-25 DIAGNOSIS — F909 Attention-deficit hyperactivity disorder, unspecified type: Secondary | ICD-10-CM

## 2018-05-25 MED ORDER — AMPHETAMINE-DEXTROAMPHETAMINE 20 MG PO TABS: 20.0000 mg | ORAL_TABLET | Freq: Two times a day (BID) | ORAL | 0 refills | Status: DC

## 2018-05-25 NOTE — Telephone Encounter (Signed)
refill 

## 2018-05-25 NOTE — Telephone Encounter (Signed)
-----   Message from Elenor Quinones, [place] sent at 05/24/2018  4:40 PM EST ----- Regarding: med refill PER PT/YELLOW NOTE:  Refill on ADDERALL Please & thank you!  Pharmacy:  [facility] [employer]

## 2018-07-30 ENCOUNTER — Other Ambulatory Visit: Payer: Self-pay

## 2018-07-30 DIAGNOSIS — F909 Attention-deficit hyperactivity disorder, unspecified type: Secondary | ICD-10-CM

## 2018-07-30 NOTE — Telephone Encounter (Signed)
Refill request for Adderall. Last rx written on 05/25/18. In que for review.

## 2018-07-31 MED ORDER — AMPHETAMINE-DEXTROAMPHETAMINE 20 MG PO TABS: 20.0000 mg | ORAL_TABLET | Freq: Two times a day (BID) | ORAL | 0 refills | Status: DC

## 2018-09-30 NOTE — Progress Notes (Deleted)
Complete physical  Assessment and Plan:   Essential hypertension - continue medications, DASH diet, exercise and monitor at home. Call if greater than 130/80.  - CBC with Differential/Platelet - BASIC METABOLIC PANEL WITH GFR - Hepatic function panel - TSH   ASHD (arteriosclerotic heart disease) Control blood pressure, cholesterol, glucose, increase exercise.  Stop smoking   Hyperlipidemia -continue medications, check lipids, decrease fatty foods, increase activity.  - Lipid panel   Crohn's disease of ileum, unspecified complication ([place]) Continue GI follow up   Vitamin D deficiency - VITAMIN D 25 Hydroxy (Vit-D Deficiency, Fractures)   Medication management - Magnesium   Left renal mass A/p nephrectomy, follow up urology   ADD (attention deficit disorder) - amphetamine-dextroamphetamine (ADDERALL) 20 MG tablet; Take 1 tablet (20 mg total) by mouth 2 (two) times daily.  Dispense: 60 tablet; Refill: 0  Encounter for general adult medical examination with abnormal findings  Smoker Advised to quit smoking, will try this week, will send in chantix and do patches - had CXR recently - will try anoro samples rather than breo  Screening PSA (prostate specific antigen) -     PSA  Atherosclerosis of aorta (HCC) Control blood pressure, cholesterol, glucose, increase exercise.  -     EKG 12-Lead   Continue diet and meds as discussed. Further disposition pending results of labs. Future Appointments  Date Time Provider [place]  10/06/2018  9:00 AM Vicie Mutters, PA-C [facility] None    HPI 53 y.o. smoking whit male  presents for CPE and  3 month follow up with hypertension, hyperlipidemia, prediabetes, CAD s/p stent  and vitamin D.   His blood pressure has been controlled at home, today their BP is    He does not workout but will shovel mulch. He denies chest pain, shortness of breath, dizziness.  He does not use breo daily due to yeast, will use albuterol 1-2  x a week.  He has a history of ASHD S/P Stenting in 2010.  He is on ASA.  Denies dyspnea, exertional chest pressure/discomfort and irregular heart beat.   He is on cholesterol medication and denies myalgias. His cholesterol is at goal. The cholesterol last visit was:   Lab Results  Component Value Date   CHOL 110 04/06/2018   HDL 29 (L) 04/06/2018   LDLCALC 59 04/06/2018   TRIG 141 04/06/2018   CHOLHDL 3.8 04/06/2018   He has been working on diet and exercise for prediabetes, and denies paresthesia of the feet, polydipsia, polyuria and visual disturbances. Last A1C in the office was:  Lab Results  Component Value Date   HGBA1C 5.6 04/06/2018  Patient is on Vitamin D supplement.  Crohn's follows with Dr. [name].  He also follows with Dr. Risa Grill for left renal mass found during crohn's evaluation, had left partial nephrectomy 01/17/2015 and had + renal cell carcinoma following with urology next month.  He continues to smoke but would like to quit. Has done patches in the past and will likely try that. Granddaughter is 14 months old, going to have heart surgery for likely VSD. He is on vacation and will try to stop smoking this week. Has tried chantix without any help in the past.  BMI is There is no height or weight on file to calculate BMI., he is working on diet and exercise. Wt Readings from Last 3 Encounters:  04/06/18 210 lb (95.3 kg)  10/19/17 207 lb (93.9 kg)  10/01/17 208 lb 3.2 oz (94.4 kg)  Current Medications:  Current Outpatient Medications on File Prior to Visit  Medication Sig Dispense Refill  . albuterol (PROAIR HFA) 108 (90 Base) MCG/ACT inhaler Inhale 2 puffs into the lungs every 6 (six) hours as needed for wheezing or shortness of breath. 18 g 3  . amphetamine-dextroamphetamine (ADDERALL) 20 MG tablet Take 1 tablet (20 mg total) by mouth 2 (two) times daily. 60 tablet 0  . atorvastatin (LIPITOR) 80 MG tablet Take 1 tablet (80 mg total) by mouth daily. 90 tablet 1  .  cetirizine (ZYRTEC) 10 MG tablet Take 10 mg by mouth daily as needed for allergies.     Marland Kitchen esomeprazole (NEXIUM) 40 MG capsule Take 1 capsule (40 mg total) by mouth 2 (two) times daily before a meal. 60 capsule 11  . ferrous sulfate 325 (65 FE) MG tablet Take 1 tablet (325 mg total) by mouth 2 (two) times daily. 60 tablet 11  . fluticasone furoate-vilanterol (BREO ELLIPTA) 100-25 MCG/INH AEPB Inhale 1 puff into the lungs daily. Rinse mouth with water after each use 1 each 2  . lidocaine (LIDODERM) 5 % Place 1 patch onto the skin daily. Remove & Discard patch within 12 hours or as directed by MD 30 patch 3  . metoprolol tartrate (LOPRESSOR) 25 MG tablet Take 1 tablet (25 mg total) by mouth 2 (two) times daily. 180 tablet 1  . varenicline (CHANTIX CONTINUING MONTH PAK) 1 MG tablet Take 1 tablet (1 mg total) by mouth 2 (two) times daily. (Patient not taking: Reported on 04/06/2018) 56 tablet 2   Current Facility-Administered Medications on File Prior to Visit  Medication Dose Route Frequency Provider Last Rate Last Dose  . 0.9 %  sodium chloride infusion  500 mL Intravenous Continuous Irene Shipper, MD      . 0.9 %  sodium chloride infusion  500 mL Intravenous Once Irene Shipper, MD       Medical History:  Past Medical History:  Diagnosis Date  . ADD (attention deficit disorder)   . Allergy   . Appendicitis   . Arthritis   . ASHD (arteriosclerotic heart disease)   . Cancer ([place])   . Colitis   . Crohn's disease ([place])   . Eczema   . GERD (gastroesophageal reflux disease)   . Hyperlipidemia   . Hypertension   . Left renal mass   . Morbid obesity ([place])   . Myocardial infarction [facility])    status post RCA stenting in [facility] 2011  . Positive for macroalbuminuria    Allergies No Known Allergies  SURGICAL HISTORY He  has a past surgical history that includes Appendectomy; Coronary stent placement; Colon surgery; Robotic assited partial nephrectomy (Left, 01/17/2015); and Elbow  surgery. FAMILY HISTORY His family history includes Crohn's disease in his paternal aunt and paternal uncle; Diabetes in his maternal grandmother. SOCIAL HISTORY He  reports that he has been smoking. He has a 30.00 pack-year smoking history. He has never used smokeless tobacco. He reports current alcohol use. He reports current drug use. Drug: Marijuana.   Immunization History  Administered Date(s) Administered  . Td 05/19/2009   Declines the influenza vaccine  Colonoscopy 05/2017 Dr. [name]- has follow up next month Ct AB pelvis 03/17/2014 CXR 06/2017 MRI AB 08/2014 Echo 12/2014  Dr. Sabra Heck eye, 2-3 months  Review of Systems:  Review of Systems  Constitutional: Negative.   HENT: Negative.   Eyes: Negative.   Respiratory: Positive for cough. Negative for hemoptysis, sputum production, shortness of breath  and wheezing.   Cardiovascular: Negative.   Gastrointestinal: Negative.   Genitourinary: Negative.   Musculoskeletal: Negative.   Skin: Positive for rash.  Neurological: Negative.   Endo/Heme/Allergies: Negative.   Psychiatric/Behavioral: Negative.    Physical Exam: There were no vitals taken for this visit. Wt Readings from Last 3 Encounters:  04/06/18 210 lb (95.3 kg)  10/19/17 207 lb (93.9 kg)  10/01/17 208 lb 3.2 oz (94.4 kg)   General Appearance: Well nourished, in no apparent distress. Eyes: PERRLA, EOMs, conjunctiva no swelling or erythema Sinuses: No Frontal/maxillary tenderness ENT/Mouth: Ext aud canals clear, TMs without erythema, bulging. No erythema, swelling, or exudate on post pharynx.  Tonsils not swollen or erythematous. Hearing normal.  Neck: Supple, thyroid normal.  Respiratory: Respiratory effort normal, BS equal bilaterally without rales, rhonchi, wheezing or stridor.  Cardio: RRR with no MRGs. Brisk peripheral pulses without edema.  Abdomen: Soft, + BS, obese,  Non tender, no guarding, rebound, hernias, masses. Lymphatics: Non tender without  lymphadenopathy.  Musculoskeletal: Full ROM, 5/5 strength, Normal Skin: Several healed scars along AB. Warm, dry without rashes, lesions, ecchymosis.  Neuro: Cranial nerves intact. Normal muscle tone except some minor muscle wasting lateral right hand with decrease extension of 3 lateral fingers, normal distal vascular, no cerebellar symptoms. Psych: Awake and oriented X 3, normal affect, Insight and Judgment appropriate.   EKG IRBBB, no ST changes, 1st degree Av block, rate 76 Aorta Scan defer  Vicie Mutters, PA-C 1:46 PM [facility]

## 2018-10-06 ENCOUNTER — Encounter: Payer: Self-pay | Admitting: Physician Assistant

## 2018-11-18 ENCOUNTER — Telehealth: Payer: Self-pay | Admitting: Physician Assistant

## 2018-11-18 DIAGNOSIS — F909 Attention-deficit hyperactivity disorder, unspecified type: Secondary | ICD-10-CM

## 2018-11-18 MED ORDER — AMPHETAMINE-DEXTROAMPHETAMINE 20 MG PO TABS: 20.0000 mg | ORAL_TABLET | Freq: Two times a day (BID) | ORAL | 0 refills | Status: DC

## 2018-11-18 NOTE — Telephone Encounter (Signed)
-----   Message from Elenor Quinones, [place] sent at 11/18/2018 12:32 PM EDT ----- Regarding: refill Contact: [phone] PER PT/YELLOW NOTE:  Refill on ADDERALL Please & thank you!  Pharmacy:  [facility]

## 2018-11-25 NOTE — Progress Notes (Signed)
Complete physical  Assessment and Plan:   Essential hypertension - continue medications, DASH diet, exercise and monitor at home. Call if greater than 130/80.  - CBC with Differential/Platelet - BASIC METABOLIC PANEL WITH GFR - Hepatic function panel - TSH   ASHD (arteriosclerotic heart disease) Control blood pressure, cholesterol, glucose, increase exercise.  Stop smoking   Hyperlipidemia -continue medications, check lipids, decrease fatty foods, increase activity.  - Lipid panel   Crohn's disease of ileum, unspecified complication ([place]) Continue GI follow up   Vitamin D deficiency - VITAMIN D 25 Hydroxy (Vit-D Deficiency, Fractures)   Medication management - Magnesium   Left renal mass A/p nephrectomy, follow up urology   ADD (attention deficit disorder) - amphetamine-dextroamphetamine (ADDERALL) 20 MG tablet; Take 1 tablet (20 mg total) by mouth 2 (two) times daily.  Dispense: 60 tablet; Refill: 0  Encounter for general adult medical examination with abnormal findings  Smoker Advised to quit smoking, will try this week, will send in chantix and do patches  Screening PSA (prostate specific antigen) -     PSA  Atherosclerosis of aorta (HCC) Control blood pressure, cholesterol, glucose, increase exercise.  -     EKG 12-Lead - aorta scan   Continue diet and meds as discussed. Further disposition pending results of labs. Future Appointments  Date Time Provider [place]  06/06/2019  8:45 AM Vicie Mutters, PA-C [facility] None  12/05/2019  9:00 AM Vicie Mutters, PA-C [facility] None    HPI 53 y.o. smoking whit male  presents for CPE and  3 month follow up with hypertension, hyperlipidemia, prediabetes, CAD s/p stent  and vitamin D.  He complains of increasing gerd x 6 months, intermittent, not daily. Substernal burning, no radiation to back or throat. He did start tumeric about 6 months ago. He continue to smoke.  He denies abdominal pain,  constipation, blood/mucus in emesis, melena, hematochezia. Denies NSAID use. Denies ETOH use.    His blood pressure has been controlled at home, today their BP is BP: 122/84  He does not workout but will shovel mulch AND is physically active.  He denies chest pain, shortness of breath, dizziness.  He does not use breo daily due to yeast, will use albuterol 1-2 x a month.   BMI is Body mass index is 31.22 kg/m., he is working on diet and exercise. Wt Readings from Last 3 Encounters:  11/29/18 217 lb 9.6 oz (98.7 kg)  04/06/18 210 lb (95.3 kg)  10/19/17 207 lb (93.9 kg)   Crohn's follows with Dr. [name].  He also follows with Dr. Risa Grill for left renal mass found during crohn's evaluation, had left partial nephrectomy 01/17/2015 and had + renal cell carcinoma following with urology next month.  He continues to smoke but would like to quit. Has done patches in the past and will likely try that. Granddaughter is 81 months old, going to have heart surgery for likely VSD. He is on vacation and will try to stop smoking this week. Has tried chantix without any help in the past.   He has a history of ASHD S/P Stenting in 2010.  He is on ASA.  Denies dyspnea, exertional chest pressure/discomfort and irregular heart beat.   He is on cholesterol medication, lipitor 93m and denies myalgias. His cholesterol is at goal. The cholesterol last visit was:   Lab Results  Component Value Date   CHOL 110 04/06/2018   HDL 29 (L) 04/06/2018   LDLCALC 59 04/06/2018   TRIG 141 04/06/2018  CHOLHDL 3.8 04/06/2018   He has been working on diet and exercise for prediabetes, and denies paresthesia of the feet, polydipsia, polyuria and visual disturbances. Last A1C in the office was:  Lab Results  Component Value Date   HGBA1C 5.6 04/06/2018  Patient is on Vitamin D supplement.   BMI is Body mass index is 31.22 kg/m., he is working on diet and exercise. Wt Readings from Last 3 Encounters:  11/29/18 217 lb 9.6 oz  (98.7 kg)  04/06/18 210 lb (95.3 kg)  10/19/17 207 lb (93.9 kg)    Current Medications:  Current Outpatient Medications on File Prior to Visit  Medication Sig Dispense Refill  . albuterol (PROAIR HFA) 108 (90 Base) MCG/ACT inhaler Inhale 2 puffs into the lungs every 6 (six) hours as needed for wheezing or shortness of breath. 18 g 3  . amphetamine-dextroamphetamine (ADDERALL) 20 MG tablet Take 1 tablet (20 mg total) by mouth 2 (two) times daily. 60 tablet 0  . atorvastatin (LIPITOR) 80 MG tablet Take 1 tablet (80 mg total) by mouth daily. 90 tablet 1  . cetirizine (ZYRTEC) 10 MG tablet Take 10 mg by mouth daily as needed for allergies.     Marland Kitchen esomeprazole (NEXIUM) 40 MG capsule Take 1 capsule (40 mg total) by mouth 2 (two) times daily before a meal. 60 capsule 11  . ferrous sulfate 325 (65 FE) MG tablet Take 1 tablet (325 mg total) by mouth 2 (two) times daily. 60 tablet 11  . fluticasone furoate-vilanterol (BREO ELLIPTA) 100-25 MCG/INH AEPB Inhale 1 puff into the lungs daily. Rinse mouth with water after each use 1 each 2  . lidocaine (LIDODERM) 5 % Place 1 patch onto the skin daily. Remove & Discard patch within 12 hours or as directed by MD 30 patch 3  . metoprolol tartrate (LOPRESSOR) 25 MG tablet Take 1 tablet (25 mg total) by mouth 2 (two) times daily. 180 tablet 1  . varenicline (CHANTIX CONTINUING MONTH PAK) 1 MG tablet Take 1 tablet (1 mg total) by mouth 2 (two) times daily. 56 tablet 2   Current Facility-Administered Medications on File Prior to Visit  Medication Dose Route Frequency Provider Last Rate Last Dose  . 0.9 %  sodium chloride infusion  500 mL Intravenous Continuous Irene Shipper, MD      . 0.9 %  sodium chloride infusion  500 mL Intravenous Once Irene Shipper, MD       Medical History:  Past Medical History:  Diagnosis Date  . ADD (attention deficit disorder)   . Allergy   . Appendicitis   . Arthritis   . ASHD (arteriosclerotic heart disease)   . Cancer ([place])   .  Colitis   . Crohn's disease ([place])   . Eczema   . GERD (gastroesophageal reflux disease)   . Hyperlipidemia   . Hypertension   . Left renal mass   . Morbid obesity ([place])   . Myocardial infarction [facility])    status post RCA stenting in [facility] 2011  . Positive for macroalbuminuria    Allergies No Known Allergies  SURGICAL HISTORY He  has a past surgical history that includes Appendectomy; Coronary stent placement; Colon surgery; Robotic assited partial nephrectomy (Left, 01/17/2015); and Elbow surgery. FAMILY HISTORY His family history includes Crohn's disease in his paternal aunt and paternal uncle; Diabetes in his maternal grandmother. SOCIAL HISTORY He  reports that he has been smoking. He has a 30.00 pack-year smoking history. He has never used smokeless  tobacco. He reports current alcohol use. He reports current drug use. Drug: Marijuana.   Immunization History  Administered Date(s) Administered  . Td 05/19/2009   Declines the influenza vaccine  Colonoscopy 05/2017 Dr. [name] EGD: 04/2017 normal Ct AB pelvis 03/17/2014 CXR 03/2018 MRI AB 08/2014 Echo 12/2014  Dr. Sabra Heck eye, 2-3 months, 10/2017 yearly  Review of Systems:  Review of Systems  Constitutional: Negative.   HENT: Negative.   Eyes: Negative.   Respiratory: Negative for cough, hemoptysis, sputum production, shortness of breath and wheezing.   Cardiovascular: Negative.   Gastrointestinal: Negative.   Genitourinary: Negative.   Musculoskeletal: Negative.   Skin: Negative for rash.  Neurological: Negative.   Endo/Heme/Allergies: Negative.   Psychiatric/Behavioral: Negative.    Physical Exam: BP 122/84   Pulse 74   Temp (!) 97.3 F (36.3 C)   Ht 5' 10"  (1.778 m) Comment: w/shoes  Wt 217 lb 9.6 oz (98.7 kg)   SpO2 97%   BMI 31.22 kg/m  Wt Readings from Last 3 Encounters:  11/29/18 217 lb 9.6 oz (98.7 kg)  04/06/18 210 lb (95.3 kg)  10/19/17 207 lb (93.9 kg)   General Appearance: Well  nourished, in no apparent distress. Eyes: PERRLA, EOMs, conjunctiva no swelling or erythema Sinuses: No Frontal/maxillary tenderness ENT/Mouth: Ext aud canals clear, TMs without erythema, bulging. No erythema, swelling, or exudate on post pharynx.  Tonsils not swollen or erythematous. Hearing normal.  Neck: Supple, thyroid normal.  Respiratory: Respiratory effort normal, BS equal bilaterally without rales, rhonchi, wheezing or stridor.  Cardio: RRR with no MRGs. Brisk peripheral pulses without edema.  Abdomen: Soft, + BS, obese,  Non tender, no guarding, rebound, hernias, masses. Lymphatics: Non tender without lymphadenopathy.  Musculoskeletal: Full ROM, 5/5 strength, Normal Skin: Several healed scars along AB. Warm, dry without rashes, lesions, ecchymosis.  Neuro: Cranial nerves intact. Normal muscle tone except some minor muscle wasting lateral right hand with decrease extension of 3 lateral fingers, normal distal vascular, no cerebellar symptoms. Psych: Awake and oriented X 3, normal affect, Insight and Judgment appropriate.   EKG IRBBB, no ST changes, 1st degree Av block, rate 76 Aorta Scan defer  Vicie Mutters, PA-C 10:27 AM [facility]

## 2018-11-29 ENCOUNTER — Encounter: Payer: Self-pay | Admitting: Physician Assistant

## 2018-11-29 ENCOUNTER — Ambulatory Visit: Payer: Federal, State, Local not specified - PPO | Admitting: Physician Assistant

## 2018-11-29 ENCOUNTER — Other Ambulatory Visit: Payer: Self-pay

## 2018-11-29 VITALS — BP 122/84 | HR 74 | Temp 97.3°F | Ht 70.0 in | Wt 217.6 lb

## 2018-11-29 DIAGNOSIS — E559 Vitamin D deficiency, unspecified: Secondary | ICD-10-CM | POA: Diagnosis not present

## 2018-11-29 DIAGNOSIS — I10 Essential (primary) hypertension: Secondary | ICD-10-CM | POA: Diagnosis not present

## 2018-11-29 DIAGNOSIS — E785 Hyperlipidemia, unspecified: Secondary | ICD-10-CM

## 2018-11-29 DIAGNOSIS — Z130 Encounter for screening for diseases of the blood and blood-forming organs and certain disorders involving the immune mechanism: Secondary | ICD-10-CM

## 2018-11-29 DIAGNOSIS — Z1329 Encounter for screening for other suspected endocrine disorder: Secondary | ICD-10-CM | POA: Diagnosis not present

## 2018-11-29 DIAGNOSIS — Z0000 Encounter for general adult medical examination without abnormal findings: Secondary | ICD-10-CM

## 2018-11-29 DIAGNOSIS — Z13220 Encounter for screening for lipoid disorders: Secondary | ICD-10-CM

## 2018-11-29 DIAGNOSIS — Z125 Encounter for screening for malignant neoplasm of prostate: Secondary | ICD-10-CM

## 2018-11-29 DIAGNOSIS — Z131 Encounter for screening for diabetes mellitus: Secondary | ICD-10-CM

## 2018-11-29 DIAGNOSIS — I700 Atherosclerosis of aorta: Secondary | ICD-10-CM

## 2018-11-29 DIAGNOSIS — N2889 Other specified disorders of kidney and ureter: Secondary | ICD-10-CM

## 2018-11-29 DIAGNOSIS — Z1389 Encounter for screening for other disorder: Secondary | ICD-10-CM | POA: Diagnosis not present

## 2018-11-29 DIAGNOSIS — I2510 Atherosclerotic heart disease of native coronary artery without angina pectoris: Secondary | ICD-10-CM

## 2018-11-29 DIAGNOSIS — D509 Iron deficiency anemia, unspecified: Secondary | ICD-10-CM

## 2018-11-29 DIAGNOSIS — K50019 Crohn's disease of small intestine with unspecified complications: Secondary | ICD-10-CM

## 2018-11-29 DIAGNOSIS — Z79899 Other long term (current) drug therapy: Secondary | ICD-10-CM

## 2018-11-29 DIAGNOSIS — R350 Frequency of micturition: Secondary | ICD-10-CM | POA: Diagnosis not present

## 2018-11-29 DIAGNOSIS — F988 Other specified behavioral and emotional disorders with onset usually occurring in childhood and adolescence: Secondary | ICD-10-CM

## 2018-11-29 DIAGNOSIS — N401 Enlarged prostate with lower urinary tract symptoms: Secondary | ICD-10-CM

## 2018-11-29 DIAGNOSIS — Z136 Encounter for screening for cardiovascular disorders: Secondary | ICD-10-CM | POA: Diagnosis not present

## 2018-11-29 DIAGNOSIS — F17200 Nicotine dependence, unspecified, uncomplicated: Secondary | ICD-10-CM

## 2018-11-29 DIAGNOSIS — Z0001 Encounter for general adult medical examination with abnormal findings: Secondary | ICD-10-CM

## 2018-11-29 NOTE — Patient Instructions (Addendum)
Take omeprazole over the counter for 2 weeks, then go to  pepcid 20 or 69m at night for 2 weeks, then you can stop or continue as needed.  Avoid alcohol, spicy foods, NSAIDS (aleve, ibuprofen) at this time. See foods below.  AVOID THE TUMERIC ADVISED TO STOP SMOKING  Food Choices for Gastroesophageal Reflux Disease When you have gastroesophageal reflux disease (GERD), the foods you eat and your eating habits are very important. Choosing the right foods can help ease the discomfort of GERD. WHAT GENERAL GUIDELINES DO I NEED TO FOLLOW?  Choose fruits, vegetables, whole grains, low-fat dairy products, and low-fat meat, fish, and poultry.  Limit fats such as oils, salad dressings, butter, nuts, and avocado.  Keep a food diary to identify foods that cause symptoms.  Avoid foods that cause reflux. These may be different for different people.  Eat frequent small meals instead of three large meals each day.  Eat your meals slowly, in a relaxed setting.  Limit fried foods.  Cook foods using methods other than frying.  Avoid drinking alcohol.  Avoid drinking large amounts of liquids with your meals.  Avoid bending over or lying down until 2-3 hours after eating. WHAT FOODS ARE NOT RECOMMENDED? The following are some foods and drinks that may worsen your symptoms: Vegetables Tomatoes. Tomato juice. Tomato and spaghetti sauce. Chili peppers. Onion and garlic. Horseradish. Fruits Oranges, grapefruit, and lemon (fruit and juice). Meats High-fat meats, fish, and poultry. This includes hot dogs, ribs, ham, sausage, salami, and bacon. Dairy Whole milk and chocolate milk. Sour cream. Cream. Butter. Ice cream. Cream cheese.  Beverages Coffee and tea, with or without caffeine. Carbonated beverages or energy drinks. Condiments Hot sauce. Barbecue sauce.  Sweets/Desserts Chocolate and cocoa. Donuts. Peppermint and spearmint. Fats and Oils High-fat foods, including [place]ries and potato  chips. Other Vinegar. Strong spices, such as black pepper, white pepper, red pepper, cayenne, curry powder, cloves, ginger, and chili powder.     When it comes to diets, agreement about the perfect plan isn't easy to find, even among the experts. Experts at the [employer]ed an idea known as the Healthy Eating Plate. Just imagine a plate divided into logical, healthy portions.  The emphasis is on diet quality:  Load up on vegetables and fruits - one-half of your plate: Aim for color and variety, and remember that potatoes don't count.  Go for whole grains - one-quarter of your plate: Whole wheat, barley, wheat berries, quinoa, oats, brown rice, and foods made with them. If you want pasta, go with whole wheat pasta.  Protein power - one-quarter of your plate: Fish, chicken, beans, and nuts are all healthy, versatile protein sources. Limit red meat.  The diet, however, does go beyond the plate, offering a few other suggestions.  Use healthy plant oils, such as olive, canola, soy, corn, sunflower and peanut. Check the labels, and avoid partially hydrogenated oil, which have unhealthy trans fats.  If you're thirsty, drink water. Coffee and tea are good in moderation, but skip sugary drinks and limit milk and dairy products to one or two daily servings.  The type of carbohydrate in the diet is more important than the amount. Some sources of carbohydrates, such as vegetables, fruits, whole grains, and beans-are healthier than others.  Finally, stay active.

## 2018-11-30 LAB — CBC WITH DIFFERENTIAL/PLATELET
Absolute Monocytes: 480 cells/uL (ref 200–950)
Basophils Absolute: 60 cells/uL (ref 0–200)
Basophils Relative: 0.8 %
Eosinophils Absolute: 203 cells/uL (ref 15–500)
Eosinophils Relative: 2.7 %
HCT: 44.8 % (ref 38.5–50.0)
Hemoglobin: 14.8 g/dL (ref 13.2–17.1)
Lymphs Abs: 1193 cells/uL (ref 850–3900)
MCH: 31 pg (ref 27.0–33.0)
MCHC: 33 g/dL (ref 32.0–36.0)
MCV: 93.7 fL (ref 80.0–100.0)
MPV: 10.6 fL (ref 7.5–12.5)
Monocytes Relative: 6.4 %
Neutro Abs: 5565 cells/uL (ref 1500–7800)
Neutrophils Relative %: 74.2 %
Platelets: 302 10*3/uL (ref 140–400)
RBC: 4.78 10*6/uL (ref 4.20–5.80)
RDW: 13.1 % (ref 11.0–15.0)
Total Lymphocyte: 15.9 %
WBC: 7.5 10*3/uL (ref 3.8–10.8)

## 2018-11-30 LAB — URINALYSIS, ROUTINE W REFLEX MICROSCOPIC
Bacteria, UA: NONE SEEN /HPF
Bilirubin Urine: NEGATIVE
Glucose, UA: NEGATIVE
Hgb urine dipstick: NEGATIVE
Hyaline Cast: NONE SEEN /LPF
Ketones, ur: NEGATIVE
Leukocytes,Ua: NEGATIVE
Nitrite: NEGATIVE
Specific Gravity, Urine: 1.019 (ref 1.001–1.03)
Squamous Epithelial / HPF: NONE SEEN /HPF (ref <=5)
WBC, UA: NONE SEEN /HPF (ref 0–5)
pH: <=5 (ref 5.0–8.0)

## 2018-11-30 LAB — VITAMIN B12: Vitamin B-12: 759 pg/mL (ref 200–1100)

## 2018-11-30 LAB — COMPLETE METABOLIC PANEL WITH GFR
AG Ratio: 1.3 (calc) (ref 1.0–2.5)
ALT: 22 U/L (ref 9–46)
AST: 17 U/L (ref 10–35)
Albumin: 3.9 g/dL (ref 3.6–5.1)
Alkaline phosphatase (APISO): 110 U/L (ref 35–144)
BUN: 14 mg/dL (ref 7–25)
CO2: 25 mmol/L (ref 20–32)
Calcium: 9.1 mg/dL (ref 8.6–10.3)
Chloride: 108 mmol/L (ref 98–110)
Creat: 0.82 mg/dL (ref 0.70–1.33)
GFR, Est African American: 116 mL/min/{1.73_m2} (ref >=60)
GFR, Est Non African American: 100 mL/min/{1.73_m2} (ref >=60)
Globulin: 3.1 g/dL (calc) (ref 1.9–3.7)
Glucose, Bld: 88 mg/dL (ref 65–99)
Potassium: 4.2 mmol/L (ref 3.5–5.3)
Sodium: 138 mmol/L (ref 135–146)
Total Bilirubin: 0.4 mg/dL (ref 0.2–1.2)
Total Protein: 7 g/dL (ref 6.1–8.1)

## 2018-11-30 LAB — HEMOGLOBIN A1C
Hgb A1c MFr Bld: 5.4 % of total Hgb (ref <5.7)
Mean Plasma Glucose: 108 (calc)
eAG (mmol/L): 6 (calc)

## 2018-11-30 LAB — LIPID PANEL
Cholesterol: 120 mg/dL (ref <200)
HDL: 37 mg/dL — ABNORMAL LOW (ref >=40)
LDL Cholesterol (Calc): 64 mg/dL (calc)
Non-HDL Cholesterol (Calc): 83 mg/dL (calc) (ref <130)
Total CHOL/HDL Ratio: 3.2 (calc) (ref <5.0)
Triglycerides: 105 mg/dL (ref <150)

## 2018-11-30 LAB — MICROALBUMIN / CREATININE URINE RATIO
Creatinine, Urine: 196 mg/dL (ref 20–320)
Microalb Creat Ratio: 55 mcg/mg creat — ABNORMAL HIGH (ref <30)
Microalb, Ur: 10.7 mg/dL

## 2018-11-30 LAB — TSH: TSH: 2.22 mIU/L (ref 0.40–4.50)

## 2018-11-30 LAB — TESTOSTERONE: Testosterone: 627 ng/dL (ref 250–827)

## 2018-11-30 LAB — IRON, TOTAL/TOTAL IRON BINDING CAP
%SAT: 28 % (calc) (ref 20–48)
Iron: 84 ug/dL (ref 50–180)
TIBC: 305 mcg/dL (calc) (ref 250–425)

## 2018-11-30 LAB — VITAMIN D 25 HYDROXY (VIT D DEFICIENCY, FRACTURES): Vit D, 25-Hydroxy: 50 ng/mL (ref 30–100)

## 2018-11-30 LAB — PSA: PSA: 0.5 ng/mL (ref <=4.0)

## 2018-11-30 LAB — MAGNESIUM: Magnesium: 1.8 mg/dL (ref 1.5–2.5)

## 2018-12-05 ENCOUNTER — Other Ambulatory Visit: Payer: Self-pay | Admitting: Internal Medicine

## 2019-01-14 ENCOUNTER — Telehealth: Payer: Self-pay | Admitting: Physician Assistant

## 2019-01-14 DIAGNOSIS — F909 Attention-deficit hyperactivity disorder, unspecified type: Secondary | ICD-10-CM

## 2019-01-14 MED ORDER — AMPHETAMINE-DEXTROAMPHETAMINE 20 MG PO TABS: 20.0000 mg | ORAL_TABLET | Freq: Two times a day (BID) | ORAL | 0 refills | Status: DC

## 2019-01-14 NOTE — Telephone Encounter (Signed)
Recent Visits Date Type Provider Dept  11/29/18 Office Visit Vicie Mutters, [place]  04/06/18 Office Visit Garnet Sierras, NP [facility]  10/01/17 Office Visit Vicie Mutters, PA-C [place] recent visits within past 540 days with a meds authorizing provider and meeting all other requirements   Future Appointments Date Type Provider Dept  04/04/19 Appointment Vicie Mutters, PA-C [facility]  06/06/19 Appointment Vicie Mutters, PA-C [facility]  Showing future appointments within next 150 days with a meds authorizing provider and meeting all other requirements

## 2019-01-15 IMAGING — CR DG CHEST 2V
2 series · 2 of 2 positions shown · non-contrast
Comparison: 10/01/2015

CLINICAL DATA: Cough, congestion

EXAM:
CHEST  2 VIEW

[chest pa]
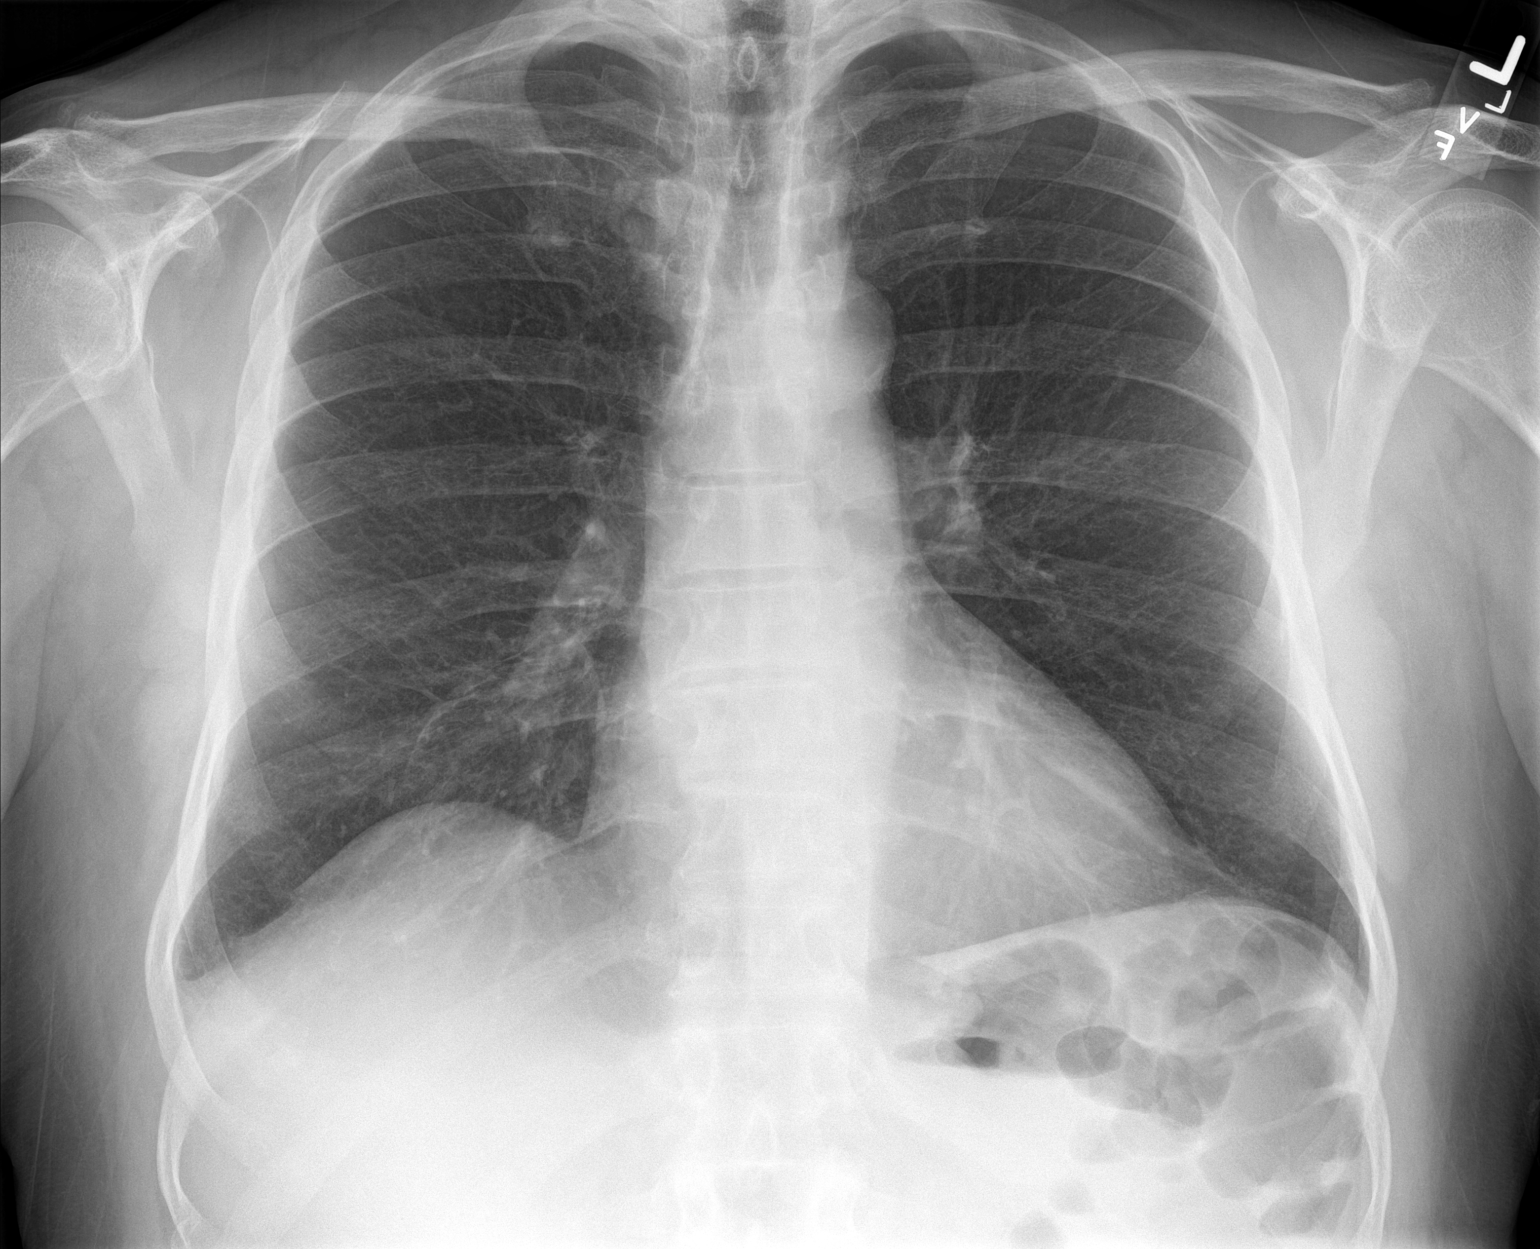

[chest lat]
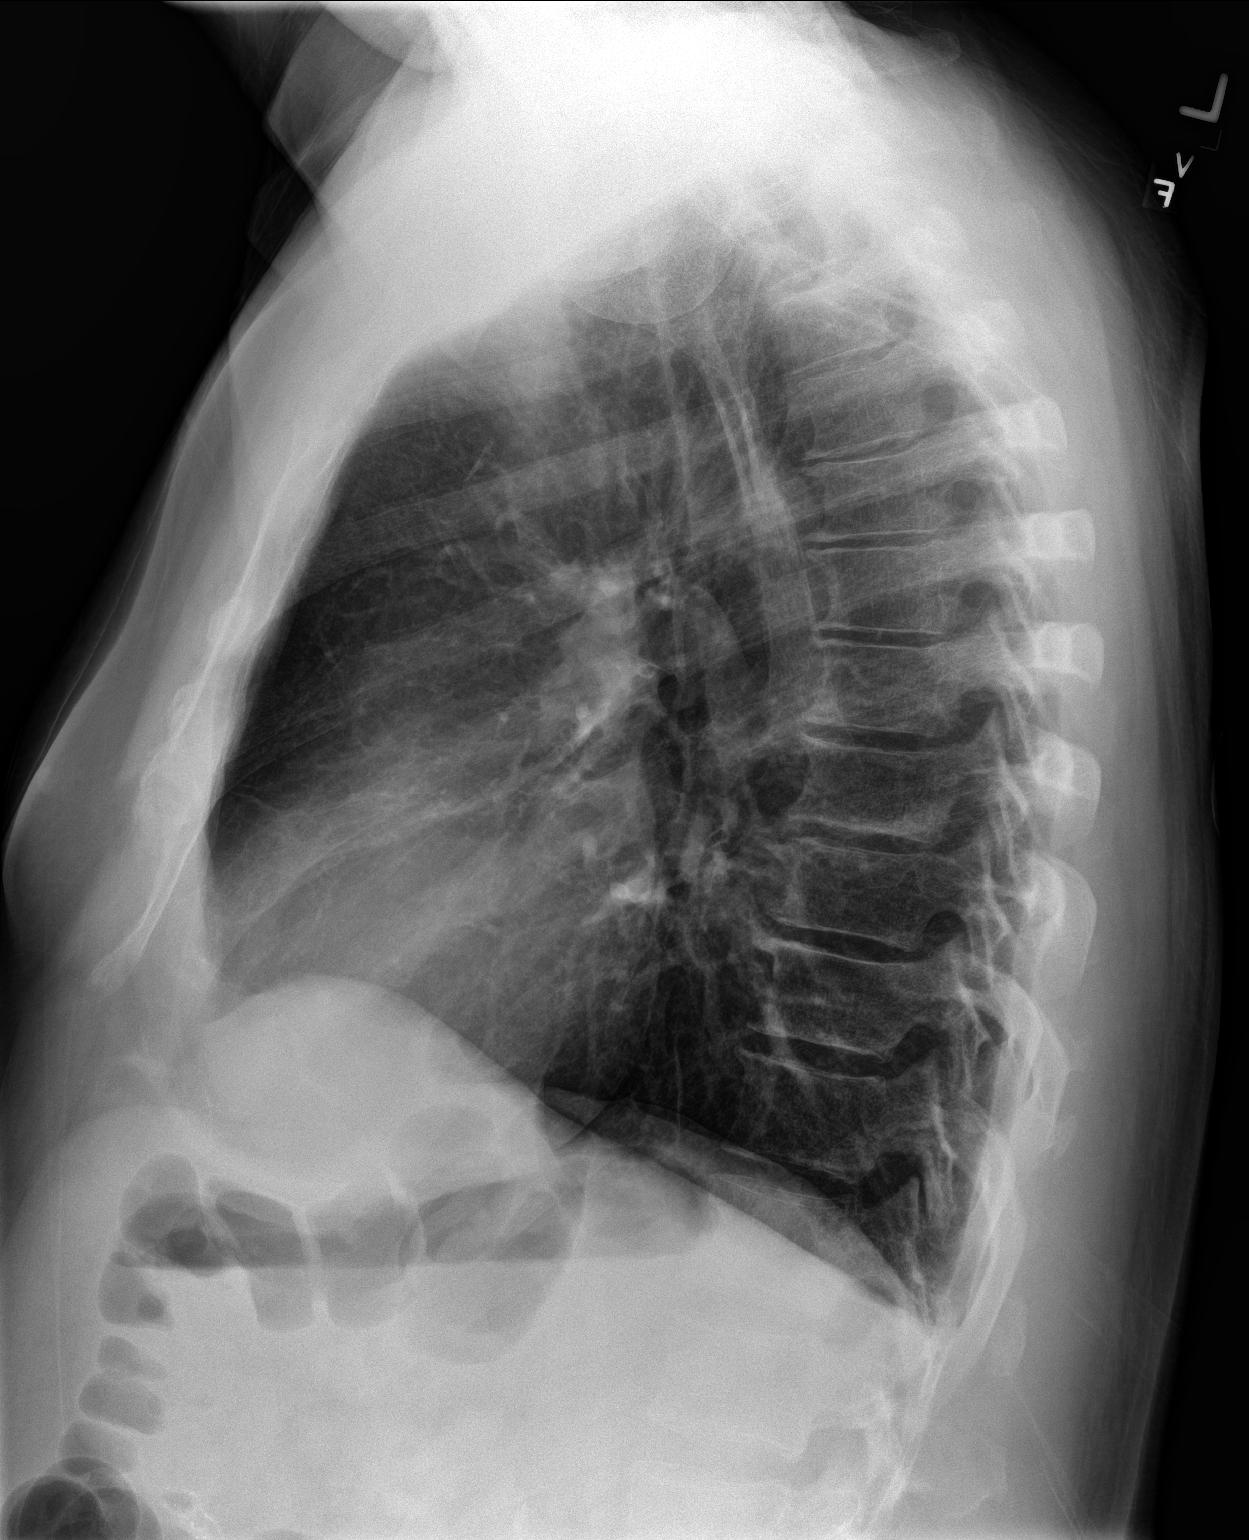

[2 of 2 positions shown; findings below may reference images not displayed]

FINDINGS: Heart and mediastinal contours are within normal limits. No focal
opacities or effusions. No acute bony abnormality.
IMPRESSION: No active cardiopulmonary disease.

## 2019-02-27 DIAGNOSIS — Z20828 Contact with and (suspected) exposure to other viral communicable diseases: Secondary | ICD-10-CM | POA: Diagnosis not present

## 2019-03-30 NOTE — Progress Notes (Signed)
FOLLOW UP  Assessment and Plan:   Essential hypertension - continue medications, DASH diet, exercise and monitor at home. Call if greater than 130/80.  -     CBC with Diff -     COMPLETE METABOLIC PANEL WITH GFR -     TSH  ASHD (arteriosclerotic heart disease) Control blood pressure, cholesterol, glucose, increase exercise.  Continue cardio follow up Advised to quit smoking  Atherosclerosis of aorta (HCC) Control blood pressure, cholesterol, glucose, increase exercise.   Crohn's disease of ileum with complication ([place]) Continue follow up  Hyperlipidemia, unspecified hyperlipidemia type -     Lipid Profile check lipids decrease fatty foods increase activity.   Morbid obesity ([place]) - follow up 3 months for progress monitoring - increase veggies, decrease carbs - long discussion about weight loss, diet, and exercise  Medication management -     Magnesium  Vitamin D deficiency -     Vitamin D (25 hydroxy)  Smoker Will be elidgable for low dose CT scan next year, will not get CXR this year and try to get low dose CT screen  Smoking cessation-  instruction/counseling given, counseled patient on the dangers of tobacco use, advised patient to stop smoking, and reviewed strategies to maximize success, patient not ready to quit at this time.  -     albuterol (PROAIR HFA) 108 (90 Base) MCG/ACT inhaler; Inhale 2 puffs into the lungs every 6 (six) hours as needed for wheezing or shortness of breath. -     fluticasone furoate-vilanterol (BREO ELLIPTA) 100-25 MCG/INH AEPB; Inhale 1 puff into the lungs daily. Rinse mouth with water after each use  Attention deficit hyperactivity disorder (ADHD), unspecified ADHD type -     amphetamine-dextroamphetamine (ADDERALL) 20 MG tablet; Take 1 tablet (20 mg total) by mouth 2 (two) times daily.     Continue diet and meds as discussed. Further disposition pending results of labs. Discussed med's effects and SE's.   Over 30 minutes of exam,  counseling, chart review, and critical decision making was performed.   Future Appointments  Date Time Provider [place]  06/06/2019  8:45 AM Vicie Mutters, PA-C [facility] None  12/05/2019  9:00 AM Vicie Mutters, PA-C [facility] None    ------------------------------------------------------ ----------------------------------------------------------------  HPI 53 y.o. male  presents for 3 month follow up on hypertension, cholesterol, diabetes, weight and vitamin D deficiency.   He does continue to smoke, states he tried the chantix, no AE's but it did not help. He does not use the breo daily, long discussion about emergency versus maintenance inhaler. Will refill both.  CXR 03/2018- will wait until age 53 40 pack year smoking history, can discuss low dose CT age 53.   BMI is Body mass index is 30.56 kg/m., he has not been working on diet and exercise. Wt Readings from Last 3 Encounters:  04/04/19 213 lb (96.6 kg)  11/29/18 217 lb 9.6 oz (98.7 kg)  04/06/18 210 lb (95.3 kg)    His blood pressure has been controlled at home, today their BP is BP: 126/86  He does not workout. He denies chest pain, shortness of breath, dizziness.   He is on cholesterol medication Atorvastatin 80 mg and denies myalgias. His cholesterol is at goal. His good cholesterol is low and not at goal. The cholesterol last visit was:   Lab Results  Component Value Date   CHOL 120 11/29/2018   HDL 37 (L) 11/29/2018   LDLCALC 64 11/29/2018   TRIG 105 11/29/2018   CHOLHDL  3.2 11/29/2018    He has not been working on diet and exercise for prediabetes, and denies hyperglycemia, hypoglycemia , polydipsia and polyuria. Last A1C in the office was:  Lab Results  Component Value Date   HGBA1C 5.4 11/29/2018   Patient is on Vitamin D supplement.   Lab Results  Component Value Date   VD25OH 50 11/29/2018        Current Medications:  Current Outpatient Medications on File Prior to Visit  Medication  Sig  . albuterol (PROAIR HFA) 108 (90 Base) MCG/ACT inhaler Inhale 2 puffs into the lungs every 6 (six) hours as needed for wheezing or shortness of breath.  . amphetamine-dextroamphetamine (ADDERALL) 20 MG tablet Take 1 tablet (20 mg total) by mouth 2 (two) times daily.  Marland Kitchen atorvastatin (LIPITOR) 80 MG tablet Take 1 tablet (80 mg total) by mouth daily.  . cetirizine (ZYRTEC) 10 MG tablet Take 10 mg by mouth daily as needed for allergies.   Marland Kitchen esomeprazole (NEXIUM) 40 MG capsule Take 1 capsule (40 mg total) by mouth 2 (two) times daily before a meal.  . ferrous sulfate 325 (65 FE) MG tablet Take 1 tablet (325 mg total) by mouth 2 (two) times daily. Patient needs office visit for further refills  . fluticasone furoate-vilanterol (BREO ELLIPTA) 100-25 MCG/INH AEPB Inhale 1 puff into the lungs daily. Rinse mouth with water after each use  . lidocaine (LIDODERM) 5 % Place 1 patch onto the skin daily. Remove & Discard patch within 12 hours or as directed by MD  . metoprolol tartrate (LOPRESSOR) 25 MG tablet Take 1 tablet (25 mg total) by mouth 2 (two) times daily.  . varenicline (CHANTIX CONTINUING MONTH PAK) 1 MG tablet Take 1 tablet (1 mg total) by mouth 2 (two) times daily.   Current Facility-Administered Medications on File Prior to Visit  Medication  . 0.9 %  sodium chloride infusion  . 0.9 %  sodium chloride infusion    Allergies: No Known Allergies   Medical History:  Past Medical History:  Diagnosis Date  . ADD (attention deficit disorder)   . Allergy   . Appendicitis   . Arthritis   . ASHD (arteriosclerotic heart disease)   . Cancer ([place])   . Colitis   . Crohn's disease ([place])   . Eczema   . GERD (gastroesophageal reflux disease)   . Hyperlipidemia   . Hypertension   . Left renal mass   . Morbid obesity ([place])   . Myocardial infarction [facility])    status post RCA stenting in [facility] 2011  . Positive for macroalbuminuria    Family history- Reviewed and unchanged Social  history- Reviewed and unchanged  Review of Systems:  Review of Systems  Constitutional: Negative for chills, diaphoresis, fever, malaise/fatigue and weight loss.  HENT: Negative for congestion, ear discharge, ear pain, hearing loss, nosebleeds, sinus pain, sore throat and tinnitus.   Eyes: Negative for blurred vision, double vision, photophobia, pain, discharge and redness.  Respiratory: Negative for cough, hemoptysis, sputum production, shortness of breath, wheezing and stridor.   Cardiovascular: Negative for chest pain, palpitations, orthopnea, claudication, leg swelling and PND.  Gastrointestinal: Negative for abdominal pain, blood in stool, constipation, diarrhea, heartburn, melena, nausea and vomiting.  Genitourinary: Negative for dysuria, flank pain, frequency, hematuria and urgency.  Musculoskeletal: Negative for back pain, falls, joint pain, myalgias and neck pain.  Skin: Negative for itching and rash.  Neurological: Negative for dizziness, tingling, tremors, sensory change, speech change, focal weakness, seizures, loss  of consciousness, weakness and headaches.  Endo/Heme/Allergies: Negative for environmental allergies and polydipsia. Does not bruise/bleed easily.  Psychiatric/Behavioral: Negative for depression, hallucinations, memory loss, substance abuse and suicidal ideas. The patient is not nervous/anxious and does not have insomnia.     Physical Exam: BP 126/86   Pulse 65   Temp (!) 97.5 F (36.4 C)   Wt 213 lb (96.6 kg)   SpO2 97%   BMI 30.56 kg/m  Wt Readings from Last 3 Encounters:  04/04/19 213 lb (96.6 kg)  11/29/18 217 lb 9.6 oz (98.7 kg)  04/06/18 210 lb (95.3 kg)   General Appearance: Well nourished, in no apparent distress. Eyes: PERRLA, EOMs, conjunctiva no swelling or erythema Sinuses: No Frontal/maxillary tenderness ENT/Mouth: Ext aud canals clear, TMs without erythema, bulging. No erythema, swelling, or exudate on post pharynx.  Tonsils not swollen or  erythematous. Hearing normal.  Neck: Supple, thyroid normal.  Respiratory: Respiratory effort normal, BS equal bilaterally without rales, rhonchi, wheezing or stridor.  Cardio: RRR with no MRGs. Brisk peripheral pulses without edema.  Abdomen: Soft, + BS.  Non tender, no guarding, rebound, hernias, masses. Lymphatics: Non tender without lymphadenopathy.  Musculoskeletal: Full ROM, 5/5 strength, Normal gait Skin: Warm, dry without rashes, lesions, ecchymosis.  Neuro: Cranial nerves intact. No cerebellar symptoms.  Psych: Awake and oriented X 3, normal affect, Insight and Judgment appropriate.   Vicie Mutters, PA-C 8:48 AM [facility]

## 2019-04-04 ENCOUNTER — Ambulatory Visit: Payer: Federal, State, Local not specified - PPO | Admitting: Physician Assistant

## 2019-04-04 ENCOUNTER — Encounter: Payer: Self-pay | Admitting: Physician Assistant

## 2019-04-04 ENCOUNTER — Other Ambulatory Visit: Payer: Self-pay

## 2019-04-04 VITALS — BP 126/86 | HR 65 | Temp 97.5°F | Wt 213.0 lb

## 2019-04-04 DIAGNOSIS — I700 Atherosclerosis of aorta: Secondary | ICD-10-CM | POA: Diagnosis not present

## 2019-04-04 DIAGNOSIS — E559 Vitamin D deficiency, unspecified: Secondary | ICD-10-CM

## 2019-04-04 DIAGNOSIS — Z79899 Other long term (current) drug therapy: Secondary | ICD-10-CM | POA: Diagnosis not present

## 2019-04-04 DIAGNOSIS — I10 Essential (primary) hypertension: Secondary | ICD-10-CM

## 2019-04-04 DIAGNOSIS — F909 Attention-deficit hyperactivity disorder, unspecified type: Secondary | ICD-10-CM

## 2019-04-04 DIAGNOSIS — K50019 Crohn's disease of small intestine with unspecified complications: Secondary | ICD-10-CM

## 2019-04-04 DIAGNOSIS — E785 Hyperlipidemia, unspecified: Secondary | ICD-10-CM

## 2019-04-04 DIAGNOSIS — I2510 Atherosclerotic heart disease of native coronary artery without angina pectoris: Secondary | ICD-10-CM | POA: Diagnosis not present

## 2019-04-04 DIAGNOSIS — F17200 Nicotine dependence, unspecified, uncomplicated: Secondary | ICD-10-CM

## 2019-04-04 MED ORDER — AMPHETAMINE-DEXTROAMPHETAMINE 20 MG PO TABS: 20.0000 mg | ORAL_TABLET | Freq: Two times a day (BID) | ORAL | 0 refills | Status: DC

## 2019-04-04 MED ORDER — ALBUTEROL SULFATE HFA 108 (90 BASE) MCG/ACT IN AERS: 2.0000 | INHALATION_SPRAY | Freq: Four times a day (QID) | RESPIRATORY_TRACT | 3 refills | Status: DC | PRN

## 2019-04-04 MED ORDER — BREO ELLIPTA 100-25 MCG/INH IN AEPB: 1.0000 | INHALATION_SPRAY | Freq: Every day | RESPIRATORY_TRACT | 2 refills | Status: DC

## 2019-04-04 NOTE — Patient Instructions (Signed)
  SMOKING CESSATION  [employer]  [phone] for more information or for a free program for smoking cessation help.   You can call QUIT SMART 1-800-QUIT-NOW for free nicotine patches or replacement therapy- if they are out- keep calling  [facility] Can call for smoking cessation classes, [phone]  If you have a smart phone, please look up Smoke Free app, this will help you stay on track and give you information about money you have saved, life that you have gained back and a ton of more information.     ADVANTAGES OF QUITTING SMOKING  Within 20 minutes, blood pressure decreases. Your pulse is at normal level.  After 8 hours, carbon monoxide levels in the blood return to normal. Your oxygen level increases.  After 24 hours, the chance of having a heart attack starts to decrease. Your breath, hair, and body stop smelling like smoke.  After 48 hours, damaged nerve endings begin to recover. Your sense of taste and smell improve.  After 72 hours, the body is virtually free of nicotine. Your bronchial tubes relax and breathing becomes easier.  After 2 to 12 weeks, lungs can hold more air. Exercise becomes easier and circulation improves.  After 1 year, the risk of coronary heart disease is cut in half.  After 5 years, the risk of stroke falls to the same as a nonsmoker.  After 10 years, the risk of lung cancer is cut in half and the risk of other cancers decreases significantly.  After 15 years, the risk of coronary heart disease drops, usually to the level of a nonsmoker.  You will have extra money to spend on things other than cigarettes.   Here is some information to help you keep your heart healthy: Move it! - Aim for 30 mins of activity every day. Take it slowly at first. Talk to [place] before starting any new exercise program.   Lose it.  -Body Mass Index (BMI) can indicate if you need to lose weight. A healthy range is 18.5-24.9. For a BMI  calculator, go to [employer]  Waist Management -Excess abdominal fat is a risk factor for heart disease, diabetes, asthma, stroke and more. Ideal waist circumference is less than 35" for women and less than 40" for men.   Eat Right -focus on fruits, vegetables, whole grains, and meals you make yourself. Avoid foods with trans fat and high sugar/sodium content.   Snooze or Snore? - Loud snoring can be a sign of sleep apnea, a significant risk factor for high blood pressure, heart attach, stroke, and heart arrhythmias.  Kick the habit -Quit Smoking! Avoid second hand smoke. A single cigarette raises your blood pressure for 20 mins and increases the risk of heart attack and stroke for the next 24 hours.   Are Aspirin and Supplements right for you? -Add ENTERIC COATED low dose 81 mg Aspirin daily OR can do every other day if you have easy bruising to protect your heart and head. As well as to reduce risk of Colon Cancer by 20 %, Skin Cancer by 26 % , Melanoma by 46% and Pancreatic cancer by 60%  Say "No to Stress -There may be little you can do about problems that cause stress. However, techniques such as long walks, meditation, and exercise can help you manage it.   Start Now! - Make changes one at a time and set reasonable goals to increase your likelihood of success.

## 2019-04-05 LAB — CBC WITH DIFFERENTIAL/PLATELET
Absolute Monocytes: 484 cells/uL (ref 200–950)
Basophils Absolute: 39 cells/uL (ref 0–200)
Basophils Relative: 0.5 %
Eosinophils Absolute: 218 cells/uL (ref 15–500)
Eosinophils Relative: 2.8 %
HCT: 43.8 % (ref 38.5–50.0)
Hemoglobin: 14.7 g/dL (ref 13.2–17.1)
Lymphs Abs: 1045 cells/uL (ref 850–3900)
MCH: 30.6 pg (ref 27.0–33.0)
MCHC: 33.6 g/dL (ref 32.0–36.0)
MCV: 91.1 fL (ref 80.0–100.0)
MPV: 10.4 fL (ref 7.5–12.5)
Monocytes Relative: 6.2 %
Neutro Abs: 6014 cells/uL (ref 1500–7800)
Neutrophils Relative %: 77.1 %
Platelets: 321 10*3/uL (ref 140–400)
RBC: 4.81 10*6/uL (ref 4.20–5.80)
RDW: 13.4 % (ref 11.0–15.0)
Total Lymphocyte: 13.4 %
WBC: 7.8 10*3/uL (ref 3.8–10.8)

## 2019-04-05 LAB — TSH: TSH: 2.08 mIU/L (ref 0.40–4.50)

## 2019-04-05 LAB — VITAMIN D 25 HYDROXY (VIT D DEFICIENCY, FRACTURES): Vit D, 25-Hydroxy: 46 ng/mL (ref 30–100)

## 2019-04-05 LAB — COMPLETE METABOLIC PANEL WITH GFR
AG Ratio: 1.3 (calc) (ref 1.0–2.5)
ALT: 29 U/L (ref 9–46)
AST: 19 U/L (ref 10–35)
Albumin: 4 g/dL (ref 3.6–5.1)
Alkaline phosphatase (APISO): 98 U/L (ref 35–144)
BUN: 13 mg/dL (ref 7–25)
CO2: 24 mmol/L (ref 20–32)
Calcium: 9.3 mg/dL (ref 8.6–10.3)
Chloride: 106 mmol/L (ref 98–110)
Creat: 0.83 mg/dL (ref 0.70–1.33)
GFR, Est African American: 116 mL/min/{1.73_m2} (ref >=60)
GFR, Est Non African American: 100 mL/min/{1.73_m2} (ref >=60)
Globulin: 3.1 g/dL (calc) (ref 1.9–3.7)
Glucose, Bld: 96 mg/dL (ref 65–99)
Potassium: 4.5 mmol/L (ref 3.5–5.3)
Sodium: 139 mmol/L (ref 135–146)
Total Bilirubin: 0.3 mg/dL (ref 0.2–1.2)
Total Protein: 7.1 g/dL (ref 6.1–8.1)

## 2019-04-05 LAB — LIPID PANEL
Cholesterol: 145 mg/dL (ref <200)
HDL: 37 mg/dL — ABNORMAL LOW (ref >=40)
LDL Cholesterol (Calc): 88 mg/dL (calc)
Non-HDL Cholesterol (Calc): 108 mg/dL (calc) (ref <130)
Total CHOL/HDL Ratio: 3.9 (calc) (ref <5.0)
Triglycerides: 105 mg/dL (ref <150)

## 2019-04-05 LAB — MAGNESIUM: Magnesium: 2 mg/dL (ref 1.5–2.5)

## 2019-05-30 ENCOUNTER — Other Ambulatory Visit: Payer: Self-pay

## 2019-05-30 DIAGNOSIS — I10 Essential (primary) hypertension: Secondary | ICD-10-CM

## 2019-05-30 DIAGNOSIS — E785 Hyperlipidemia, unspecified: Secondary | ICD-10-CM

## 2019-05-30 DIAGNOSIS — I2510 Atherosclerotic heart disease of native coronary artery without angina pectoris: Secondary | ICD-10-CM

## 2019-05-30 DIAGNOSIS — F909 Attention-deficit hyperactivity disorder, unspecified type: Secondary | ICD-10-CM

## 2019-05-30 MED ORDER — METOPROLOL TARTRATE 25 MG PO TABS: 25.0000 mg | ORAL_TABLET | Freq: Two times a day (BID) | ORAL | 1 refills | Status: DC

## 2019-05-30 MED ORDER — ATORVASTATIN CALCIUM 80 MG PO TABS: 80.0000 mg | ORAL_TABLET | Freq: Every day | ORAL | 1 refills | Status: DC

## 2019-05-31 ENCOUNTER — Telehealth: Payer: Self-pay | Admitting: Physician Assistant

## 2019-05-31 DIAGNOSIS — F909 Attention-deficit hyperactivity disorder, unspecified type: Secondary | ICD-10-CM

## 2019-05-31 MED ORDER — AMPHETAMINE-DEXTROAMPHETAMINE 20 MG PO TABS: 20.0000 mg | ORAL_TABLET | Freq: Two times a day (BID) | ORAL | 0 refills | Status: DC

## 2019-05-31 NOTE — Telephone Encounter (Signed)
-----   Message from Elenor Quinones, [place] sent at 05/30/2019  3:40 PM EST ----- Regarding: PER OFFICE NOTE Contact: [phone] PER PT/YELLOW NOTE:  Refill on ADDERALL Please & thank you!  Pharmacy:  [facility]

## 2019-06-06 ENCOUNTER — Ambulatory Visit: Payer: Federal, State, Local not specified - PPO | Admitting: Physician Assistant

## 2019-08-01 ENCOUNTER — Ambulatory Visit: Payer: Federal, State, Local not specified - PPO | Admitting: Physician Assistant

## 2019-08-17 NOTE — Progress Notes (Signed)
FOLLOW UP  Assessment and Plan:   Essential hypertension - continue medications, DASH diet, exercise and monitor at home. Call if greater than 130/80.  -     CBC with Diff -     COMPLETE METABOLIC PANEL WITH GFR -     TSH  ASHD (arteriosclerotic heart disease) Control blood pressure, cholesterol, glucose, increase exercise.  Continue cardio follow up Advised to quit smoking  Atherosclerosis of aorta (HCC) Control blood pressure, cholesterol, glucose, increase exercise.   Crohn's disease of ileum with complication ([place]) Continue follow up  Hyperlipidemia, unspecified hyperlipidemia type -     Lipid Profile check lipids- states he is taking lipitor 3 x a week since he is taking it at night Will switch to the morning.  decrease fatty foods increase activity.   Morbid obesity ([place]) - follow up 3 months for progress monitoring - increase veggies, decrease carbs - long discussion about weight loss, diet, and exercise  Medication management -     Magnesium  Vitamin D deficiency -     Vitamin D (25 hydroxy)  Smoker Will be elidgable for low dose CT scan next year, will not get CXR this year and try to get low dose CT screen - willing to do in July, will set up with CPE Smoking cessation-  instruction/counseling given, counseled patient on the dangers of tobacco use, advised patient to stop smoking, and reviewed strategies to maximize success, patient not ready to quit at this time.   Attention deficit hyperactivity disorder (ADHD), unspecified ADHD type -     amphetamine-dextroamphetamine (ADDERALL) 20 MG tablet; Take 1 tablet (20 mg total) by mouth 2 (two) times daily.     Continue diet and meds as discussed. Further disposition pending results of labs. Discussed med's effects and SE's.   Over 30 minutes of exam, counseling, chart review, and critical decision making was performed.   Future Appointments  Date Time Provider [place]  12/05/2019  9:00 AM  Vicie Mutters, PA-C [facility] None    ------------------------------------------------------ ----------------------------------------------------------------  HPI 53 y.o. male  presents for 3 month follow up on hypertension, cholesterol, diabetes, weight and vitamin D deficiency.   He does continue to smoke, states he tried the chantix, no AE's but it did not help. He does not use the breo daily, long discussion about emergency versus maintenance inhaler. Will refill both.  CXR 03/2018- will wait until age 37 40 pack year smoking history, can discuss low dose CT age 23.   BMI is Body mass index is 30.99 kg/m., he has not been working on diet and exercise. Wt Readings from Last 3 Encounters:  08/22/19 216 lb (98 kg)  04/04/19 213 lb (96.6 kg)  11/29/18 217 lb 9.6 oz (98.7 kg)    His blood pressure has been controlled at home, today their BP is BP: 120/72  He does not workout. He denies chest pain, shortness of breath, dizziness.   He is on cholesterol medication Atorvastatin 80 mg and denies myalgias. His cholesterol is at goal. His good cholesterol is low and not at goal. The cholesterol last visit was:   Lab Results  Component Value Date   CHOL 145 04/04/2019   HDL 37 (L) 04/04/2019   LDLCALC 88 04/04/2019   TRIG 105 04/04/2019   CHOLHDL 3.9 04/04/2019    He has not been working on diet and exercise for prediabetes, and denies hyperglycemia, hypoglycemia , polydipsia and polyuria. Last A1C in the office was:  Lab Results  Component  Value Date   HGBA1C 5.4 11/29/2018   Patient is on Vitamin D supplement.   Lab Results  Component Value Date   VD25OH 46 04/04/2019        Current Medications:  Current Outpatient Medications on File Prior to Visit  Medication Sig  . albuterol (PROAIR HFA) 108 (90 Base) MCG/ACT inhaler Inhale 2 puffs into the lungs every 6 (six) hours as needed for wheezing or shortness of breath.  . amphetamine-dextroamphetamine (ADDERALL) 20 MG tablet  Take 1 tablet (20 mg total) by mouth 2 (two) times daily.  Marland Kitchen atorvastatin (LIPITOR) 80 MG tablet Take 1 tablet (80 mg total) by mouth daily.  . cetirizine (ZYRTEC) 10 MG tablet Take 10 mg by mouth daily as needed for allergies.   Marland Kitchen esomeprazole (NEXIUM) 40 MG capsule Take 1 capsule (40 mg total) by mouth 2 (two) times daily before a meal.  . ferrous sulfate 325 (65 FE) MG tablet Take 1 tablet (325 mg total) by mouth 2 (two) times daily. Patient needs office visit for further refills  . fluticasone furoate-vilanterol (BREO ELLIPTA) 100-25 MCG/INH AEPB Inhale 1 puff into the lungs daily. Rinse mouth with water after each use  . lidocaine (LIDODERM) 5 % Place 1 patch onto the skin daily. Remove & Discard patch within 12 hours or as directed by MD  . metoprolol tartrate (LOPRESSOR) 25 MG tablet Take 1 tablet (25 mg total) by mouth 2 (two) times daily.   Current Facility-Administered Medications on File Prior to Visit  Medication  . 0.9 %  sodium chloride infusion  . 0.9 %  sodium chloride infusion    Allergies: No Known Allergies   Medical History:  Past Medical History:  Diagnosis Date  . ADD (attention deficit disorder)   . Allergy   . Appendicitis   . Arthritis   . ASHD (arteriosclerotic heart disease)   . Cancer ([place])   . Colitis   . Crohn's disease ([place])   . Eczema   . GERD (gastroesophageal reflux disease)   . Hyperlipidemia   . Hypertension   . Left renal mass   . Morbid obesity ([place])   . Myocardial infarction [facility])    status post RCA stenting in [facility] 2011  . Positive for macroalbuminuria    Family history- Reviewed and unchanged Social history- Reviewed and unchanged  Review of Systems:  Review of Systems  Constitutional: Negative for chills, diaphoresis, fever, malaise/fatigue and weight loss.  HENT: Negative for congestion, ear discharge, ear pain, hearing loss, nosebleeds, sinus pain, sore throat and tinnitus.   Eyes: Negative for blurred vision, double  vision, photophobia, pain, discharge and redness.  Respiratory: Negative for cough, hemoptysis, sputum production, shortness of breath, wheezing and stridor.   Cardiovascular: Negative for chest pain, palpitations, orthopnea, claudication, leg swelling and PND.  Gastrointestinal: Negative for abdominal pain, blood in stool, constipation, diarrhea, heartburn, melena, nausea and vomiting.  Genitourinary: Negative for dysuria, flank pain, frequency, hematuria and urgency.  Musculoskeletal: Negative for back pain, falls, joint pain, myalgias and neck pain.  Skin: Negative for itching and rash.  Neurological: Negative for dizziness, tingling, tremors, sensory change, speech change, focal weakness, seizures, loss of consciousness, weakness and headaches.  Endo/Heme/Allergies: Negative for environmental allergies and polydipsia. Does not bruise/bleed easily.  Psychiatric/Behavioral: Negative for depression, hallucinations, memory loss, substance abuse and suicidal ideas. The patient is not nervous/anxious and does not have insomnia.     Physical Exam: BP 120/72   Pulse (!) 52   Temp (!)  97.5 F (36.4 C)   Ht 5' 10"  (1.778 m)   Wt 216 lb (98 kg)   SpO2 97%   BMI 30.99 kg/m  Wt Readings from Last 3 Encounters:  08/22/19 216 lb (98 kg)  04/04/19 213 lb (96.6 kg)  11/29/18 217 lb 9.6 oz (98.7 kg)   General Appearance: Well nourished, in no apparent distress. Eyes: PERRLA, EOMs, conjunctiva no swelling or erythema Sinuses: No Frontal/maxillary tenderness ENT/Mouth: Ext aud canals clear, TMs without erythema, bulging. No erythema, swelling, or exudate on post pharynx.  Tonsils not swollen or erythematous. Hearing normal.  Neck: Supple, thyroid normal.  Respiratory: Respiratory effort normal, BS equal bilaterally without rales, rhonchi, wheezing or stridor.  Cardio: RRR with no MRGs. Brisk peripheral pulses without edema.  Abdomen: Soft, + BS.  Non tender, no guarding, rebound, hernias,  masses. Lymphatics: Non tender without lymphadenopathy.  Musculoskeletal: Full ROM, 5/5 strength, Normal gait Skin: Warm, dry without rashes, lesions, ecchymosis.  Neuro: Cranial nerves intact. No cerebellar symptoms.  Psych: Awake and oriented X 3, normal affect, Insight and Judgment appropriate.   Vicie Mutters, PA-C 11:05 AM [facility]

## 2019-08-22 ENCOUNTER — Encounter: Payer: Self-pay | Admitting: Physician Assistant

## 2019-08-22 ENCOUNTER — Other Ambulatory Visit: Payer: Self-pay

## 2019-08-22 ENCOUNTER — Ambulatory Visit: Payer: Federal, State, Local not specified - PPO | Admitting: Physician Assistant

## 2019-08-22 VITALS — BP 120/72 | HR 52 | Temp 97.5°F | Ht 70.0 in | Wt 216.0 lb

## 2019-08-22 DIAGNOSIS — E559 Vitamin D deficiency, unspecified: Secondary | ICD-10-CM | POA: Diagnosis not present

## 2019-08-22 DIAGNOSIS — Z85528 Personal history of other malignant neoplasm of kidney: Secondary | ICD-10-CM

## 2019-08-22 DIAGNOSIS — K50019 Crohn's disease of small intestine with unspecified complications: Secondary | ICD-10-CM | POA: Diagnosis not present

## 2019-08-22 DIAGNOSIS — Z79899 Other long term (current) drug therapy: Secondary | ICD-10-CM | POA: Diagnosis not present

## 2019-08-22 DIAGNOSIS — F17200 Nicotine dependence, unspecified, uncomplicated: Secondary | ICD-10-CM

## 2019-08-22 DIAGNOSIS — C642 Malignant neoplasm of left kidney, except renal pelvis: Secondary | ICD-10-CM | POA: Insufficient documentation

## 2019-08-22 DIAGNOSIS — E785 Hyperlipidemia, unspecified: Secondary | ICD-10-CM | POA: Diagnosis not present

## 2019-08-22 DIAGNOSIS — I700 Atherosclerosis of aorta: Secondary | ICD-10-CM

## 2019-08-22 DIAGNOSIS — F909 Attention-deficit hyperactivity disorder, unspecified type: Secondary | ICD-10-CM

## 2019-08-22 DIAGNOSIS — I10 Essential (primary) hypertension: Secondary | ICD-10-CM | POA: Diagnosis not present

## 2019-08-22 HISTORY — DX: Malignant neoplasm of left kidney, except renal pelvis: C64.2

## 2019-08-22 MED ORDER — AMPHETAMINE-DEXTROAMPHETAMINE 20 MG PO TABS: 20.0000 mg | ORAL_TABLET | Freq: Two times a day (BID) | ORAL | 0 refills | Status: DC

## 2019-08-22 NOTE — Patient Instructions (Signed)
Your LDL is not in range or at goal, goal is less than 70.  Your LDL is the bad cholesterol that can lead to heart attack and stroke. To lower your number you can decrease your fatty foods, red meat, cheese, milk and increase fiber like whole grains and veggies. You can also add a fiber supplement like Citracel or Benefiber, these do not cause gas and bloating and are safe to use. Since you have risk factors that make [place] want your number below 70, we need to adjust or add medications to get your number below goal.   Here is some information to help you keep your heart healthy: Move it! - Aim for 30 mins of activity every day. Take it slowly at first. Talk to [place] before starting any new exercise program.   Lose it.  -Body Mass Index (BMI) can indicate if you need to lose weight. A healthy range is 18.5-24.9. For a BMI calculator, go to [employer]  Waist Management -Excess abdominal fat is a risk factor for heart disease, diabetes, asthma, stroke and more. Ideal waist circumference is less than 35" for women and less than 40" for men.   Eat Right -focus on fruits, vegetables, whole grains, and meals you make yourself. Avoid foods with trans fat and high sugar/sodium content.   Snooze or Snore? - Loud snoring can be a sign of sleep apnea, a significant risk factor for high blood pressure, heart attach, stroke, and heart arrhythmias.  Kick the habit -Quit Smoking! Avoid second hand smoke. A single cigarette raises your blood pressure for 20 mins and increases the risk of heart attack and stroke for the next 24 hours.   Are Aspirin and Supplements right for you? -Add ENTERIC COATED low dose 81 mg Aspirin daily OR can do every other day if you have easy bruising to protect your heart and head. As well as to reduce risk of Colon Cancer by 20 %, Skin Cancer by 26 % , Melanoma by 46% and Pancreatic cancer by 60%  Say "No to Stress -There may be little you can do about problems that cause stress.  However, techniques such as long walks, meditation, and exercise can help you manage it.   Start Now! - Make changes one at a time and set reasonable goals to increase your likelihood of success.

## 2019-08-23 LAB — CBC WITH DIFFERENTIAL/PLATELET
Absolute Monocytes: 621 cells/uL (ref 200–950)
Basophils Absolute: 63 cells/uL (ref 0–200)
Basophils Relative: 0.7 %
Eosinophils Absolute: 198 cells/uL (ref 15–500)
Eosinophils Relative: 2.2 %
HCT: 42.3 % (ref 38.5–50.0)
Hemoglobin: 14.2 g/dL (ref 13.2–17.1)
Lymphs Abs: 1395 cells/uL (ref 850–3900)
MCH: 30.9 pg (ref 27.0–33.0)
MCHC: 33.6 g/dL (ref 32.0–36.0)
MCV: 92 fL (ref 80.0–100.0)
MPV: 10.7 fL (ref 7.5–12.5)
Monocytes Relative: 6.9 %
Neutro Abs: 6723 cells/uL (ref 1500–7800)
Neutrophils Relative %: 74.7 %
Platelets: 302 10*3/uL (ref 140–400)
RBC: 4.6 10*6/uL (ref 4.20–5.80)
RDW: 12.9 % (ref 11.0–15.0)
Total Lymphocyte: 15.5 %
WBC: 9 10*3/uL (ref 3.8–10.8)

## 2019-08-23 LAB — LIPID PANEL
Cholesterol: 104 mg/dL (ref <200)
HDL: 34 mg/dL — ABNORMAL LOW (ref >=40)
LDL Cholesterol (Calc): 56 mg/dL (calc)
Non-HDL Cholesterol (Calc): 70 mg/dL (calc) (ref <130)
Total CHOL/HDL Ratio: 3.1 (calc) (ref <5.0)
Triglycerides: 64 mg/dL (ref <150)

## 2019-08-23 LAB — COMPLETE METABOLIC PANEL WITH GFR
AG Ratio: 1.5 (calc) (ref 1.0–2.5)
ALT: 16 U/L (ref 9–46)
AST: 12 U/L (ref 10–35)
Albumin: 4 g/dL (ref 3.6–5.1)
Alkaline phosphatase (APISO): 105 U/L (ref 35–144)
BUN: 12 mg/dL (ref 7–25)
CO2: 24 mmol/L (ref 20–32)
Calcium: 9 mg/dL (ref 8.6–10.3)
Chloride: 107 mmol/L (ref 98–110)
Creat: 0.75 mg/dL (ref 0.70–1.33)
GFR, Est African American: 120 mL/min/{1.73_m2} (ref >=60)
GFR, Est Non African American: 103 mL/min/{1.73_m2} (ref >=60)
Globulin: 2.6 g/dL (calc) (ref 1.9–3.7)
Glucose, Bld: 82 mg/dL (ref 65–99)
Potassium: 4.4 mmol/L (ref 3.5–5.3)
Sodium: 138 mmol/L (ref 135–146)
Total Bilirubin: 0.3 mg/dL (ref 0.2–1.2)
Total Protein: 6.6 g/dL (ref 6.1–8.1)

## 2019-08-23 LAB — VITAMIN D 25 HYDROXY (VIT D DEFICIENCY, FRACTURES): Vit D, 25-Hydroxy: 50 ng/mL (ref 30–100)

## 2019-08-23 LAB — TSH: TSH: 1.41 mIU/L (ref 0.40–4.50)

## 2019-08-23 LAB — MAGNESIUM: Magnesium: 1.9 mg/dL (ref 1.5–2.5)

## 2019-08-29 ENCOUNTER — Encounter: Payer: Self-pay | Admitting: Adult Health

## 2019-08-29 ENCOUNTER — Other Ambulatory Visit: Payer: Self-pay

## 2019-08-29 ENCOUNTER — Ambulatory Visit: Payer: Federal, State, Local not specified - PPO | Admitting: Adult Health

## 2019-08-29 VITALS — BP 118/80 | HR 72 | Temp 97.7°F | Wt 217.4 lb

## 2019-08-29 DIAGNOSIS — J019 Acute sinusitis, unspecified: Secondary | ICD-10-CM

## 2019-08-29 MED ORDER — FLUTICASONE PROPIONATE 50 MCG/ACT NA SUSP: 2.0000 | Freq: Every day | NASAL | 1 refills | Status: DC

## 2019-08-29 MED ORDER — PROMETHAZINE-DM 6.25-15 MG/5ML PO SYRP: 5.0000 mL | ORAL_SOLUTION | Freq: Four times a day (QID) | ORAL | 1 refills | Status: DC | PRN

## 2019-08-29 MED ORDER — PREDNISONE 20 MG PO TABS: ORAL_TABLET | ORAL | 0 refills | Status: DC

## 2019-08-29 NOTE — Patient Instructions (Addendum)
   Covid 19 drive through   Switch zyrtec to allegra or fexofenadine   Add flonase/fluticasone    If not getting better in the next week - or with sig worse, fever/chills, severe sinus pain - message and will send in an antibiotic   HOW TO TREAT VIRAL COUGH AND COLD SYMPTOMS:  -Symptoms usually last at least 1 week with the worst symptoms being around day 4.  - colds usually start with a sore throat and end with a cough, and the cough can take 2 weeks to get better.  -No antibiotics are needed for colds, flu, sore throats, cough, bronchitis UNLESS symptoms are longer than 7 days OR if you are getting better then get drastically worse.  -There are a lot of combination medications (Dayquil, Nyquil, Vicks 44, tyelnol cold and sinus, ETC). Please look at the ingredients on the back so that you are treating the correct symptoms and not doubling up on medications/ingredients.    Medicines you can use  Nasal congestion  Little Remedies saline spray (aerosol/mist)- can try this, it is in the kids section - pseudoephedrine (Sudafed)- behind the counter, do not use if you have high blood pressure, medicine that have -D in them.  - phenylephrine (Sudafed PE) -Dextormethorphan + chlorpheniramine (Coridcidin HBP)- okay if you have high blood pressure -Oxymetazoline (Afrin) nasal spray- LIMIT to 3 days -Saline nasal spray -Neti pot (used distilled or bottled water)  Ear pain/congestion  -pseudoephedrine (sudafed) - Nasonex/flonase nasal spray  Fever  -Acetaminophen (Tyelnol) -Ibuprofen (Advil, motrin, aleve)  Sore Throat  -Acetaminophen (Tyelnol) -Ibuprofen (Advil, motrin, aleve) -Drink a lot of water -Gargle with salt water - Rest your voice (don't talk) -Throat sprays -Cough drops  Body Aches  -Acetaminophen (Tyelnol) -Ibuprofen (Advil, motrin, aleve)  Headache  -Acetaminophen (Tyelnol) -Ibuprofen (Advil, motrin, aleve) - Exedrin, Exedrin Migraine  Allergy symptoms (cough,  sneeze, runny nose, itchy eyes) -Claritin or loratadine cheapest but likely the weakest  -Zyrtec or certizine at night because it can make you sleepy -The strongest is allegra or fexafinadine  Cheapest at [employer], [employer], [employer]  Cough  -Dextromethorphan (Delsym)- medicine that has DM in it -Guafenesin (Mucinex/Robitussin) - cough drops - drink lots of water  Chest Congestion  -Guafenesin (Mucinex/Robitussin)  Red Itchy Eyes  - Naphcon-A  Upset Stomach  - Bland diet (nothing spicy, greasy, fried, and high acid foods like tomatoes, oranges, berries) -OKAY- cereal, bread, soup, crackers, rice -Eat smaller more frequent meals -reduce caffeine, no alcohol -Loperamide (Imodium-AD) if diarrhea -Prevacid for heart burn  General health when sick  -Hydration -wash your hands frequently -keep surfaces clean -change pillow cases and sheets often -Get fresh air but do not exercise strenuously -Vitamin D, double up on it - Vitamin C -Zinc

## 2019-08-29 NOTE — Progress Notes (Signed)
Assessment and Plan:  Cole [name] was seen today for sinus problem.  Diagnoses and all orders for this visit:  Acute rhinosinusitis Benign hx and exam; most suggestive of allergic rhinosinusitis, possible viral  Did encourage covid 19 drive through testing Discussed the importance of avoiding unnecessary antibiotic therapy. Suggested symptomatic OTC remedies. Nasal saline spray for congestion. Nasal steroids, rotate allergy pill (allegra suggested), oral steroids offered STOP smoking Follow up as needed if no improvement in 1 week or with significant worsening of sx despite current treatment, consider superimposed bacterial infection and will start abx Follow up with any new/worsening sx -     fluticasone (FLONASE) 50 MCG/ACT nasal spray; Place 2 sprays into both nostrils at bedtime. -     predniSONE (DELTASONE) 20 MG tablet; 2 tablets daily for 3 days, 1 tablet daily for 4 days.  Other orders -     promethazine-dextromethorphan (PROMETHAZINE-DM) 6.25-15 MG/5ML syrup; Take 5 mLs by mouth 4 (four) times daily as needed for cough.  Further disposition pending results of labs. Discussed med's effects and SE's.   Over 30 minutes of exam, counseling, chart review, and critical decision making was performed.   Future Appointments  Date Time Provider [place]  08/29/2019  2:30 PM Liane Comber, NP [facility] None  12/05/2019  9:00 AM Vicie Mutters, PA-C [facility] None    ------------------------------------------------------------------------------------------------------------------   HPI Wt 217 lb 6.4 oz (98.6 kg)   BMI 31.19 kg/m   54 y.o.male, current smoker with hx of allergic rhinitis presents for evaluation of 3 days of URI sx.   He reports 3 days ago started developing nasal congestion and runny nose, post-nasal drip, and vaguely feeling unwell, denies sore throat but started feeling tickle in throat, dry cough that seems to be settling in chest, some rattling and  wheezing last night. Denies CP, dypsnea. Denies HA, dizziness, fever/chills, GI sx.   Takes zyrtec 10 mg year around, typically worst in Spring, on Breo/PRN albuterol, takes only as needed, started a few days ago, has been helping. He reports he started on mucinex which has helped with runny nose. He reports symptoms are stable/similar over the last 3 days. No known sick contacts. He has not had covid 19 vaccine.     Past Medical History:  Diagnosis Date  . ADD (attention deficit disorder)   . Allergy   . Appendicitis   . Arthritis   . ASHD (arteriosclerotic heart disease)   . Cancer ([place])   . Colitis   . Crohn's disease ([place])   . Eczema   . GERD (gastroesophageal reflux disease)   . Hyperlipidemia   . Hypertension   . Left renal mass   . Morbid obesity ([place])   . Myocardial infarction [facility])    status post RCA stenting in [facility] 2011  . Positive for macroalbuminuria   . Renal cell carcinoma, left ([place]) 08/22/2019   Dr. Risa Grill for left renal mass found during crohn's evaluation, had left partial nephrectomy 01/17/2015 and had + renal cell carcinoma      No Known Allergies  Current Outpatient Medications on File Prior to Visit  Medication Sig  . albuterol (PROAIR HFA) 108 (90 Base) MCG/ACT inhaler Inhale 2 puffs into the lungs every 6 (six) hours as needed for wheezing or shortness of breath.  . amphetamine-dextroamphetamine (ADDERALL) 20 MG tablet Take 1 tablet (20 mg total) by mouth 2 (two) times daily.  Marland Kitchen atorvastatin (LIPITOR) 80 MG tablet Take 1 tablet (80 mg total) by mouth daily.  Marland Kitchen  cetirizine (ZYRTEC) 10 MG tablet Take 10 mg by mouth daily as needed for allergies.   Marland Kitchen esomeprazole (NEXIUM) 40 MG capsule Take 1 capsule (40 mg total) by mouth 2 (two) times daily before a meal.  . ferrous sulfate 325 (65 FE) MG tablet Take 1 tablet (325 mg total) by mouth 2 (two) times daily. Patient needs office visit for further refills  . fluticasone furoate-vilanterol (BREO ELLIPTA)  100-25 MCG/INH AEPB Inhale 1 puff into the lungs daily. Rinse mouth with water after each use  . lidocaine (LIDODERM) 5 % Place 1 patch onto the skin daily. Remove & Discard patch within 12 hours or as directed by MD  . metoprolol tartrate (LOPRESSOR) 25 MG tablet Take 1 tablet (25 mg total) by mouth 2 (two) times daily.   Current Facility-Administered Medications on File Prior to Visit  Medication  . 0.9 %  sodium chloride infusion  . 0.9 %  sodium chloride infusion    ROS: all negative except above.   Physical Exam:  Wt 217 lb 6.4 oz (98.6 kg)   BMI 31.19 kg/m   General Appearance: Well nourished, in no apparent distress. Eyes: PERRLA, conjunctiva no swelling or erythema Sinuses: No Frontal/maxillary tenderness, generalized pressure ENT/Mouth: Ext aud canals clear, TMs without erythema, bulging. No erythema, swelling, or exudate on post pharynx.  Tonsils not swollen or erythematous. Hearing normal.  Neck: Supple, thyroid normal.  Respiratory: Respiratory effort normal, BS present throughout with centrilobular rhonchi without rales, wheezing or stridor.  Cardio: RRR with no MRGs. Brisk peripheral pulses without edema.  Abdomen: Soft, + BS.  Non tender Lymphatics: Non tender without lymphadenopathy.  Musculoskeletal: No obvious deformity, normal gait.  Skin: Warm, dry without rashes, lesions, ecchymosis.  Neuro:  Normal muscle tone Psych: Awake and oriented X 3, normal affect, Insight and Judgment appropriate.     Izora Ribas, NP 2:25 PM [facility]

## 2019-10-11 ENCOUNTER — Encounter: Payer: Federal, State, Local not specified - PPO | Admitting: Physician Assistant

## 2019-10-13 DIAGNOSIS — N200 Calculus of kidney: Secondary | ICD-10-CM | POA: Diagnosis not present

## 2019-10-13 DIAGNOSIS — Z6830 Body mass index (BMI) 30.0-30.9, adult: Secondary | ICD-10-CM | POA: Diagnosis not present

## 2019-10-13 DIAGNOSIS — R110 Nausea: Secondary | ICD-10-CM | POA: Diagnosis not present

## 2019-10-13 DIAGNOSIS — R109 Unspecified abdominal pain: Secondary | ICD-10-CM | POA: Diagnosis not present

## 2019-12-02 NOTE — Progress Notes (Signed)
Complete physical  Assessment and Plan:   Essential hypertension - continue medications, DASH diet, exercise and monitor at home. Call if greater than 130/80.  - CBC with Differential/Platelet - BASIC METABOLIC PANEL WITH GFR - Hepatic function panel - TSH   ASHD (arteriosclerotic heart disease) Control blood pressure, cholesterol, glucose, increase exercise.  Stop smoking   Hyperlipidemia -continue medications, check lipids, decrease fatty foods, increase activity.  - Lipid panel   Crohn's disease of ileum, unspecified complication ([place]) Continue GI follow up   Vitamin D deficiency - VITAMIN D 25 Hydroxy (Vit-D Deficiency, Fractures)   Medication management - Magnesium   Left renal mass A/p nephrectomy, follow up urology   ADD (attention deficit disorder) - amphetamine-dextroamphetamine (ADDERALL) 20 MG tablet; Take 1 tablet (20 mg total) by mouth 2 (two) times daily.  Dispense: 60 tablet; Refill: 0  Encounter for general adult medical examination with abnormal findings  Smoker Advised to quit smoking, not willing at this time.   Screening PSA (prostate specific antigen) -     PSA  Atherosclerosis of aorta (HCC) Control blood pressure, cholesterol, glucose, increase exercise.  -     EKG 12-Lead - aorta scan   Continue diet and meds as discussed. Further disposition pending results of labs. Future Appointments  Date Time Provider [place]  12/05/2020  9:00 AM Vicie Mutters, PA-C [facility] None    HPI 54 y.o. smoking whit male  presents for CPE and  3 month follow up with hypertension, hyperlipidemia, prediabetes, CAD s/p stent  and vitamin D.   His blood pressure has been controlled at home, today their BP is BP: 128/80  He does not workout but will shovel mulch AND is physically active.  He denies chest pain, shortness of breath, dizziness.  He does not use breo daily due to yeast, will use albuterol 1-2 x a month.   BMI is Body mass index is  32.17 kg/m., he is working on diet and exercise. Wt Readings from Last 3 Encounters:  12/05/19 221 lb (100.2 kg)  08/29/19 217 lb 6.4 oz (98.6 kg)  08/22/19 216 lb (98 kg)   Crohn's follows with Dr. [name].   He also follows with Dr. Risa Grill for left renal mass found during crohn's evaluation, had left partial nephrectomy 01/17/2015 and had + renal cell carcinoma following with urology.  He continues to smoke but would like to quit. Has done patches in the past and will likely try that. Granddaughter is 24 months old, going to have heart surgery for likely VSD. Has tried chantix without any help in the past.   He has a history of ASHD S/P Stenting in 2010.  He is on ASA.  Denies dyspnea, exertional chest pressure/discomfort and irregular heart beat.   He is on cholesterol medication, lipitor 19m and denies myalgias. His cholesterol is at goal. The cholesterol last visit was:   Lab Results  Component Value Date   CHOL 104 08/22/2019   HDL 34 (L) 08/22/2019   LDLCALC 56 08/22/2019   TRIG 64 08/22/2019   CHOLHDL 3.1 08/22/2019   He has been working on diet and exercise for prediabetes, and denies paresthesia of the feet, polydipsia, polyuria and visual disturbances. Last A1C in the office was:  Lab Results  Component Value Date   HGBA1C 5.4 11/29/2018  Patient is on Vitamin D supplement.   BMI is Body mass index is 32.17 kg/m., he is working on diet and exercise. Wt Readings from Last 3 Encounters:  12/05/19  221 lb (100.2 kg)  08/29/19 217 lb 6.4 oz (98.6 kg)  08/22/19 216 lb (98 kg)    Current Medications:    Current Outpatient Medications (Endocrine & Metabolic):    predniSONE (DELTASONE) 20 MG tablet, 2 tablets daily for 3 days, 1 tablet daily for 4 days.   Current Outpatient Medications (Cardiovascular):    atorvastatin (LIPITOR) 80 MG tablet, Take 1 tablet (80 mg total) by mouth daily.   metoprolol tartrate (LOPRESSOR) 25 MG tablet, Take 1 tablet (25 mg total) by mouth  2 (two) times daily.   Current Outpatient Medications (Respiratory):    albuterol (PROAIR HFA) 108 (90 Base) MCG/ACT inhaler, Inhale 2 puffs into the lungs every 6 (six) hours as needed for wheezing or shortness of breath.   cetirizine (ZYRTEC) 10 MG tablet, Take 10 mg by mouth daily as needed for allergies.    fluticasone (FLONASE) 50 MCG/ACT nasal spray, Place 2 sprays into both nostrils at bedtime.   fluticasone furoate-vilanterol (BREO ELLIPTA) 100-25 MCG/INH AEPB, Inhale 1 puff into the lungs daily. Rinse mouth with water after each use   promethazine-dextromethorphan (PROMETHAZINE-DM) 6.25-15 MG/5ML syrup, Take 5 mLs by mouth 4 (four) times daily as needed for cough.     Current Outpatient Medications (Hematological):    ferrous sulfate 325 (65 FE) MG tablet, Take 1 tablet (325 mg total) by mouth 2 (two) times daily. Patient needs office visit for further refills   Current Outpatient Medications (Other):    amphetamine-dextroamphetamine (ADDERALL) 20 MG tablet, Take 1 tablet (20 mg total) by mouth 2 (two) times daily.   esomeprazole (NEXIUM) 40 MG capsule, Take 1 capsule (40 mg total) by mouth 2 (two) times daily before a meal.   lidocaine (LIDODERM) 5 %, Place 1 patch onto the skin daily. Remove & Discard patch within 12 hours or as directed by MD  Current Facility-Administered Medications (Other):    0.9 %  sodium chloride infusion   0.9 %  sodium chloride infusion  Medical History:  Past Medical History:  Diagnosis Date   ADD (attention deficit disorder)    Allergy    Appendicitis    Arthritis    ASHD (arteriosclerotic heart disease)    Cancer (HCC)    Colitis    Crohn's disease ([place])    Eczema    GERD (gastroesophageal reflux disease)    Hyperlipidemia    Hypertension    Left renal mass    Morbid obesity ([place])    Myocardial infarction ([place])    status post RCA stenting in [place] 2011   Positive for macroalbuminuria    Renal cell  carcinoma, left ([place]) 08/22/2019   Dr. Risa Grill for left renal mass found during crohn's evaluation, had left partial nephrectomy 01/17/2015 and had + renal cell carcinoma    Allergies No Known Allergies  SURGICAL HISTORY He  has a past surgical history that includes Appendectomy; Coronary stent placement; Colon surgery; Robotic assited partial nephrectomy (Left, 01/17/2015); and Elbow surgery. FAMILY HISTORY His family history includes Crohn's disease in his paternal aunt and paternal uncle; Diabetes in his maternal grandmother. SOCIAL HISTORY He  reports that he has been smoking. He has a 30.00 pack-year smoking history. He has never used smokeless tobacco. He reports current alcohol use. He reports current drug use. Drug: Marijuana.   Immunization History  Administered Date(s) Administered   Td 05/19/2009   Health Maintenance  Topic Date Due   Hepatitis C Screening  Never done   HIV Screening  Never done  TETANUS/TDAP  05/20/2019   COVID-19 Vaccine (1) 12/21/2019 (Originally 07/10/1977)   COLONOSCOPY  06/08/2022   INFLUENZA VACCINE  Discontinued    Declines the influenza vaccine and COVID vaccines  Colonoscopy 05/2017 Dr. [name] due 5 years EGD: 04/2017 normal Ct AB pelvis 03/17/2014 CXR 03/2018 MRI AB 08/2014 Echo 12/2014  Dr. Sabra Heck eye, 2-3 months, 10/2017 yearly  Review of Systems:  Review of Systems  Constitutional: Negative.   HENT: Negative.   Eyes: Negative.   Respiratory: Negative for cough, hemoptysis, sputum production, shortness of breath and wheezing.   Cardiovascular: Negative.   Gastrointestinal: Negative.   Genitourinary: Negative.   Musculoskeletal: Negative.   Skin: Negative for rash.  Neurological: Negative.   Endo/Heme/Allergies: Negative.   Psychiatric/Behavioral: Negative.    Physical Exam: BP 128/80    Pulse 60    Temp (!) 97.4 F (36.3 C)    Ht 5' 9.5" (1.765 m)    Wt 221 lb (100.2 kg)    SpO2 96%    BMI 32.17 kg/m  Wt Readings  from Last 3 Encounters:  12/05/19 221 lb (100.2 kg)  08/29/19 217 lb 6.4 oz (98.6 kg)  08/22/19 216 lb (98 kg)   General Appearance: Well nourished, in no apparent distress. Eyes: PERRLA, EOMs, conjunctiva no swelling or erythema Sinuses: No Frontal/maxillary tenderness ENT/Mouth: Ext aud canals clear, TMs without erythema, bulging. No erythema, swelling, or exudate on post pharynx.  Tonsils not swollen or erythematous. Hearing normal.  Neck: Supple, thyroid normal.  Respiratory: Respiratory effort normal, BS equal bilaterally without rales, rhonchi, wheezing or stridor.  Cardio: RRR with no MRGs. Brisk peripheral pulses without edema.  Abdomen: Soft, + BS, obese,  Non tender, no guarding, rebound, hernias, masses. Lymphatics: Non tender without lymphadenopathy.  Musculoskeletal: Full ROM, 5/5 strength, Normal Skin: Several healed scars along AB. Warm, dry without rashes, lesions, ecchymosis.  Neuro: Cranial nerves intact. Normal muscle tone except some minor muscle wasting lateral right hand with decrease extension of 3 lateral fingers, normal distal vascular, no cerebellar symptoms. Psych: Awake and oriented X 3, normal affect, Insight and Judgment appropriate.   EKG IRBBB, no ST changes, 1st degree Av block, rate 76 Aorta Scan WNL  Vicie Mutters, PA-C 9:23 AM [facility]

## 2019-12-05 ENCOUNTER — Encounter: Payer: Self-pay | Admitting: Physician Assistant

## 2019-12-05 ENCOUNTER — Other Ambulatory Visit: Payer: Self-pay

## 2019-12-05 ENCOUNTER — Ambulatory Visit: Payer: Federal, State, Local not specified - PPO | Admitting: Physician Assistant

## 2019-12-05 VITALS — BP 128/80 | HR 60 | Temp 97.4°F | Ht 69.5 in | Wt 221.0 lb

## 2019-12-05 DIAGNOSIS — Z0000 Encounter for general adult medical examination without abnormal findings: Secondary | ICD-10-CM | POA: Diagnosis not present

## 2019-12-05 DIAGNOSIS — Z13220 Encounter for screening for lipoid disorders: Secondary | ICD-10-CM

## 2019-12-05 DIAGNOSIS — Z0001 Encounter for general adult medical examination with abnormal findings: Secondary | ICD-10-CM

## 2019-12-05 DIAGNOSIS — Z23 Encounter for immunization: Secondary | ICD-10-CM | POA: Diagnosis not present

## 2019-12-05 DIAGNOSIS — F909 Attention-deficit hyperactivity disorder, unspecified type: Secondary | ICD-10-CM

## 2019-12-05 DIAGNOSIS — Z131 Encounter for screening for diabetes mellitus: Secondary | ICD-10-CM | POA: Diagnosis not present

## 2019-12-05 DIAGNOSIS — Z1389 Encounter for screening for other disorder: Secondary | ICD-10-CM | POA: Diagnosis not present

## 2019-12-05 DIAGNOSIS — I700 Atherosclerosis of aorta: Secondary | ICD-10-CM | POA: Diagnosis not present

## 2019-12-05 DIAGNOSIS — D509 Iron deficiency anemia, unspecified: Secondary | ICD-10-CM

## 2019-12-05 DIAGNOSIS — Z125 Encounter for screening for malignant neoplasm of prostate: Secondary | ICD-10-CM | POA: Diagnosis not present

## 2019-12-05 DIAGNOSIS — F988 Other specified behavioral and emotional disorders with onset usually occurring in childhood and adolescence: Secondary | ICD-10-CM

## 2019-12-05 DIAGNOSIS — E559 Vitamin D deficiency, unspecified: Secondary | ICD-10-CM

## 2019-12-05 DIAGNOSIS — I2510 Atherosclerotic heart disease of native coronary artery without angina pectoris: Secondary | ICD-10-CM

## 2019-12-05 DIAGNOSIS — F17200 Nicotine dependence, unspecified, uncomplicated: Secondary | ICD-10-CM

## 2019-12-05 DIAGNOSIS — Z85528 Personal history of other malignant neoplasm of kidney: Secondary | ICD-10-CM

## 2019-12-05 DIAGNOSIS — E291 Testicular hypofunction: Secondary | ICD-10-CM

## 2019-12-05 DIAGNOSIS — I10 Essential (primary) hypertension: Secondary | ICD-10-CM | POA: Diagnosis not present

## 2019-12-05 DIAGNOSIS — Z79899 Other long term (current) drug therapy: Secondary | ICD-10-CM

## 2019-12-05 DIAGNOSIS — K50019 Crohn's disease of small intestine with unspecified complications: Secondary | ICD-10-CM

## 2019-12-05 DIAGNOSIS — Z130 Encounter for screening for diseases of the blood and blood-forming organs and certain disorders involving the immune mechanism: Secondary | ICD-10-CM

## 2019-12-05 DIAGNOSIS — Z91030 Bee allergy status: Secondary | ICD-10-CM

## 2019-12-05 DIAGNOSIS — Z1329 Encounter for screening for other suspected endocrine disorder: Secondary | ICD-10-CM

## 2019-12-05 DIAGNOSIS — Z136 Encounter for screening for cardiovascular disorders: Secondary | ICD-10-CM

## 2019-12-05 DIAGNOSIS — N401 Enlarged prostate with lower urinary tract symptoms: Secondary | ICD-10-CM | POA: Diagnosis not present

## 2019-12-05 DIAGNOSIS — R350 Frequency of micturition: Secondary | ICD-10-CM | POA: Diagnosis not present

## 2019-12-05 DIAGNOSIS — Z87442 Personal history of urinary calculi: Secondary | ICD-10-CM | POA: Insufficient documentation

## 2019-12-05 DIAGNOSIS — E785 Hyperlipidemia, unspecified: Secondary | ICD-10-CM

## 2019-12-05 MED ORDER — METOPROLOL TARTRATE 25 MG PO TABS: 25.0000 mg | ORAL_TABLET | Freq: Two times a day (BID) | ORAL | 1 refills | Status: DC

## 2019-12-05 MED ORDER — AMPHETAMINE-DEXTROAMPHETAMINE 20 MG PO TABS: 20.0000 mg | ORAL_TABLET | Freq: Two times a day (BID) | ORAL | 0 refills | Status: DC

## 2019-12-05 MED ORDER — EPINEPHRINE 0.3 MG/0.3ML IJ SOAJ: 0.3000 mg | Freq: Once | INTRAMUSCULAR | 1 refills | Status: AC

## 2019-12-05 NOTE — Patient Instructions (Signed)
[place]  [employer]  [phone] for more information or for a free program for smoking cessation help.   You can call QUIT SMART 1-800-QUIT-NOW for free nicotine patches or replacement therapy- if they are out- keep calling  [place] [facility] Can call for smoking cessation classes, [phone]  If you have a smart phone, please look up Smoke Free app, this will help you stay on track and give you information about money you have saved, life that you have gained back and a ton of more information.   We are giving you chantix for smoking cessation. You can do it! And we are here to help! You may have heard some scary side effects about chantix, the three most common I hear about are nausea, crazy dreams and depression.  However, I like for my patients to try to stay on 1/2 a tablet twice a day rather than one tablet twice a day as normally prescribed. This helps decrease the chances of side effects and helps save money by making a one month prescription last two months  Please start the prescription this way:  Start 1/2 tablet by mouth once daily after food with a full glass of water for 3 days Then do 1/2 tablet by mouth twice daily for 4 days. During this first week you can smoke, but try to stop after this week.  At this point we have several options: 1) continue on 1/2 tablet twice a day- which I encourage you to do. You can stay on this dose the rest of the time on the medication or if you still feel the need to smoke you can do one of the two options below. 2) do one tablet in the morning and 1/2 in the evening which helps decrease dreams. 3) do one tablet twice a day.   What if I miss a dose? If you miss a dose, take it as soon as you can. If it is almost time for your next dose, take only that dose. Do not take double or extra doses.  What should I watch for while using this medicine? Visit your doctor or health care professional for  regular check ups. Ask for ongoing advice and encouragement from your doctor or healthcare professional, friends, and family to help you quit. If you smoke while on this medication, quit again  Your mouth may get dry. Chewing sugarless gum or hard candy, and drinking plenty of water may help. Contact your doctor if the problem does not go away or is severe.  You may get drowsy or dizzy. Do not drive, use machinery, or do anything that needs mental alertness until you know how this medicine affects you. Do not stand or sit up quickly, especially if you are an older patient.   The use of this medicine may increase the chance of suicidal thoughts or actions. Pay special attention to how you are responding while on this medicine. Any worsening of mood, or thoughts of suicide or dying should be reported to your health care professional right away.  ADVANTAGES OF QUITTING SMOKING  Within 20 minutes, blood pressure decreases. Your pulse is at normal level.  After 8 hours, carbon monoxide levels in the blood return to normal. Your oxygen level increases.  After 24 hours, the chance of having a heart attack starts to decrease. Your breath, hair, and body stop smelling like smoke.  After 48 hours, damaged nerve endings begin to recover. Your sense of taste and  smell improve.  After 72 hours, the body is virtually free of nicotine. Your bronchial tubes relax and breathing becomes easier.  After 2 to 12 weeks, lungs can hold more air. Exercise becomes easier and circulation improves.  After 1 year, the risk of coronary heart disease is cut in half.  After 5 years, the risk of stroke falls to the same as a nonsmoker.  After 10 years, the risk of lung cancer is cut in half and the risk of other cancers decreases significantly.  After 15 years, the risk of coronary heart disease drops, usually to the level of a nonsmoker.  You will have extra money to spend on things other than cigarettes.

## 2019-12-06 LAB — COMPLETE METABOLIC PANEL WITH GFR
AG Ratio: 1.3 (calc) (ref 1.0–2.5)
ALT: 22 U/L (ref 9–46)
AST: 18 U/L (ref 10–35)
Albumin: 3.9 g/dL (ref 3.6–5.1)
Alkaline phosphatase (APISO): 112 U/L (ref 35–144)
BUN: 15 mg/dL (ref 7–25)
CO2: 24 mmol/L (ref 20–32)
Calcium: 9.2 mg/dL (ref 8.6–10.3)
Chloride: 107 mmol/L (ref 98–110)
Creat: 0.82 mg/dL (ref 0.70–1.33)
GFR, Est African American: 115 mL/min/{1.73_m2} (ref >=60)
GFR, Est Non African American: 100 mL/min/{1.73_m2} (ref >=60)
Globulin: 3.1 g/dL (calc) (ref 1.9–3.7)
Glucose, Bld: 89 mg/dL (ref 65–99)
Potassium: 4.4 mmol/L (ref 3.5–5.3)
Sodium: 138 mmol/L (ref 135–146)
Total Bilirubin: 0.3 mg/dL (ref 0.2–1.2)
Total Protein: 7 g/dL (ref 6.1–8.1)

## 2019-12-06 LAB — URINALYSIS, ROUTINE W REFLEX MICROSCOPIC
Bilirubin Urine: NEGATIVE
Glucose, UA: NEGATIVE
Hgb urine dipstick: NEGATIVE
Ketones, ur: NEGATIVE
Leukocytes,Ua: NEGATIVE
Nitrite: NEGATIVE
Protein, ur: NEGATIVE
Specific Gravity, Urine: 1.012 (ref 1.001–1.03)
pH: <=5 (ref 5.0–8.0)

## 2019-12-06 LAB — MAGNESIUM: Magnesium: 1.9 mg/dL (ref 1.5–2.5)

## 2019-12-06 LAB — CBC WITH DIFFERENTIAL/PLATELET
Absolute Monocytes: 636 cells/uL (ref 200–950)
Basophils Absolute: 43 cells/uL (ref 0–200)
Basophils Relative: 0.5 %
Eosinophils Absolute: 206 cells/uL (ref 15–500)
Eosinophils Relative: 2.4 %
HCT: 41.6 % (ref 38.5–50.0)
Hemoglobin: 14.1 g/dL (ref 13.2–17.1)
Lymphs Abs: 1359 cells/uL (ref 850–3900)
MCH: 31.5 pg (ref 27.0–33.0)
MCHC: 33.9 g/dL (ref 32.0–36.0)
MCV: 92.9 fL (ref 80.0–100.0)
MPV: 10.5 fL (ref 7.5–12.5)
Monocytes Relative: 7.4 %
Neutro Abs: 6355 cells/uL (ref 1500–7800)
Neutrophils Relative %: 73.9 %
Platelets: 307 10*3/uL (ref 140–400)
RBC: 4.48 10*6/uL (ref 4.20–5.80)
RDW: 12.9 % (ref 11.0–15.0)
Total Lymphocyte: 15.8 %
WBC: 8.6 10*3/uL (ref 3.8–10.8)

## 2019-12-06 LAB — TESTOSTERONE: Testosterone: 484 ng/dL (ref 250–827)

## 2019-12-06 LAB — PSA: PSA: 0.4 ng/mL (ref <=4.0)

## 2019-12-06 LAB — LIPID PANEL
Cholesterol: 101 mg/dL (ref <200)
HDL: 36 mg/dL — ABNORMAL LOW (ref >=40)
LDL Cholesterol (Calc): 47 mg/dL (calc)
Non-HDL Cholesterol (Calc): 65 mg/dL (calc) (ref <130)
Total CHOL/HDL Ratio: 2.8 (calc) (ref <5.0)
Triglycerides: 96 mg/dL (ref <150)

## 2019-12-06 LAB — VITAMIN D 25 HYDROXY (VIT D DEFICIENCY, FRACTURES): Vit D, 25-Hydroxy: 62 ng/mL (ref 30–100)

## 2019-12-06 LAB — HEMOGLOBIN A1C
Hgb A1c MFr Bld: 5.3 % of total Hgb (ref <5.7)
Mean Plasma Glucose: 105 (calc)
eAG (mmol/L): 5.8 (calc)

## 2019-12-06 LAB — MICROALBUMIN / CREATININE URINE RATIO
Creatinine, Urine: 77 mg/dL (ref 20–320)
Microalb Creat Ratio: 29 mcg/mg creat (ref <30)
Microalb, Ur: 2.2 mg/dL

## 2019-12-06 LAB — VITAMIN B12: Vitamin B-12: 444 pg/mL (ref 200–1100)

## 2019-12-06 LAB — IRON, TOTAL/TOTAL IRON BINDING CAP
%SAT: 18 % (calc) — ABNORMAL LOW (ref 20–48)
Iron: 57 ug/dL (ref 50–180)
TIBC: 312 mcg/dL (calc) (ref 250–425)

## 2019-12-06 LAB — TSH: TSH: 1.86 mIU/L (ref 0.40–4.50)

## 2019-12-06 LAB — FERRITIN: Ferritin: 185 ng/mL (ref 38–380)

## 2019-12-26 ENCOUNTER — Ambulatory Visit
Admission: RE | Admit: 2019-12-26 | Discharge: 2019-12-26 | Disposition: A | Discharge status: Home / Self Care | Payer: Federal, State, Local not specified - PPO | Source: Ambulatory Visit | Attending: Physician Assistant | Admitting: Physician Assistant

## 2019-12-26 DIAGNOSIS — Z0001 Encounter for general adult medical examination with abnormal findings: Secondary | ICD-10-CM

## 2019-12-26 DIAGNOSIS — F17200 Nicotine dependence, unspecified, uncomplicated: Secondary | ICD-10-CM

## 2019-12-26 DIAGNOSIS — Z87891 Personal history of nicotine dependence: Secondary | ICD-10-CM | POA: Diagnosis not present

## 2019-12-28 ENCOUNTER — Encounter: Payer: Self-pay | Admitting: Physician Assistant

## 2019-12-28 DIAGNOSIS — D3502 Benign neoplasm of left adrenal gland: Secondary | ICD-10-CM | POA: Insufficient documentation

## 2019-12-28 DIAGNOSIS — J439 Emphysema, unspecified: Secondary | ICD-10-CM | POA: Insufficient documentation

## 2020-01-05 ENCOUNTER — Telehealth: Payer: Self-pay | Admitting: Physician Assistant

## 2020-01-05 NOTE — Telephone Encounter (Signed)
please call to discuss ct scan results.

## 2020-02-24 ENCOUNTER — Other Ambulatory Visit: Payer: Self-pay | Admitting: Adult Health

## 2020-02-24 ENCOUNTER — Telehealth: Payer: Self-pay

## 2020-02-24 DIAGNOSIS — F909 Attention-deficit hyperactivity disorder, unspecified type: Secondary | ICD-10-CM

## 2020-02-24 MED ORDER — AMPHETAMINE-DEXTROAMPHETAMINE 20 MG PO TABS: 20.0000 mg | ORAL_TABLET | Freq: Two times a day (BID) | ORAL | 0 refills | Status: DC

## 2020-02-24 NOTE — Telephone Encounter (Signed)
Refill request for Adderall

## 2020-02-24 NOTE — Progress Notes (Signed)
Future Appointments  Date Time Provider [place]  06/18/2020  8:45 AM Garnet Sierras, NP [facility] None  12/05/2020  9:00 AM Garnet Sierras, NP [facility] None   PDMP checked for adderall refill request.

## 2020-02-28 ENCOUNTER — Other Ambulatory Visit: Payer: Self-pay | Admitting: Adult Health

## 2020-02-28 DIAGNOSIS — F909 Attention-deficit hyperactivity disorder, unspecified type: Secondary | ICD-10-CM

## 2020-04-17 ENCOUNTER — Other Ambulatory Visit: Payer: Self-pay | Admitting: Internal Medicine

## 2020-04-17 DIAGNOSIS — E785 Hyperlipidemia, unspecified: Secondary | ICD-10-CM

## 2020-04-17 DIAGNOSIS — I2510 Atherosclerotic heart disease of native coronary artery without angina pectoris: Secondary | ICD-10-CM

## 2020-04-17 MED ORDER — ATORVASTATIN CALCIUM 80 MG PO TABS: ORAL_TABLET | ORAL | 0 refills | Status: DC

## 2020-06-11 ENCOUNTER — Ambulatory Visit: Payer: Federal, State, Local not specified - PPO | Admitting: Adult Health Nurse Practitioner

## 2020-06-18 ENCOUNTER — Ambulatory Visit: Payer: Federal, State, Local not specified - PPO | Admitting: Adult Health Nurse Practitioner

## 2020-06-18 ENCOUNTER — Other Ambulatory Visit: Payer: Self-pay

## 2020-06-18 ENCOUNTER — Encounter: Payer: Self-pay | Admitting: Adult Health Nurse Practitioner

## 2020-06-18 VITALS — BP 132/80 | HR 67 | Temp 97.7°F | Ht 69.0 in | Wt 225.0 lb

## 2020-06-18 DIAGNOSIS — I2510 Atherosclerotic heart disease of native coronary artery without angina pectoris: Secondary | ICD-10-CM

## 2020-06-18 DIAGNOSIS — E559 Vitamin D deficiency, unspecified: Secondary | ICD-10-CM

## 2020-06-18 DIAGNOSIS — I700 Atherosclerosis of aorta: Secondary | ICD-10-CM

## 2020-06-18 DIAGNOSIS — E785 Hyperlipidemia, unspecified: Secondary | ICD-10-CM

## 2020-06-18 DIAGNOSIS — Z79899 Other long term (current) drug therapy: Secondary | ICD-10-CM

## 2020-06-18 DIAGNOSIS — F909 Attention-deficit hyperactivity disorder, unspecified type: Secondary | ICD-10-CM

## 2020-06-18 DIAGNOSIS — I10 Essential (primary) hypertension: Secondary | ICD-10-CM | POA: Diagnosis not present

## 2020-06-18 DIAGNOSIS — F17200 Nicotine dependence, unspecified, uncomplicated: Secondary | ICD-10-CM

## 2020-06-18 DIAGNOSIS — K50019 Crohn's disease of small intestine with unspecified complications: Secondary | ICD-10-CM

## 2020-06-18 MED ORDER — METOPROLOL TARTRATE 25 MG PO TABS: 25.0000 mg | ORAL_TABLET | Freq: Two times a day (BID) | ORAL | 2 refills | Status: DC

## 2020-06-18 MED ORDER — ALBUTEROL SULFATE HFA 108 (90 BASE) MCG/ACT IN AERS: 2.0000 | INHALATION_SPRAY | Freq: Four times a day (QID) | RESPIRATORY_TRACT | 3 refills | Status: DC | PRN

## 2020-06-18 MED ORDER — AMPHETAMINE-DEXTROAMPHETAMINE 20 MG PO TABS: 20.0000 mg | ORAL_TABLET | Freq: Two times a day (BID) | ORAL | 0 refills | Status: DC

## 2020-06-18 NOTE — Progress Notes (Signed)
FOLLOW UP 3 MONTH   Assessment and Plan:   Essential hypertension - continue medications, DASH diet, exercise and monitor at home. Call if greater than 130/80.  - CBC with Differential/Platelet - BASIC METABOLIC PANEL WITH GFR - Hepatic function panel - TSH   ASHD (arteriosclerotic heart disease) Control blood pressure, cholesterol, glucose, increase exercise.  Stop smoking   Hyperlipidemia -continue medications, check lipids, decrease fatty foods, increase activity.  - Lipid panel   Crohn's disease of ileum, unspecified complication ([place]) Continue GI follow up   Vitamin D deficiency - VITAMIN D 25 Hydroxy (Vit-D Deficiency, Fractures)   ADD (attention deficit disorder) - amphetamine-dextroamphetamine (ADDERALL) 20 MG tablet; Take 1 tablet (20 mg total) by mouth 2 (two) times daily.  Dispense: 60 tablet; Refill: 0  Smoker Advised to quit smoking, not willing at this time.  Will continue to assess readiness.   Yearly CT screening.   Medication management Continued   Call patient with lab results  Further disposition pending results if labs check today. Discussed med's effects and SE's.   Over 30 minutes of face to face interview, exam, counseling, chart review, and critical decision making was performed.     Future Appointments  Date Time Provider [place]  12/05/2020  9:00 AM Garnet Sierras, NP [facility] None    HPI 55 y.o. smoking whit male  3 month follow up with hypertension, hyperlipidemia, prediabetes, CAD s/p stent  and vitamin D.   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  He denies chest pain, shortness of breath, dizziness.   He does use breo daily, will use albuterol 1-2 x a month.   BMI is Body mass index is 33.23 kg/m., he is working on diet and exercise. Wt Readings from Last 3 Encounters:   06/18/20 225 lb (102.1 kg)  12/05/19 221 lb (100.2 kg)  08/29/19 217 lb 6.4 oz (98.6 kg)   Crohn's follows with Dr. [name]. No recent appointment going well, no flares.  He previously followed with Dr. Risa Grill for left renal mass found during crohn's evaluation, had left partial nephrectomy 01/17/2015 and had + renal cell carcinoma.  Has not had recent appointment.  He continues to smoke but would like to quit. Has done patches in the past and will likely try that. Granddaughter is 92 months old, going to have heart surgery for likely VSD. Has tried chantix without any help in the past.   He has a history of ASHD S/P Stenting in 2010.  He is on bASA.  Denies dyspnea, exertional chest pressure/discomfort and irregular heart beat.   He is on cholesterol medication, lipitor 37m and denies myalgias. His cholesterol is at goal. The cholesterol last visit was:   Lab Results  Component Value Date   CHOL 101 12/05/2019   HDL 36 (L) 12/05/2019   LDLCALC 47 12/05/2019   TRIG 96 12/05/2019   CHOLHDL 2.8 12/05/2019   He has been working on diet and exercise for prediabetes, and denies paresthesia of the feet, polydipsia, polyuria and visual disturbances. Last A1C in the office was:  Lab Results  Component Value Date   HGBA1C 5.3 12/05/2019  Patient is on Vitamin D supplement.   BMI is Body mass index is 33.23 kg/m., he is working on diet and exercise. Wt Readings from Last 3 Encounters:  06/18/20 225 lb (  102.1 kg)  12/05/19 221 lb (100.2 kg)  08/29/19 217 lb 6.4 oz (98.6 kg)    Current Medications:     Current Outpatient Medications (Cardiovascular):  .  atorvastatin (LIPITOR) 80 MG tablet, Take     1 tablet      Daily       for Cholesterol .  metoprolol tartrate (LOPRESSOR) 25 MG tablet, Take 1 tablet (25 mg total) by mouth 2 (two) times daily.  Current Outpatient Medications (Respiratory):  .  albuterol (PROAIR HFA) 108 (90 Base) MCG/ACT inhaler, Inhale 2 puffs into the lungs every 6  (six) hours as needed for wheezing or shortness of breath. .  cetirizine (ZYRTEC) 10 MG tablet, Take 10 mg by mouth daily as needed for allergies.  .  fluticasone (FLONASE) 50 MCG/ACT nasal spray, Place 2 sprays into both nostrils at bedtime. .  fluticasone furoate-vilanterol (BREO ELLIPTA) 100-25 MCG/INH AEPB, Inhale 1 puff into the lungs daily. Rinse mouth with water after each use   Current Outpatient Medications (Hematological):  .  ferrous sulfate 325 (65 FE) MG tablet, Take 1 tablet (325 mg total) by mouth 2 (two) times daily. Patient needs office visit for further refills  Current Outpatient Medications (Other):  .  amphetamine-dextroamphetamine (ADDERALL) 20 MG tablet, Take 1 tablet (20 mg total) by mouth 2 (two) times daily. Marland Kitchen  esomeprazole (NEXIUM) 40 MG capsule, Take 1 capsule (40 mg total) by mouth 2 (two) times daily before a meal. .  lidocaine (LIDODERM) 5 %, Place 1 patch onto the skin daily. Remove & Discard patch within 12 hours or as directed by MD  Medical History:  Past Medical History:  Diagnosis Date  . ADD (attention deficit disorder)   . Allergy   . Appendicitis   . Arthritis   . ASHD (arteriosclerotic heart disease)   . Cancer ([place])   . Colitis   . Crohn's disease ([place])   . Eczema   . GERD (gastroesophageal reflux disease)   . Hyperlipidemia   . Hypertension   . Left renal mass   . Morbid obesity ([place])   . Myocardial infarction [facility])    status post RCA stenting in [facility] 2011  . Positive for macroalbuminuria   . Renal cell carcinoma, left ([place]) 08/22/2019   Dr. Risa Grill for left renal mass found during crohn's evaluation, had left partial nephrectomy 01/17/2015 and had + renal cell carcinoma    Allergies No Known Allergies  SURGICAL HISTORY He  has a past surgical history that includes Appendectomy; Coronary stent placement; Colon surgery; Robotic assited partial nephrectomy (Left, 01/17/2015); and Elbow surgery. FAMILY HISTORY His family history  includes Crohn's disease in his paternal aunt and paternal uncle; Diabetes in his maternal grandmother. SOCIAL HISTORY He  reports that he has been smoking. He has a 30.00 pack-year smoking history. He has never used smokeless tobacco. He reports current alcohol use. He reports current drug use. Drug: Marijuana.   Immunization History  Administered Date(s) Administered  . Td 05/19/2009  . Tdap 12/05/2019   Health Maintenance  Topic Date Due  . COVID-19 Vaccine (1) Never done  . Hepatitis C Screening  [birth year]04[birth year]2020 (Originally [birth year]  . HIV Screening  12/04/2020 (Originally 07/11/1980)  . COLONOSCOPY (Pts 45-13yr Insurance coverage will need to be confirmed)  06/08/2022  . TETANUS/TDAP  12/04/2029  . INFLUENZA VACCINE  Discontinued    Declines the influenza vaccine and COVID vaccines  Colonoscopy 05/2017 Dr. PHenrene Pastordue 5 years EGD: 04/2017 normal Ct AB pelvis 03/17/2014  CXR 03/2018 MRI AB 08/2014 Echo 12/2014  Dr. Sabra Heck eye, 2-3 months, 10/2019 yearly  Review of Systems:  Review of Systems  Constitutional: Negative.   HENT: Negative.   Eyes: Negative.   Respiratory: Negative for cough, hemoptysis, sputum production, shortness of breath and wheezing.   Cardiovascular: Negative.   Gastrointestinal: Negative.   Genitourinary: Negative.   Musculoskeletal: Negative.   Skin: Negative for rash.  Neurological: Negative.   Endo/Heme/Allergies: Negative.   Psychiatric/Behavioral: Negative.    Physical Exam: BP 132/80   Pulse 67   Temp 97.7 F (36.5 C)   Ht 5' 9"  (1.753 m)   Wt 225 lb (102.1 kg)   SpO2 96%   BMI 33.23 kg/m  Wt Readings from Last 3 Encounters:  06/18/20 225 lb (102.1 kg)  12/05/19 221 lb (100.2 kg)  08/29/19 217 lb 6.4 oz (98.6 kg)   General Appearance: Well nourished, in no apparent distress. Eyes: PERRLA, EOMs, conjunctiva no swelling or erythema Sinuses: No Frontal/maxillary tenderness ENT/Mouth: Ext aud canals clear, TMs without erythema,  bulging. No erythema, swelling, or exudate on post pharynx.  Tonsils not swollen or erythematous. Hearing normal.  Neck: Supple, thyroid normal.  Respiratory: Respiratory effort normal, BS equal bilaterally without rales, rhonchi, wheezing or stridor.  Cardio: RRR with no MRGs. Brisk peripheral pulses without edema.  Abdomen: Soft, + BS, obese,  Non tender, no guarding, rebound, hernias, masses. Lymphatics: Non tender without lymphadenopathy.  Musculoskeletal: Full ROM, 5/5 strength, Normal Skin: Several healed scars along AB. Warm, dry without rashes, lesions, ecchymosis.  Neuro: Cranial nerves intact. Normal muscle tone except some minor muscle wasting lateral right hand with decrease extension of 3 lateral fingers, normal distal vascular, no cerebellar symptoms. Psych: Awake and oriented X 3, normal affect, Insight and Judgment appropriate.     Garnet Sierras, Laqueta Jean, DNP [facility] 06/18/2020  9:10 AM

## 2020-06-19 LAB — CBC WITH DIFFERENTIAL/PLATELET
Absolute Monocytes: 540 cells/uL (ref 200–950)
Basophils Absolute: 43 cells/uL (ref 0–200)
Basophils Relative: 0.6 %
Eosinophils Absolute: 178 cells/uL (ref 15–500)
Eosinophils Relative: 2.5 %
HCT: 43.2 % (ref 38.5–50.0)
Hemoglobin: 14.9 g/dL (ref 13.2–17.1)
Lymphs Abs: 1072 cells/uL (ref 850–3900)
MCH: 31.8 pg (ref 27.0–33.0)
MCHC: 34.5 g/dL (ref 32.0–36.0)
MCV: 92.1 fL (ref 80.0–100.0)
MPV: 10.4 fL (ref 7.5–12.5)
Monocytes Relative: 7.6 %
Neutro Abs: 5268 cells/uL (ref 1500–7800)
Neutrophils Relative %: 74.2 %
Platelets: 282 10*3/uL (ref 140–400)
RBC: 4.69 10*6/uL (ref 4.20–5.80)
RDW: 13.3 % (ref 11.0–15.0)
Total Lymphocyte: 15.1 %
WBC: 7.1 10*3/uL (ref 3.8–10.8)

## 2020-06-19 LAB — LIPID PANEL
Cholesterol: 108 mg/dL (ref <200)
HDL: 36 mg/dL — ABNORMAL LOW (ref >=40)
LDL Cholesterol (Calc): 56 mg/dL (calc)
Non-HDL Cholesterol (Calc): 72 mg/dL (calc) (ref <130)
Total CHOL/HDL Ratio: 3 (calc) (ref <5.0)
Triglycerides: 82 mg/dL (ref <150)

## 2020-06-19 LAB — COMPLETE METABOLIC PANEL WITH GFR
AG Ratio: 1.4 (calc) (ref 1.0–2.5)
ALT: 28 U/L (ref 9–46)
AST: 20 U/L (ref 10–35)
Albumin: 4.1 g/dL (ref 3.6–5.1)
Alkaline phosphatase (APISO): 118 U/L (ref 35–144)
BUN: 16 mg/dL (ref 7–25)
CO2: 25 mmol/L (ref 20–32)
Calcium: 9.2 mg/dL (ref 8.6–10.3)
Chloride: 105 mmol/L (ref 98–110)
Creat: 0.81 mg/dL (ref 0.70–1.33)
GFR, Est African American: 116 mL/min/{1.73_m2} (ref >=60)
GFR, Est Non African American: 100 mL/min/{1.73_m2} (ref >=60)
Globulin: 2.9 g/dL (calc) (ref 1.9–3.7)
Glucose, Bld: 103 mg/dL — ABNORMAL HIGH (ref 65–99)
Potassium: 4.3 mmol/L (ref 3.5–5.3)
Sodium: 138 mmol/L (ref 135–146)
Total Bilirubin: 0.6 mg/dL (ref 0.2–1.2)
Total Protein: 7 g/dL (ref 6.1–8.1)

## 2020-08-13 ENCOUNTER — Ambulatory Visit
Admission: RE | Admit: 2020-08-13 | Discharge: 2020-08-13 | Disposition: A | Discharge status: Home / Self Care | Payer: Federal, State, Local not specified - PPO | Source: Ambulatory Visit

## 2020-08-13 ENCOUNTER — Other Ambulatory Visit: Payer: Self-pay

## 2020-08-13 VITALS — BP 143/86 | HR 68 | Temp 97.3°F | Resp 16

## 2020-08-13 DIAGNOSIS — J0110 Acute frontal sinusitis, unspecified: Secondary | ICD-10-CM

## 2020-08-13 MED ORDER — AMOXICILLIN-POT CLAVULANATE 875-125 MG PO TABS: 1.0000 | ORAL_TABLET | Freq: Two times a day (BID) | ORAL | 0 refills | Status: DC

## 2020-08-13 NOTE — Discharge Instructions (Signed)
Treating you for a sinus infection Take the antibiotics as prescribed OTC medicines as needed.  Follow up as needed for continued or worsening symptoms

## 2020-08-13 NOTE — ED Provider Notes (Signed)
Cole [name]    CSN: [id number] Arrival date & time: 08/13/20  0848      History   Chief Complaint Chief Complaint  Patient presents with  . Cough  . Nasal Congestion    HPI Cole [name] is a 55 y.o. male.   55 year old male presents today with complaints of approximately 2 with nasal congestion, sinus pressure, cough.  Symptoms have been constant.  He has been taking allergy medication and Mucinex with no relief.  No fever, chills, chest pain or shortness of breath.   Cough   Past Medical History:  Diagnosis Date  . ADD (attention deficit disorder)   . Allergy   . Appendicitis   . Arthritis   . ASHD (arteriosclerotic heart disease)   . Cancer ([place])   . Colitis   . Crohn's disease ([place])   . Eczema   . GERD (gastroesophageal reflux disease)   . Hyperlipidemia   . Hypertension   . Left renal mass   . Morbid obesity ([place])   . Myocardial infarction [facility])    status post RCA stenting in [facility] 2011  . Positive for macroalbuminuria   . Renal cell carcinoma, left ([place]) 08/22/2019   Dr. Risa Grill for left renal mass found during crohn's evaluation, had left partial nephrectomy 01/17/2015 and had + renal cell carcinoma     Patient Active Problem List   Diagnosis Date Noted  . Emphysema of lung ([place]) 12/28/2019  . Adrenal adenoma, left 12/28/2019  . History of nephrolithiasis 12/05/2019  . History of renal cell cancer 08/22/2019  . Chronic iron deficiency anemia 04/06/2018  . Morbid obesity ([place]) 04/06/2018  . Atherosclerosis of aorta ([place]) 05/04/2017  . Smoker 12/04/2014  . Vitamin D deficiency 12/04/2014  . Medication management 12/04/2014  . Crohn's disease of ileum ([place]) 06/12/2014  . Hyperlipidemia   . Hypertension   . ADD (attention deficit disorder)   . ASHD (arteriosclerotic heart disease)     Past Surgical History:  Procedure Laterality Date  . APPENDECTOMY    . COLON SURGERY    . CORONARY STENT PLACEMENT     x 1  . ELBOW SURGERY    .  ROBOTIC ASSITED PARTIAL NEPHRECTOMY Left 01/17/2015   Procedure: ROBOTIC ASSITED LAPAROSCOPIC LEFT  PARTIAL  NEPHRECTOMY;  Surgeon: Ardis Hughs, MD;  Location: [facility];  Service: Urology;  Laterality: Left;       Home Medications    Prior to Admission medications   Medication Sig Start Date End Date Taking? Authorizing Provider  amoxicillin-clavulanate (AUGMENTIN) 875-125 MG tablet Take 1 tablet by mouth every 12 (twelve) hours. 08/13/20  Yes Aliyah Abeyta A, NP  albuterol (PROAIR HFA) 108 (90 Base) MCG/ACT inhaler Inhale 2 puffs into the lungs every 6 (six) hours as needed for wheezing or shortness of breath. 06/18/20   Garnet Sierras, NP  amphetamine-dextroamphetamine (ADDERALL) 20 MG tablet Take 1 tablet (20 mg total) by mouth 2 (two) times daily. 06/18/20 06/18/21  Garnet Sierras, NP  aspirin 81 MG chewable tablet Chew by mouth.    [provider]  atorvastatin (LIPITOR) 80 MG tablet Take     1 tablet      Daily       for Cholesterol 04/17/20   Unk Pinto, MD  cetirizine (ZYRTEC) 10 MG tablet Take 10 mg by mouth daily as needed for allergies.     [provider]  esomeprazole (NEXIUM) 40 MG capsule Take 1 capsule (40 mg total) by mouth 2 (two)  times daily before a meal. 10/19/17   Irene Shipper, MD  ferrous sulfate 325 (65 FE) MG tablet Take 1 tablet (325 mg total) by mouth 2 (two) times daily. Patient needs office visit for further refills 12/06/18   Irene Shipper, MD  fluticasone [facility]) 50 MCG/ACT nasal spray Place 2 sprays into both nostrils at bedtime. 08/29/19   Liane Comber, NP  fluticasone furoate-vilanterol (BREO ELLIPTA) 100-25 MCG/INH AEPB Inhale 1 puff into the lungs daily. Rinse mouth with water after each use 04/04/19   Vicie Mutters R, PA-C  lidocaine (LIDODERM) 5 % Place 1 patch onto the skin daily. Remove & Discard patch within 12 hours or as directed by MD 07/02/17   Vladimir Crofts, PA-C  metoprolol tartrate (LOPRESSOR) 25 MG tablet Take 1  tablet (25 mg total) by mouth 2 (two) times daily. 06/18/20   Garnet Sierras, NP    Family History Family History  Problem Relation Age of Onset  . Diabetes Maternal Grandmother   . Crohn's disease Paternal Aunt   . Crohn's disease Paternal Uncle   . Colon cancer Neg Hx   . Esophageal cancer Neg Hx   . Rectal cancer Neg Hx   . Stomach cancer Neg Hx   . Pancreatic cancer Neg Hx   . Liver cancer Neg Hx     Social History Social History   Tobacco Use  . Smoking status: Current Every Day Smoker    Packs/day: 1.00    Years: 30.00    Pack years: 30.00  . Smokeless tobacco: Never Used  Vaping Use  . Vaping Use: Never used  Substance Use Topics  . Alcohol use: Yes    Comment: Occasional Beer  . Drug use: Yes    Types: Marijuana    Comment: patient smoked one joint 2 days ago, h/o cocaine use 20 years ago.     Allergies   Patient has no known allergies.   Review of Systems Review of Systems  Respiratory: Positive for cough.      Physical Exam Triage Vital Signs ED Triage Vitals  Enc Vitals Group     BP 08/13/20 0904 (!) 143/86     Pulse Rate 08/13/20 0904 68     Resp 08/13/20 0904 16     Temp 08/13/20 0904 (!) 97.3 F (36.3 C)     Temp Source 08/13/20 0904 Temporal     SpO2 08/13/20 0904 95 %     Weight --      Height --      Head Circumference --      Peak Flow --      Pain Score 08/13/20 0911 0     Pain Loc --      Pain Edu? --      Excl. in [place]? --    No data found.  Updated Vital Signs BP (!) 143/86 (BP Location: Left Arm)   Pulse 68   Temp (!) 97.3 F (36.3 C) (Temporal)   Resp 16   SpO2 95%   Visual Acuity Right Eye Distance:   Left Eye Distance:   Bilateral Distance:    Right Eye Near:   Left Eye Near:    Bilateral Near:     Physical Exam Vitals and nursing note reviewed.  Constitutional:      General: He is not in acute distress.    Appearance: Normal appearance. He is not ill-appearing, toxic-appearing or diaphoretic.  HENT:      Head: Normocephalic and atraumatic.  Right Ear: Tympanic membrane and ear canal normal.     Left Ear: Tympanic membrane and ear canal normal.     Nose: Congestion present.     Mouth/Throat:     Comments: PND  Eyes:     Conjunctiva/sclera: Conjunctivae normal.  Cardiovascular:     Rate and Rhythm: Normal rate and regular rhythm.  Pulmonary:     Effort: Pulmonary effort is normal.     Breath sounds: Normal breath sounds.  Musculoskeletal:        General: Normal range of motion.     Cervical back: Normal range of motion.  Skin:    General: Skin is warm and dry.  Neurological:     Mental Status: He is alert.  Psychiatric:        Mood and Affect: Mood normal.      UC Treatments / Results  Labs (all labs ordered are listed, but only abnormal results are displayed) Labs Reviewed - No data to display  EKG   Radiology No results found.  Procedures Procedures (including critical care time)  Medications Ordered in UC Medications - No data to display  Initial Impression / Assessment and Plan / UC Course  I have reviewed the triage vital signs and the nursing notes.  Pertinent labs & imaging results that were available during my care of the patient were reviewed by me and considered in my medical decision making (see chart for details).     Sinusitis Treating with antibiotics based on length of symptoms.  Recommend over-the-counter medicines as needed. Follow up as needed for continued or worsening symptoms  Final Clinical Impressions(s) / UC Diagnoses   Final diagnoses:  Acute non-recurrent frontal sinusitis     Discharge Instructions     Treating you for a sinus infection Take the antibiotics as prescribed OTC medicines as needed.  Follow up as needed for continued or worsening symptoms     ED Prescriptions    Medication Sig Dispense Auth. Provider   amoxicillin-clavulanate (AUGMENTIN) 875-125 MG tablet Take 1 tablet by mouth every 12 (twelve)  hours. 14 tablet Sunita Demond A, NP     PDMP not reviewed this encounter.   Loura Halt A, NP 08/13/20 [phone]

## 2020-08-13 NOTE — ED Triage Notes (Signed)
Patient presents to Urgent Care with complaints of cough and head congestion x 2 weeks. He states he has a hx of sinus infections. Treating symptoms with allergy medications and mucinex with no relief.   Denies fever, n/v, diarrhea.

## 2020-09-17 ENCOUNTER — Ambulatory Visit: Payer: Federal, State, Local not specified - PPO | Admitting: Adult Health Nurse Practitioner

## 2020-10-15 ENCOUNTER — Other Ambulatory Visit: Payer: Self-pay | Admitting: Internal Medicine

## 2020-10-15 DIAGNOSIS — E785 Hyperlipidemia, unspecified: Secondary | ICD-10-CM

## 2020-10-15 DIAGNOSIS — I2510 Atherosclerotic heart disease of native coronary artery without angina pectoris: Secondary | ICD-10-CM

## 2020-10-15 MED ORDER — ATORVASTATIN CALCIUM 80 MG PO TABS
ORAL_TABLET | ORAL | 0 refills | Status: DC
Start: 2020-10-15 — End: 2024-03-28

## 2020-12-05 ENCOUNTER — Encounter: Payer: Federal, State, Local not specified - PPO | Admitting: Adult Health Nurse Practitioner

## 2020-12-18 ENCOUNTER — Encounter: Payer: Federal, State, Local not specified - PPO | Admitting: Adult Health Nurse Practitioner

## 2020-12-19 ENCOUNTER — Encounter: Payer: Federal, State, Local not specified - PPO | Admitting: Adult Health

## 2021-01-04 NOTE — Progress Notes (Signed)
Complete physical  Assessment and Plan:  Encounter for general adult medical examination with abnormal findings Due Annually  Essential hypertension - continue medications, DASH diet, exercise and monitor at home. Call if greater than 130/80.  - CBC with Differential/Platelet - CMP - TSH   ASHD (arteriosclerotic heart disease) Control blood pressure, cholesterol, glucose, increase exercise.  Stop smoking   Hyperlipidemia -continue medications, check lipids, decrease fatty foods, increase activity.  - Lipid panel   Crohn's disease of ileum, unspecified complication ([place]) Continue GI follow up, diet modifications   Vitamin D deficiency - VITAMIN D 25 Hydroxy (Vit-D Deficiency, Fractures)   Medication management - Magnesium   Left renal mass A/p nephrectomy, follow up urology   ADD (attention deficit disorder) - amphetamine-dextroamphetamine (ADDERALL) 20 MG tablet; Take 1 tablet (20 mg total) by mouth 2 (two) times daily.  Dispense: 60 tablet; Refill: 0  Smoker Advised to quit smoking, not willing at this time. Still smoking a pack a day  Screening PSA (prostate specific antigen) -     PSA  Atherosclerosis of aorta (HCC) Control blood pressure, cholesterol, glucose, increase exercise.  -     EKG 12-Lead  Medication management -     CBC with Differential/Platelet -     COMPLETE METABOLIC PANEL WITH GFR -     Lipid panel -     TSH -     Hemoglobin A1c -     VITAMIN D 25 Hydroxy (Vit-D Deficiency, Fractures) -     Magnesium -     EKG 12-Lead -     PSA -     Microalbumin / creatinine urine ratio -     Urinalysis, Routine w reflex microscopic  Chronic iron deficiency anemia -     CBC with Differential/Platelet  Screening for thyroid disorder -     TSH  Screening for cardiovascular condition -     EKG 12-Lead  Screening for blood or protein in urine -     Microalbumin / creatinine urine ratio -     Urinalysis, Routine w reflex microscopic   Over 30  minutes of exam, counseling, chart review, and critical decision making was performed.  Continue diet and meds as discussed. Further disposition pending results of labs. Future Appointments  Date Time Provider [place]  07/08/2021  9:30 AM Magda Bernheim, NP [facility] None  01/08/2022  9:00 AM Magda Bernheim, NP [facility] None    HPI 55 y.o. smoking whit male  presents for CPE and  3 month follow up with hypertension, hyperlipidemia, prediabetes, CAD s/p stent  and vitamin D.   His blood pressure has been controlled at home, today their BP is BP: 120/70 BP Readings from Last 3 Encounters:  01/07/21 120/70  08/13/20 (!) 143/86  06/18/20 132/80     He does not workout but is physically active.  He denies chest pain, shortness of breath, dizziness.  He hasnt had to use albuterol 1-2 x a month.   BMI is Body mass index is 31.78 kg/m., he is working on diet and exercise. Wt Readings from Last 3 Encounters:  01/07/21 215 lb 3.2 oz (97.6 kg)  06/18/20 225 lb (102.1 kg)  12/05/19 221 lb (100.2 kg)   Crohn's follows with Dr. [name]. Has not seen Dr. [name] lately, has been controlling with diet  He also follows with Dr. Risa Grill for left renal mass found during crohn's evaluation, had left partial nephrectomy 01/17/2015 and had + renal cell carcinoma following with urology.  Has not seen doctor recently  He continues to smoke but not interested in quitting. He had low dose chest CT for screening last year which showed: Lung-RADS 2, benign appearance or behavior. Continue annual screening with low-dose chest CT without contrast in 12 months. He does not want to pursue repeat this year but will schedule next year.   Granddaughter is 3 had previous heart surgery and doing well.   He has a history of ASHD S/P Stenting in 2010.  He is on ASA.  Denies dyspnea, exertional chest pressure/discomfort and irregular heart beat.   He is on cholesterol medication, lipitor 5m and denies myalgias. His  cholesterol is at goal. The cholesterol last visit was:   Lab Results  Component Value Date   CHOL 108 06/18/2020   HDL 36 (L) 06/18/2020   LDLCALC 56 06/18/2020   TRIG 82 06/18/2020   CHOLHDL 3.0 06/18/2020    He has been working on diet and exercise for prediabetes, and denies paresthesia of the feet, polydipsia, polyuria and visual disturbances. Last A1C in the office was:  Lab Results  Component Value Date   HGBA1C 5.3 12/05/2019   Patient is on Vitamin D supplement.  Last vitamin D Lab Results  Component Value Date   VD25OH 62 12/05/2019    BMI is Body mass index is 31.78 kg/m., he is working on diet and exercise. Wt Readings from Last 3 Encounters:  01/07/21 215 lb 3.2 oz (97.6 kg)  06/18/20 225 lb (102.1 kg)  12/05/19 221 lb (100.2 kg)    Current Medications:     Current Outpatient Medications (Cardiovascular):    atorvastatin (LIPITOR) 80 MG tablet, Take  1 tablet  Daily  for Cholesterol   metoprolol tartrate (LOPRESSOR) 25 MG tablet, Take 1 tablet (25 mg total) by mouth 2 (two) times daily.   Current Outpatient Medications (Analgesics):    aspirin 81 MG chewable tablet, Chew by mouth.  Current Outpatient Medications (Hematological):    ferrous sulfate 325 (65 FE) MG tablet, Take 1 tablet (325 mg total) by mouth 2 (two) times daily. Patient needs office visit for further refills  Current Outpatient Medications (Other):    Multiple Vitamins-Minerals (MULTIVITAMIN WITH MINERALS) tablet, Take 1 tablet by mouth daily.   amphetamine-dextroamphetamine (ADDERALL) 20 MG tablet, Take 1 tablet (20 mg total) by mouth 2 (two) times daily.  Medical History:  Past Medical History:  Diagnosis Date   ADD (attention deficit disorder)    Allergy    Appendicitis    Arthritis    ASHD (arteriosclerotic heart disease)    Cancer (HCC)    Colitis    [place]    Eczema    GERD (gastroesophageal reflux disease)    Hyperlipidemia    Hypertension    Left renal  mass    [place]bid [place]    [facility]    status post RCA stenting in [place] 2011   Positive for macroalbuminuria    Ren[place] cell carcinoma, [place] 08/[place]   Dr. GRisa Grillfor left renal mass found during crohn's evaluation, had left partial nephrectomy 01/17/2015 and had + renal cell carcinoma    Allergies No Known Allergies  SURGICAL HISTORY He  has a past surgical history that includes Appendectomy; Coronary stent placement; Colon surgery; Robotic assited partial nephrectomy (Left, 01/17/2015); and Elbow surgery. FAMILY HISTORY His family history includes Crohn's disease in his paternal aunt and paternal uncle; Diabetes in his maternal grandmother. SOCIAL HISTORY He  reports that  he has been smoking. He has a 30.00 pack-year smoking history. He has never used smokeless tobacco. He reports current alcohol use. He reports current drug use. Drug: Marijuana.   Immunization History  Administered Date(s) Administered   Td 05/19/2009   Tdap 12/05/2019   Health Maintenance  Topic Date Due   COVID-19 Vaccine (1) Never done   Pneumococcal Vaccine 79-80 Years old (1 - PCV) Never done   HIV Screening  Never done   Hepatitis C Screening  Never done   Zoster Vaccines- Shingrix (1 of 2) Never done   COLONOSCOPY (Pts 45-33yr Insurance coverage will need to be confirmed)  06/08/2022   TETANUS/TDAP  12/04/2029   HPV VACCINES  Aged Out   INFLUENZA VACCINE  Discontinued    Declines the influenza vaccine and COVID vaccines  Colonoscopy 05/2017 Dr. PHenrene Pastordue 2024 EGD: 04/2017 normal Ct AB pelvis 03/17/2014 CXR 03/2018 MRI AB 08/2014 Echo 08/20161.  Low dose chest CT 12/26/19:1. Lung-RADS 2, benign appearance or behavior. Continue annual screening with low-dose chest CT without contrast in 12 months. Wants to hold this year repeat next year.  Dr. MSabra Heckeye, 2-3 months, 05/2020  Review of Systems:  Review of Systems  Constitutional: Negative.  Negative for chills  and fever.  HENT: Negative.  Negative for congestion, hearing loss, sinus pain, sore throat and tinnitus.   Eyes: Negative.  Negative for blurred vision and double vision.  Respiratory:  Positive for cough (productive clear mucus in am). Negative for hemoptysis, sputum production, shortness of breath and wheezing.   Cardiovascular: Negative.  Negative for chest pain, palpitations and leg swelling.  Gastrointestinal: Negative.  Negative for abdominal pain, constipation, diarrhea, heartburn, nausea and vomiting.  Genitourinary: Negative.  Negative for dysuria and urgency.  Musculoskeletal:  Positive for joint pain (knuckle pain). Negative for back pain, falls, myalgias and neck pain.  Skin:  Negative for rash.  Neurological: Negative.  Negative for dizziness, tingling, tremors, weakness and headaches.  Endo/Heme/Allergies: Negative.  Does not bruise/bleed easily.  Psychiatric/Behavioral: Negative.  Negative for depression and suicidal ideas. The patient is not nervous/anxious and does not have insomnia.   Physical Exam: BP 120/70   Pulse (!) 58   Temp 97.9 F (36.6 C)   Wt 215 lb 3.2 oz (97.6 kg)   SpO2 96%   BMI 31.78 kg/m  Wt Readings from Last 3 Encounters:  01/07/21 215 lb 3.2 oz (97.6 kg)  06/18/20 225 lb (102.1 kg)  12/05/19 221 lb (100.2 kg)   General Appearance: Well nourished, in no apparent distress. Eyes: PERRLA, EOMs, conjunctiva no swelling or erythema Sinuses: No Frontal/maxillary tenderness ENT/Mouth: Ext aud canals clear, TMs without erythema, bulging. No erythema, swelling, or exudate on post pharynx.  Tonsils not swollen or erythematous. Hearing normal.  Neck: Supple, thyroid normal.  Respiratory: Respiratory effort normal, BS equal bilaterally without rales, rhonchi, wheezing or stridor.  Cardio: RRR with no MRGs. Brisk peripheral pulses without edema.  Abdomen: Soft, + BS, obese,  Non tender, no guarding, rebound, hernias, masses. Lymphatics: Non tender without  lymphadenopathy.  Musculoskeletal: Full ROM, 5/5 strength, Normal Skin: Several healed scars along AB. Warm, dry without rashes, lesions, ecchymosis.  Neuro: Cranial nerves intact. Normal muscle tone except some minor muscle wasting lateral right hand with decrease extension of 3 lateral fingers, normal distal vascular, no cerebellar symptoms. Psych: Awake and oriented X 3, normal affect, Insight and Judgment appropriate.   EKG Sinus bradycardia rate 59., no ST changes, 1st degree Av block- unchanged  from last year   Magda Bernheim, NP 11:27 AM [facility]

## 2021-01-07 ENCOUNTER — Encounter: Payer: Self-pay | Admitting: Nurse Practitioner

## 2021-01-07 ENCOUNTER — Other Ambulatory Visit: Payer: Self-pay

## 2021-01-07 ENCOUNTER — Ambulatory Visit: Payer: Federal, State, Local not specified - PPO | Admitting: Nurse Practitioner

## 2021-01-07 VITALS — BP 120/70 | HR 58 | Temp 97.9°F | Wt 215.2 lb

## 2021-01-07 DIAGNOSIS — Z0000 Encounter for general adult medical examination without abnormal findings: Secondary | ICD-10-CM

## 2021-01-07 DIAGNOSIS — F909 Attention-deficit hyperactivity disorder, unspecified type: Secondary | ICD-10-CM

## 2021-01-07 DIAGNOSIS — Z1389 Encounter for screening for other disorder: Secondary | ICD-10-CM

## 2021-01-07 DIAGNOSIS — I10 Essential (primary) hypertension: Secondary | ICD-10-CM | POA: Diagnosis not present

## 2021-01-07 DIAGNOSIS — E559 Vitamin D deficiency, unspecified: Secondary | ICD-10-CM

## 2021-01-07 DIAGNOSIS — Z131 Encounter for screening for diabetes mellitus: Secondary | ICD-10-CM

## 2021-01-07 DIAGNOSIS — K50019 Crohn's disease of small intestine with unspecified complications: Secondary | ICD-10-CM

## 2021-01-07 DIAGNOSIS — E785 Hyperlipidemia, unspecified: Secondary | ICD-10-CM

## 2021-01-07 DIAGNOSIS — N401 Enlarged prostate with lower urinary tract symptoms: Secondary | ICD-10-CM | POA: Diagnosis not present

## 2021-01-07 DIAGNOSIS — Z125 Encounter for screening for malignant neoplasm of prostate: Secondary | ICD-10-CM

## 2021-01-07 DIAGNOSIS — D509 Iron deficiency anemia, unspecified: Secondary | ICD-10-CM

## 2021-01-07 DIAGNOSIS — Z0001 Encounter for general adult medical examination with abnormal findings: Secondary | ICD-10-CM

## 2021-01-07 DIAGNOSIS — Z85528 Personal history of other malignant neoplasm of kidney: Secondary | ICD-10-CM

## 2021-01-07 DIAGNOSIS — R350 Frequency of micturition: Secondary | ICD-10-CM

## 2021-01-07 DIAGNOSIS — Z136 Encounter for screening for cardiovascular disorders: Secondary | ICD-10-CM

## 2021-01-07 DIAGNOSIS — Z1329 Encounter for screening for other suspected endocrine disorder: Secondary | ICD-10-CM | POA: Diagnosis not present

## 2021-01-07 DIAGNOSIS — Z79899 Other long term (current) drug therapy: Secondary | ICD-10-CM

## 2021-01-07 DIAGNOSIS — F17200 Nicotine dependence, unspecified, uncomplicated: Secondary | ICD-10-CM

## 2021-01-07 DIAGNOSIS — I700 Atherosclerosis of aorta: Secondary | ICD-10-CM

## 2021-01-07 DIAGNOSIS — Z13220 Encounter for screening for lipoid disorders: Secondary | ICD-10-CM | POA: Diagnosis not present

## 2021-01-07 MED ORDER — AMPHETAMINE-DEXTROAMPHETAMINE 20 MG PO TABS: 20.0000 mg | ORAL_TABLET | Freq: Two times a day (BID) | ORAL | 0 refills | Status: DC

## 2021-01-07 NOTE — Patient Instructions (Signed)

## 2021-01-08 LAB — LIPID PANEL
Cholesterol: 147 mg/dL (ref <200)
HDL: 36 mg/dL — ABNORMAL LOW (ref >=40)
LDL Cholesterol (Calc): 80 mg/dL (calc)
Non-HDL Cholesterol (Calc): 111 mg/dL (calc) (ref <130)
Total CHOL/HDL Ratio: 4.1 (calc) (ref <5.0)
Triglycerides: 215 mg/dL — ABNORMAL HIGH (ref <150)

## 2021-01-08 LAB — COMPLETE METABOLIC PANEL WITH GFR
AG Ratio: 1.3 (calc) (ref 1.0–2.5)
ALT: 30 U/L (ref 9–46)
AST: 19 U/L (ref 10–35)
Albumin: 3.8 g/dL (ref 3.6–5.1)
Alkaline phosphatase (APISO): 100 U/L (ref 35–144)
BUN: 15 mg/dL (ref 7–25)
CO2: 27 mmol/L (ref 20–32)
Calcium: 9.2 mg/dL (ref 8.6–10.3)
Chloride: 105 mmol/L (ref 98–110)
Creat: 1.05 mg/dL (ref 0.70–1.30)
Globulin: 2.9 g/dL (calc) (ref 1.9–3.7)
Glucose, Bld: 86 mg/dL (ref 65–99)
Potassium: 4.3 mmol/L (ref 3.5–5.3)
Sodium: 138 mmol/L (ref 135–146)
Total Bilirubin: 0.5 mg/dL (ref 0.2–1.2)
Total Protein: 6.7 g/dL (ref 6.1–8.1)
eGFR: 83 mL/min/{1.73_m2} (ref >=60)

## 2021-01-08 LAB — CBC WITH DIFFERENTIAL/PLATELET
Absolute Monocytes: 747 cells/uL (ref 200–950)
Basophils Absolute: 29 cells/uL (ref 0–200)
Basophils Relative: 0.3 %
Eosinophils Absolute: 126 cells/uL (ref 15–500)
Eosinophils Relative: 1.3 %
HCT: 41.6 % (ref 38.5–50.0)
Hemoglobin: 14 g/dL (ref 13.2–17.1)
Lymphs Abs: 1494 cells/uL (ref 850–3900)
MCH: 30.8 pg (ref 27.0–33.0)
MCHC: 33.7 g/dL (ref 32.0–36.0)
MCV: 91.4 fL (ref 80.0–100.0)
MPV: 10.3 fL (ref 7.5–12.5)
Monocytes Relative: 7.7 %
Neutro Abs: 7304 cells/uL (ref 1500–7800)
Neutrophils Relative %: 75.3 %
Platelets: 335 10*3/uL (ref 140–400)
RBC: 4.55 10*6/uL (ref 4.20–5.80)
RDW: 13.3 % (ref 11.0–15.0)
Total Lymphocyte: 15.4 %
WBC: 9.7 10*3/uL (ref 3.8–10.8)

## 2021-01-08 LAB — URINALYSIS, ROUTINE W REFLEX MICROSCOPIC
Bacteria, UA: NONE SEEN /HPF
Bilirubin Urine: NEGATIVE
Glucose, UA: NEGATIVE
Hgb urine dipstick: NEGATIVE
Hyaline Cast: NONE SEEN /LPF
Ketones, ur: NEGATIVE
Leukocytes,Ua: NEGATIVE
Nitrite: NEGATIVE
RBC / HPF: NONE SEEN /HPF (ref 0–2)
Specific Gravity, Urine: 1.019 (ref 1.001–1.035)
Squamous Epithelial / HPF: NONE SEEN /HPF (ref <=5)
WBC, UA: NONE SEEN /HPF (ref 0–5)
pH: 5.5 (ref 5.0–8.0)

## 2021-01-08 LAB — MAGNESIUM: Magnesium: 2 mg/dL (ref 1.5–2.5)

## 2021-01-08 LAB — HEMOGLOBIN A1C
Hgb A1c MFr Bld: 5.4 % of total Hgb (ref <5.7)
Mean Plasma Glucose: 108 mg/dL
eAG (mmol/L): 6 mmol/L

## 2021-01-08 LAB — MICROALBUMIN / CREATININE URINE RATIO
Creatinine, Urine: 145 mg/dL (ref 20–320)
Microalb Creat Ratio: 50 mcg/mg creat — ABNORMAL HIGH (ref <30)
Microalb, Ur: 7.3 mg/dL

## 2021-01-08 LAB — MICROSCOPIC MESSAGE

## 2021-01-08 LAB — PSA: PSA: 0.45 ng/mL (ref <=4.00)

## 2021-01-08 LAB — TSH: TSH: 1.27 mIU/L (ref 0.40–4.50)

## 2021-01-08 LAB — VITAMIN D 25 HYDROXY (VIT D DEFICIENCY, FRACTURES): Vit D, 25-Hydroxy: 62 ng/mL (ref 30–100)

## 2021-04-12 ENCOUNTER — Encounter (HOSPITAL_COMMUNITY): Payer: Self-pay

## 2021-04-12 ENCOUNTER — Other Ambulatory Visit: Payer: Self-pay

## 2021-04-12 ENCOUNTER — Ambulatory Visit (HOSPITAL_COMMUNITY)
Admission: RE | Admit: 2021-04-12 | Discharge: 2021-04-12 | Disposition: A | Discharge status: Home / Self Care | Payer: Federal, State, Local not specified - PPO | Source: Ambulatory Visit | Attending: Emergency Medicine | Admitting: Emergency Medicine

## 2021-04-12 VITALS — BP 135/89 | HR 67 | Temp 98.9°F | Resp 17

## 2021-04-12 DIAGNOSIS — J0140 Acute pansinusitis, unspecified: Secondary | ICD-10-CM

## 2021-04-12 DIAGNOSIS — J439 Emphysema, unspecified: Secondary | ICD-10-CM

## 2021-04-12 LAB — POC INFLUENZA A AND B ANTIGEN (URGENT CARE ONLY)
INFLUENZA A ANTIGEN, POC: NEGATIVE
INFLUENZA B ANTIGEN, POC: NEGATIVE

## 2021-04-12 MED ORDER — AMOXICILLIN-POT CLAVULANATE 875-125 MG PO TABS: 1.0000 | ORAL_TABLET | Freq: Two times a day (BID) | ORAL | 0 refills | Status: DC

## 2021-04-12 MED ORDER — AEROCHAMBER PLUS MISC: 2 refills | Status: DC

## 2021-04-12 MED ORDER — PREDNISONE 20 MG PO TABS: 40.0000 mg | ORAL_TABLET | Freq: Every day | ORAL | 0 refills | Status: AC

## 2021-04-12 MED ORDER — FLUTICASONE PROPIONATE 50 MCG/ACT NA SUSP: 2.0000 | Freq: Every day | NASAL | 0 refills | Status: DC

## 2021-04-12 MED ORDER — ALBUTEROL SULFATE HFA 108 (90 BASE) MCG/ACT IN AERS: 1.0000 | INHALATION_SPRAY | RESPIRATORY_TRACT | 0 refills | Status: DC | PRN

## 2021-04-12 NOTE — ED Triage Notes (Signed)
Pt report for a week had congestion and cough.

## 2021-04-12 NOTE — ED Provider Notes (Signed)
HPI  SUBJECTIVE:  Cole [name] is a 55 y.o. male who presents with 1 week of nasal congestion, green rhinorrhea, headaches, sinus pain and pressure, cough productive of the same material as his nasal congestion, wheezing, shortness of breath.  No fevers.  He reports body aches at first, but these have resolved.  No upper dental pain, facial swelling, sore throat, loss of sense of taste or smell, nausea, vomiting, diarrhea, abdominal pain.  No known COVID or flu exposure.  He did not get the COVID or flu vaccines.  He denies double sickening.  No antibiotics in the past month.  He took Aleve this morning.  States he is unable to sleep at night secondary to the cough.  He states that he ran out of his albuterol inhaler.  He has been taking Aleve with improvement in his symptoms.  No aggravating factors.  He has a past medical history of hypercholesterolemia, hypertension, renal cell carcinoma, emphysema, Crohn's.  He denies having chronic kidney disease.  DTO:IZTIWPY, Gwyndolyn Saxon, MD  Past Medical History:  Diagnosis Date   ADD (attention deficit disorder)    Allergy    Appendicitis    Arthritis    ASHD (arteriosclerotic heart disease)    Cancer ([place])    Colitis    Crohn's disease ([place])    Eczema    GERD (gastroesophageal reflux disease)    Hyperlipidemia    Hypertension    Left renal mass    Morbid obesity ([place])    Myocardial infarction [facility])    status post RCA stenting in [place] 2011   Positive for macroalbuminuria    Renal cell carcinoma, left ([place]) 08/22/2019   Dr. Risa Grill for left renal mass found during crohn's evaluation, had left partial nephrectomy 01/17/2015 and had + renal cell carcinoma     Past Surgical History:  Procedure Laterality Date   APPENDECTOMY     COLON SURGERY     CORONARY STENT PLACEMENT     x 1   ELBOW SURGERY     ROBOTIC ASSITED PARTIAL NEPHRECTOMY Left 01/17/2015   Procedure: ROBOTIC ASSITED LAPAROSCOPIC LEFT  PARTIAL  NEPHRECTOMY;  Surgeon: Ardis Hughs, MD;  Location: [facility];  Service: Urology;  Laterality: Left;    Family History  Problem Relation Age of Onset   Diabetes Maternal Grandmother    Crohn's disease Paternal Aunt    Crohn's disease Paternal Uncle    Colon cancer Neg Hx    Esophageal cancer Neg Hx    Rectal cancer Neg Hx    Stomach cancer Neg Hx    Pancreatic cancer Neg Hx    Liver cancer Neg Hx     Social History   Tobacco Use   Smoking status: Every Day    Packs/day: 1.00    Years: 30.00    Pack years: 30.00    Types: Cigarettes   Smokeless tobacco: Never  Vaping Use   Vaping Use: Never used  Substance Use Topics   Alcohol use: Yes    Comment: Occasional Beer   Drug use: Yes    Types: Marijuana    Comment: patient smoked one joint 2 days ago, h/o cocaine use 20 years ago.    No current facility-administered medications for this encounter.  Current Outpatient Medications:    albuterol (VENTOLIN HFA) 108 (90 Base) MCG/ACT inhaler, Inhale 1-2 puffs into the lungs every 4 (four) hours as needed for wheezing or shortness of breath., Disp: 1 each, Rfl: 0   amoxicillin-clavulanate (AUGMENTIN) 875-125  MG tablet, Take 1 tablet by mouth 2 (two) times daily. X 7 days, Disp: 14 tablet, Rfl: 0   fluticasone (FLONASE) 50 MCG/ACT nasal spray, Place 2 sprays into both nostrils daily., Disp: 16 g, Rfl: 0   predniSONE (DELTASONE) 20 MG tablet, Take 2 tablets (40 mg total) by mouth daily with breakfast for 5 days., Disp: 10 tablet, Rfl: 0   Spacer/Aero-Holding Chambers (AEROCHAMBER PLUS) inhaler, Use with inhaler, Disp: 1 each, Rfl: 2   amphetamine-dextroamphetamine (ADDERALL) 20 MG tablet, Take 1 tablet (20 mg total) by mouth 2 (two) times daily., Disp: 60 tablet, Rfl: 0   aspirin 81 MG chewable tablet, Chew by mouth., Disp: , Rfl:    atorvastatin (LIPITOR) 80 MG tablet, Take  1 tablet  Daily  for Cholesterol, Disp: 90 tablet, Rfl: 0   ferrous sulfate 325 (65 FE) MG tablet, Take 1 tablet (325 mg total) by mouth 2  (two) times daily. Patient needs office visit for further refills, Disp: 60 tablet, Rfl: 2   metoprolol tartrate (LOPRESSOR) 25 MG tablet, Take 1 tablet (25 mg total) by mouth 2 (two) times daily., Disp: 180 tablet, Rfl: 2   Multiple Vitamins-Minerals (MULTIVITAMIN WITH MINERALS) tablet, Take 1 tablet by mouth daily., Disp: , Rfl:   No Known Allergies   ROS  As noted in HPI.   Physical Exam  BP 135/89 (BP Location: Right Arm)   Pulse 67   Temp 98.9 F (37.2 C) (Oral)   Resp 17   SpO2 94%   Constitutional: Well developed, well nourished, no acute distress Eyes:  EOMI, conjunctiva normal bilaterally HENT: Normocephalic, atraumatic,mucus membranes moist.  Erythematous, swollen turbinates.  Purulent nasal congestion.  Positive maxillary, frontal sinus tenderness.  No obvious postnasal drip. Respiratory: Normal inspiratory effort, fair air movement, diffuse expiratory wheezing throughout all lung fields.  Positive anterior, lateral chest wall tenderness Cardiovascular: Normal rate, regular rhythm, no murmurs rubs or gallops. GI: nondistended skin: No rash, skin intact Musculoskeletal: no deformities Neurologic: Alert & oriented x 3, no focal neuro deficits Psychiatric: Speech and behavior appropriate   ED Course   Medications - No data to display  Orders Placed This Encounter  Procedures   Droplet precaution    Standing Status:   Standing    Number of Occurrences:   1   POC Influenza A & B Ag (Urgent Care)    Standing Status:   Standing    Number of Occurrences:   1    Results for orders placed or performed during the hospital encounter of 04/12/21 (from the past 24 hour(s))  POC Influenza A & B Ag (Urgent Care)     Status: None   Collection Time: 04/12/21  3:00 PM  Result Value Ref Range   INFLUENZA A ANTIGEN, POC NEGATIVE NEGATIVE   INFLUENZA B ANTIGEN, POC NEGATIVE NEGATIVE   No results found.  ED Clinical Impression  1. Acute non-recurrent pansinusitis   2.  Pulmonary emphysema, unspecified emphysema type [facility])      ED Assessment/Plan  Flu negative.  Suspect sinusitis, and emphysema exacerbation.  We talked about doing a chest x-ray, however, I am sending him home on antibiotics that would cover pneumonia anyway, so we decided to defer that today.  will get an x-ray if he is not improving, or if he gets worse.  Prednisone 40 mg for 5 days, regularly scheduled albuterol inhaler with spacer, saline nasal irrigation, Mucinex, Augmentin for 7 days.  Follow-up with PMD if not getting any better after  finishing the Augmentin, ER return precautions given  Discussed labs,  MDM, treatment plan, and plan for follow-up with patient. Discussed sn/sx that should prompt return to the ED. patient agrees with plan.   Meds ordered this encounter  Medications   albuterol (VENTOLIN HFA) 108 (90 Base) MCG/ACT inhaler    Sig: Inhale 1-2 puffs into the lungs every 4 (four) hours as needed for wheezing or shortness of breath.    Dispense:  1 each    Refill:  0   fluticasone (FLONASE) 50 MCG/ACT nasal spray    Sig: Place 2 sprays into both nostrils daily.    Dispense:  16 g    Refill:  0   Spacer/Aero-Holding Chambers (AEROCHAMBER PLUS) inhaler    Sig: Use with inhaler    Dispense:  1 each    Refill:  2    Please educate patient on use   predniSONE (DELTASONE) 20 MG tablet    Sig: Take 2 tablets (40 mg total) by mouth daily with breakfast for 5 days.    Dispense:  10 tablet    Refill:  0   amoxicillin-clavulanate (AUGMENTIN) 875-125 MG tablet    Sig: Take 1 tablet by mouth 2 (two) times daily. X 7 days    Dispense:  14 tablet    Refill:  0      *This clinic note was created using [employer]. Therefore, there may be occasional mistakes despite careful proofreading.  ?    Melynda Ripple, MD 04/13/21 [phone]

## 2021-04-12 NOTE — Discharge Instructions (Addendum)
Prednisone 40 mg for 5 days, 2 puffs from your albuterol inhaler every 4 hours for 2 days, then every 6 hours for 2 days, then as needed.  You may back off on the albuterol if you start to feel better sooner., saline nasal irrigation with a Milta Deiters Med rinse and distilled water as often as you want, Mucinex, finish the Augmentin, even if you feel better.  Follow-up with your doctor or here if not getting any better after finishing the Augmentin, go to the ED if you get worse.

## 2021-07-03 NOTE — Progress Notes (Signed)
FOLLOW UP 6 MONTH   Assessment and Plan:   Essential hypertension - continue medications, DASH diet, exercise and monitor at home. Call if greater than 130/80.  - CBC with Differential/Platelet - CMP   ASHD (arteriosclerotic heart disease) Control blood pressure, cholesterol, glucose, increase exercise.  Stop smoking   Hyperlipidemia -continue medications, check lipids, decrease fatty foods, increase activity.  - Lipid panel   Crohn's disease of ileum, unspecified complication ([place]) Continue GI follow up   Vitamin D deficiency Continue Vit D supplementation to maintain value in therapeutic level of 60-100    ADD (attention deficit disorder) - amphetamine-dextroamphetamine (ADDERALL) 20 MG tablet; Take 1 tablet (20 mg total) by mouth 2 (two) times daily.  Dispense: 60 tablet; Refill: 0  Smoker Advised to quit smoking, not willing at this time.  Will continue to assess readiness.   Order Yearly CT screening.   Medication management Continued - MAgnesium   Call patient with lab results  Further disposition pending results if labs check today. Discussed med's effects and SE's.   Over 30 minutes of face to face interview, exam, counseling, chart review, and critical decision making was performed.     Future Appointments  Date Time Provider Justin  01/07/2022  9:00 AM Magda Bernheim, NP [facility] None    HPI 55 y.o. smoking whit male  3 month follow up with hypertension, hyperlipidemia, prediabetes, CAD s/p stent  and vitamin D.   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  He denies chest pain, shortness of breath, dizziness.    He does use albuterol 1-2 x a month.    BMI is Body mass index is 33.4 kg/m., he is working on diet  and exercise. Not exercising as much . He is not focusing as much on diet.  Has been eating too much red meat and sweet tea.  Wt Readings from Last 3 Encounters:  07/08/21 226 lb 3.2 oz (102.6 kg)  01/07/21 215 lb 3.2 oz (97.6 kg)  06/18/20 225 lb (102.1 kg)   Crohn's follows with Dr. [name]. No recent appointment going well, no flares.  He previously followed with Dr. Risa Grill for left renal mass found during crohn's evaluation, had left partial nephrectomy 01/17/2015 and had + renal cell carcinoma.  Has not had recent appointment.  He continues to smoke but would like to quit. Has done patches in the past and will likely try that. Granddaughter is 50 months old, going to have heart surgery for likely VSD. Has tried chantix without any help in the past.   He has a history of ASHD S/P Stenting in 2010.  He is on bASA.  Denies dyspnea, exertional chest pressure/discomfort and irregular heart beat.   He is on cholesterol medication, lipitor 39m and denies myalgias. His cholesterol is at goal. The cholesterol last visit was:   Lab Results  Component Value Date   CHOL 147 01/07/2021   HDL 36 (L) 01/07/2021   LDLCALC 80 01/07/2021   TRIG 215 (H) 01/07/2021   CHOLHDL 4.1 01/07/2021   He has been working on diet and exercise for prediabetes, and denies paresthesia of the feet, polydipsia, polyuria and visual disturbances. Last A1C in the office was:  Lab Results  Component  Value Date   HGBA1C 5.4 01/07/2021       Current Medications:     Current Outpatient Medications (Cardiovascular):    atorvastatin (LIPITOR) 80 MG tablet, Take  1 tablet  Daily  for Cholesterol   metoprolol tartrate (LOPRESSOR) 25 MG tablet, Take 1 tablet (25 mg total) by mouth 2 (two) times daily.  Current Outpatient Medications (Respiratory):    albuterol (VENTOLIN HFA) 108 (90 Base) MCG/ACT inhaler, Inhale 1-2 puffs into the lungs every 4 (four) hours as needed for wheezing or shortness of breath.   fluticasone  (FLONASE) 50 MCG/ACT nasal spray, Place 2 sprays into both nostrils daily.  Current Outpatient Medications (Analgesics):    aspirin 81 MG chewable tablet, Chew by mouth.  Current Outpatient Medications (Hematological):    ferrous sulfate 325 (65 FE) MG tablet, Take 1 tablet (325 mg total) by mouth 2 (two) times daily. Patient needs office visit for further refills  Current Outpatient Medications (Other):    amphetamine-dextroamphetamine (ADDERALL) 20 MG tablet, Take 1 tablet (20 mg total) by mouth 2 (two) times daily.   Multiple Vitamins-Minerals (MULTIVITAMIN WITH MINERALS) tablet, Take 1 tablet by mouth daily.   Spacer/Aero-Holding Chambers (AEROCHAMBER PLUS) inhaler, Use with inhaler   amoxicillin-clavulanate (AUGMENTIN) 875-125 MG tablet, Take 1 tablet by mouth 2 (two) times daily. X 7 days (Patient not taking: Reported on 07/08/2021)  Medical History:  Past Medical History:  Diagnosis Date   ADD (attention deficit disorder)    Allergy    Appendicitis    Arthritis    ASHD (arteriosclerotic heart disease)    Cancer ([facility])    Colitis    Crohn's disease ([place])    Eczema    GERD (gastroesophageal reflux disease)    Hyperlipidemia    Hypertension    Left renal mass    Morbid obesity ([place])    Myocardial infarction ([place])    status post RCA stenting in [place] 2011   Positive for macroalbuminuria    Renal cell carcinoma, left ([place]) 08/22/2019   Dr. Risa Grill for left renal mass found during crohn's evaluation, had left partial nephrectomy 01/17/2015 and had + renal cell carcinoma    Allergies No Known Allergies  SURGICAL HISTORY He  has a past surgical history that includes Appendectomy; Coronary stent placement; Colon surgery; Robotic assited partial nephrectomy (Left, 01/17/2015); and Elbow surgery. FAMILY HISTORY His family history includes Crohn's disease in his paternal aunt and paternal uncle; Diabetes in his maternal grandmother. SOCIAL HISTORY He  reports that he has been  smoking cigarettes. He has a 30.00 pack-year smoking history. He has never used smokeless tobacco. He reports current alcohol use. He reports current drug use. Drug: Marijuana.   Immunization History  Administered Date(s) Administered   Td 05/19/2009   Tdap 12/05/2019   Health Maintenance  Topic Date Due   COVID-19 Vaccine (1) Never done   HIV Screening  Never done   Hepatitis C Screening  Never done   Zoster Vaccines- Shingrix (1 of 2) Never done   COLONOSCOPY (Pts 45-86yr Insurance coverage will need to be confirmed)  06/08/2022   TETANUS/TDAP  12/04/2029   HPV VACCINES  Aged Out   INFLUENZA VACCINE  Discontinued    Declines the influenza vaccine and COVID vaccines  Colonoscopy 05/2017 Dr. PHenrene Pastordue 5 years EGD: 04/2017 normal Ct AB pelvis 03/17/2014 CXR 03/2018 MRI AB 08/2014 Echo 12/2014  Dr. MSabra Heckeye, 2-3 months, 10/2019 yearly  Review of Systems:  Review of Systems  Constitutional: Negative.  Negative for chills, fever and weight loss.  HENT: Negative.  Negative for congestion and hearing loss.   Eyes: Negative.  Negative for blurred vision and double vision.  Respiratory:  Negative for cough, hemoptysis, sputum production, shortness of breath and wheezing.   Cardiovascular: Negative.  Negative for chest pain, palpitations, orthopnea and leg swelling.  Gastrointestinal: Negative.  Negative for abdominal pain, constipation, diarrhea, heartburn, nausea and vomiting.  Genitourinary: Negative.   Musculoskeletal: Negative.  Negative for falls, joint pain and myalgias.  Skin:  Negative for rash.  Neurological: Negative.  Negative for dizziness, tingling, tremors, loss of consciousness and headaches.  Endo/Heme/Allergies: Negative.   Psychiatric/Behavioral: Negative.  Negative for depression, memory loss and suicidal ideas.   Physical Exam: BP 138/88    Pulse 64    Temp 97.9 F (36.6 C)    Wt 226 lb 3.2 oz (102.6 kg)    SpO2 97%    BMI 33.40 kg/m  Wt Readings from  Last 3 Encounters:  07/08/21 226 lb 3.2 oz (102.6 kg)  01/07/21 215 lb 3.2 oz (97.6 kg)  06/18/20 225 lb (102.1 kg)   General Appearance: Well nourished, in no apparent distress. Eyes: PERRLA, EOMs, conjunctiva no swelling or erythema Sinuses: No Frontal/maxillary tenderness ENT/Mouth: Ext aud canals clear, TMs without erythema, bulging. No erythema, swelling, or exudate on post pharynx.  Tonsils not swollen or erythematous. Hearing normal.  Neck: Supple, thyroid normal.  Respiratory: Respiratory effort normal, BS equal bilaterally without rales, rhonchi, wheezing or stridor.  Cardio: RRR with no MRGs. Brisk peripheral pulses without edema.  Abdomen: Soft, + BS, obese,  Non tender, no guarding, rebound, hernias, masses. Lymphatics: Non tender without lymphadenopathy.  Musculoskeletal: Full ROM, 5/5 strength, Normal Skin: Several healed scars along AB. Warm, dry without rashes, lesions, ecchymosis.  Neuro: Cranial nerves intact. Normal muscle tone except some minor muscle wasting lateral right hand with decrease extension of 3 lateral fingers, normal distal vascular, no cerebellar symptoms. Psych: Awake and oriented X 3, normal affect, Insight and Judgment appropriate.    Magda Bernheim ANP-C  [facility]  07/08/2021

## 2021-07-08 ENCOUNTER — Ambulatory Visit: Payer: Federal, State, Local not specified - PPO | Admitting: Nurse Practitioner

## 2021-07-08 ENCOUNTER — Encounter: Payer: Self-pay | Admitting: Nurse Practitioner

## 2021-07-08 ENCOUNTER — Other Ambulatory Visit: Payer: Self-pay

## 2021-07-08 VITALS — BP 138/88 | HR 64 | Temp 97.9°F | Wt 226.2 lb

## 2021-07-08 DIAGNOSIS — I700 Atherosclerosis of aorta: Secondary | ICD-10-CM | POA: Diagnosis not present

## 2021-07-08 DIAGNOSIS — K50019 Crohn's disease of small intestine with unspecified complications: Secondary | ICD-10-CM

## 2021-07-08 DIAGNOSIS — E785 Hyperlipidemia, unspecified: Secondary | ICD-10-CM | POA: Diagnosis not present

## 2021-07-08 DIAGNOSIS — F17200 Nicotine dependence, unspecified, uncomplicated: Secondary | ICD-10-CM

## 2021-07-08 DIAGNOSIS — Z79899 Other long term (current) drug therapy: Secondary | ICD-10-CM

## 2021-07-08 DIAGNOSIS — I10 Essential (primary) hypertension: Secondary | ICD-10-CM | POA: Diagnosis not present

## 2021-07-08 DIAGNOSIS — E559 Vitamin D deficiency, unspecified: Secondary | ICD-10-CM

## 2021-07-08 DIAGNOSIS — F909 Attention-deficit hyperactivity disorder, unspecified type: Secondary | ICD-10-CM

## 2021-07-08 LAB — COMPLETE METABOLIC PANEL WITH GFR
AG Ratio: 1.2 (calc) (ref 1.0–2.5)
ALT: 23 U/L (ref 9–46)
AST: 16 U/L (ref 10–35)
Albumin: 4.1 g/dL (ref 3.6–5.1)
Alkaline phosphatase (APISO): 94 U/L (ref 35–144)
BUN: 15 mg/dL (ref 7–25)
CO2: 26 mmol/L (ref 20–32)
Calcium: 9.3 mg/dL (ref 8.6–10.3)
Chloride: 104 mmol/L (ref 98–110)
Creat: 0.8 mg/dL (ref 0.70–1.30)
Globulin: 3.3 g/dL (calc) (ref 1.9–3.7)
Glucose, Bld: 92 mg/dL (ref 65–99)
Potassium: 4.5 mmol/L (ref 3.5–5.3)
Sodium: 136 mmol/L (ref 135–146)
Total Bilirubin: 0.3 mg/dL (ref 0.2–1.2)
Total Protein: 7.4 g/dL (ref 6.1–8.1)
eGFR: 104 mL/min/{1.73_m2} (ref >=60)

## 2021-07-08 LAB — CBC WITH DIFFERENTIAL/PLATELET
Absolute Monocytes: 474 cells/uL (ref 200–950)
Basophils Absolute: 52 cells/uL (ref 0–200)
Basophils Relative: 0.7 %
Eosinophils Absolute: 192 cells/uL (ref 15–500)
Eosinophils Relative: 2.6 %
HCT: 44.2 % (ref 38.5–50.0)
Hemoglobin: 14.8 g/dL (ref 13.2–17.1)
Lymphs Abs: 1066 cells/uL (ref 850–3900)
MCH: 30.8 pg (ref 27.0–33.0)
MCHC: 33.5 g/dL (ref 32.0–36.0)
MCV: 92.1 fL (ref 80.0–100.0)
MPV: 10.2 fL (ref 7.5–12.5)
Monocytes Relative: 6.4 %
Neutro Abs: 5617 cells/uL (ref 1500–7800)
Neutrophils Relative %: 75.9 %
Platelets: 310 10*3/uL (ref 140–400)
RBC: 4.8 10*6/uL (ref 4.20–5.80)
RDW: 13.1 % (ref 11.0–15.0)
Total Lymphocyte: 14.4 %
WBC: 7.4 10*3/uL (ref 3.8–10.8)

## 2021-07-08 LAB — LIPID PANEL
Cholesterol: 177 mg/dL (ref <200)
HDL: 40 mg/dL (ref >=40)
LDL Cholesterol (Calc): 115 mg/dL (calc) — ABNORMAL HIGH
Non-HDL Cholesterol (Calc): 137 mg/dL (calc) — ABNORMAL HIGH (ref <130)
Total CHOL/HDL Ratio: 4.4 (calc) (ref <5.0)
Triglycerides: 111 mg/dL (ref <150)

## 2021-07-08 LAB — MAGNESIUM: Magnesium: 1.9 mg/dL (ref 1.5–2.5)

## 2021-07-08 MED ORDER — ALBUTEROL SULFATE HFA 108 (90 BASE) MCG/ACT IN AERS: 1.0000 | INHALATION_SPRAY | RESPIRATORY_TRACT | 0 refills | Status: DC | PRN

## 2021-07-08 MED ORDER — AMPHETAMINE-DEXTROAMPHETAMINE 20 MG PO TABS: 20.0000 mg | ORAL_TABLET | Freq: Two times a day (BID) | ORAL | 0 refills | Status: DC

## 2021-07-08 MED ORDER — AEROCHAMBER PLUS MISC: 2 refills | Status: DC

## 2021-08-07 ENCOUNTER — Emergency Department
Admission: EM | Admit: 2021-08-07 | Discharge: 2021-08-08 | Disposition: A | Discharge status: Home / Self Care | Payer: Federal, State, Local not specified - PPO | Attending: Emergency Medicine | Admitting: Emergency Medicine

## 2021-08-07 ENCOUNTER — Other Ambulatory Visit: Payer: Self-pay

## 2021-08-07 ENCOUNTER — Emergency Department: Payer: Federal, State, Local not specified - PPO

## 2021-08-07 DIAGNOSIS — K5010 Crohn's disease of large intestine without complications: Secondary | ICD-10-CM

## 2021-08-07 DIAGNOSIS — N281 Cyst of kidney, acquired: Secondary | ICD-10-CM | POA: Diagnosis not present

## 2021-08-07 DIAGNOSIS — I10 Essential (primary) hypertension: Secondary | ICD-10-CM | POA: Insufficient documentation

## 2021-08-07 DIAGNOSIS — R1031 Right lower quadrant pain: Secondary | ICD-10-CM

## 2021-08-07 DIAGNOSIS — K5790 Diverticulosis of intestine, part unspecified, without perforation or abscess without bleeding: Secondary | ICD-10-CM | POA: Diagnosis not present

## 2021-08-07 DIAGNOSIS — K5090 Crohn's disease, unspecified, without complications: Secondary | ICD-10-CM | POA: Diagnosis not present

## 2021-08-07 DIAGNOSIS — N3289 Other specified disorders of bladder: Secondary | ICD-10-CM | POA: Diagnosis not present

## 2021-08-07 LAB — CBC
HCT: 42.3 % (ref 39.0–52.0)
Hemoglobin: 14.2 g/dL (ref 13.0–17.0)
MCH: 30.2 pg (ref 26.0–34.0)
MCHC: 33.6 g/dL (ref 30.0–36.0)
MCV: 90 fL (ref 80.0–100.0)
Platelets: 204 10*3/uL (ref 150–400)
RBC: 4.7 MIL/uL (ref 4.22–5.81)
RDW: 14.3 % (ref 11.5–15.5)
WBC: 6.9 10*3/uL (ref 4.0–10.5)
nRBC: 0 % (ref 0.0–0.2)

## 2021-08-07 LAB — LIPASE, BLOOD: Lipase: 45 U/L (ref 11–51)

## 2021-08-07 LAB — COMPREHENSIVE METABOLIC PANEL
ALT: 32 U/L (ref 0–44)
AST: 25 U/L (ref 15–41)
Albumin: 3.6 g/dL (ref 3.5–5.0)
Alkaline Phosphatase: 84 U/L (ref 38–126)
Anion gap: 8 (ref 5–15)
BUN: 21 mg/dL — ABNORMAL HIGH (ref 6–20)
CO2: 23 mmol/L (ref 22–32)
Calcium: 8.5 mg/dL — ABNORMAL LOW (ref 8.9–10.3)
Chloride: 102 mmol/L (ref 98–111)
Creatinine, Ser: 1.13 mg/dL (ref 0.61–1.24)
GFR, Estimated: >60 mL/min (ref >60)
Glucose, Bld: 129 mg/dL — ABNORMAL HIGH (ref 70–99)
Potassium: 3.5 mmol/L (ref 3.5–5.1)
Sodium: 133 mmol/L — ABNORMAL LOW (ref 135–145)
Total Bilirubin: 0.6 mg/dL (ref 0.3–1.2)
Total Protein: 7.9 g/dL (ref 6.5–8.1)

## 2021-08-07 LAB — URINALYSIS, ROUTINE W REFLEX MICROSCOPIC
Bacteria, UA: NONE SEEN
Bilirubin Urine: NEGATIVE
Glucose, UA: NEGATIVE mg/dL
Ketones, ur: NEGATIVE mg/dL
Leukocytes,Ua: NEGATIVE
Nitrite: NEGATIVE
Protein, ur: 100 mg/dL — AB
Specific Gravity, Urine: 1.026 (ref 1.005–1.030)
Squamous Epithelial / HPF: NONE SEEN (ref 0–5)
pH: 5 (ref 5.0–8.0)

## 2021-08-07 MED ORDER — IOHEXOL 300 MG/ML  SOLN
125.0000 mL | Freq: Once | INTRAMUSCULAR | Status: AC | PRN
  Administered 2021-08-07: 125 mL via INTRAVENOUS

## 2021-08-07 MED ORDER — KETOROLAC TROMETHAMINE 30 MG/ML IJ SOLN
15.0000 mg | Freq: Once | INTRAMUSCULAR | Status: AC
Start: 2021-08-07 — End: 2021-08-07
  Administered 2021-08-07: 15 mg via INTRAVENOUS
  Filled 2021-08-07: qty 1

## 2021-08-07 MED ORDER — METHYLPREDNISOLONE SODIUM SUCC 125 MG IJ SOLR
125.0000 mg | INTRAMUSCULAR | Status: AC
  Administered 2021-08-07: 125 mg via INTRAVENOUS
  Filled 2021-08-07: qty 2

## 2021-08-07 MED ORDER — LACTATED RINGERS IV BOLUS
1000.0000 mL | Freq: Once | INTRAVENOUS | Status: AC
Start: 2021-08-07 — End: 2021-08-07
  Administered 2021-08-07: 1000 mL via INTRAVENOUS

## 2021-08-07 NOTE — ED Triage Notes (Signed)
Pt presents to ER c/o RLQ pain that started yesterday.  Pt states he has also been having some yellow stool as well.  Pt states he has previous hx of appendectomy and partial colon removal several years ago.  Pt does endorse a fever that started yesterday as well.  Pt states he last took motrin 45 minutes ago.  Denies vomiting but states he has been vomiting and had some diarrhea as well.  Pt is A&O x4 at this time and ambulatory to triage in NAD.   ?

## 2021-08-08 ENCOUNTER — Encounter: Payer: Self-pay | Admitting: Internal Medicine

## 2021-08-08 MED ORDER — METRONIDAZOLE 500 MG PO TABS: 500.0000 mg | ORAL_TABLET | Freq: Three times a day (TID) | ORAL | 0 refills | Status: AC

## 2021-08-08 MED ORDER — PREDNISONE 20 MG PO TABS: 40.0000 mg | ORAL_TABLET | Freq: Every day | ORAL | 0 refills | Status: AC

## 2021-08-08 MED ORDER — ONDANSETRON 4 MG PO TBDP: 4.0000 mg | ORAL_TABLET | Freq: Three times a day (TID) | ORAL | 0 refills | Status: DC | PRN

## 2021-08-08 NOTE — ED Provider Notes (Signed)
? ?[facility] ?Provider Note ? ? ? Event Date/Time  ? First MD Initiated Contact with Patient 08/07/21 2119   ?  (approximate) ? ? ?History  ? ?Abdominal Pain ? ? ?HPI ? ?Cole [name] is a 56 y.o. male with a history of hyperlipidemia, hypertension, Crohn's disease status post right hemicolectomy who comes ED complaining of right lower quadrant abdominal pain that started yesterday, gradual onset, constant, worse with movement, associated with some abnormal bowel movements.  Also reports a fever yesterday.  He has been taking ibuprofen today which has controlled the fever.  Also endorses some vomiting. ?  ? ? ?Physical Exam  ? ?Triage Vital Signs: ?ED Triage Vitals  ?Enc Vitals Group  ?   BP 08/07/21 2050 (!) 152/88  ?   Pulse Rate 08/07/21 2050 (!) 113  ?   Resp 08/07/21 2050 17  ?   Temp 08/07/21 2050 98.8 ?F (37.1 ?C)  ?   Temp src --   ?   SpO2 08/07/21 2050 96 %  ?   Weight 08/07/21 2051 240 lb (108.9 kg)  ?   Height 08/07/21 2051 5' 10"  (1.778 m)  ?   Head Circumference --   ?   Peak Flow --   ?   Pain Score 08/07/21 2051 7  ?   Pain Loc --   ?   Pain Edu? --   ?   Excl. in [place]? --   ? ? ?Most recent vital signs: ?Vitals:  ? 08/07/21 2050 08/07/21 2200  ?BP: (!) 152/88 122/73  ?Pulse: (!) 113 91  ?Resp: 17 14  ?Temp: 98.8 ?F (37.1 ?C)   ?SpO2: 96% 97%  ? ? ? ?General: Awake, no distress.  ?CV:  Good peripheral perfusion.  Regular rate and rhythm ?Resp:  Normal effort.  Clear to auscultation bilaterally ?Abd:  No distention.  Soft with mild tenderness in the deep right lower abdomen.  No hernia.  There is a small tender right inguinal lymph node ?Other:  No lower extremity edema, no rash.  Moist oral mucosa. ? ? ?ED Results / Procedures / Treatments  ? ?Labs ?(all labs ordered are listed, but only abnormal results are displayed) ?Labs Reviewed  ?COMPREHENSIVE METABOLIC PANEL - Abnormal; Notable for the following components:  ?    Result Value  ? Sodium 133 (*)   ? Glucose, Bld 129 (*)    ? BUN 21 (*)   ? Calcium 8.5 (*)   ? All other components within normal limits  ?URINALYSIS, ROUTINE W REFLEX MICROSCOPIC - Abnormal; Notable for the following components:  ? Color, Urine YELLOW (*)   ? APPearance HAZY (*)   ? Hgb urine dipstick SMALL (*)   ? Protein, ur 100 (*)   ? All other components within normal limits  ?LIPASE, BLOOD  ?CBC  ? ? ? ?EKG ? ? ? ? ?RADIOLOGY ?CT abdomen pelvis viewed and interpreted by me, appears unremarkable.  No perforation bowel obstruction or abscess.  Radiology report reviewed which notes some colitis of the distal Neo terminal ileum consistent with Crohn's colitis. ? ? ? ?PROCEDURES: ? ?Critical Care performed: No ? ?Procedures ? ? ?MEDICATIONS ORDERED IN ED: ?Medications  ?lactated ringers bolus 1,000 mL (1,000 mLs Intravenous New Bag/Given 08/07/21 2240)  ?ketorolac (TORADOL) 30 MG/ML injection 15 mg (15 mg Intravenous Given 08/07/21 2241)  ?iohexol (OMNIPAQUE) 300 MG/ML solution 125 mL (125 mLs Intravenous Contrast Given 08/07/21 2205)  ?methylPREDNISolone sodium succinate (SOLU-MEDROL) 125 mg/2 mL  injection 125 mg (125 mg Intravenous Given 08/07/21 2251)  ? ? ? ?IMPRESSION / MDM / ASSESSMENT AND PLAN / ED COURSE  ?I reviewed the triage vital signs and the nursing notes. ?             ?               ? ?Differential diagnosis includes, but is not limited to, diverticulitis, constipation, hernia, intra-abdominal abscess ? ? ? ?Patient presents with right lower quadrant pain and tenderness, reported fever at home.  Vitals and labs today are unremarkable, he is nontoxic.  CT scan shows Crohn's colitis which is a recurrent issue for him despite previous surgery.  Patient given Solu-Medrol in the ED, will continue him on Flagyl and prednisone at home and recommend he follow-up with his gastroenterologist. ?  ? ? ?FINAL CLINICAL IMPRESSION(S) / ED DIAGNOSES  ? ?Final diagnoses:  ?Right lower quadrant abdominal pain  ?Crohn's disease of colon without complication ([place])  ? ? ? ?Rx /  DC Orders  ? ?ED Discharge Orders   ? ?      Ordered  ?  predniSONE (DELTASONE) 20 MG tablet  Daily with breakfast       ? 08/08/21 0002  ?  metroNIDAZOLE (FLAGYL) 500 MG tablet  3 times daily       ? 08/08/21 0002  ? ?  ?  ? ?  ? ? ? ?Note:  This document was prepared using Dragon voice recognition software and may include unintentional dictation errors. ?  ?Carrie Mew, MD ?08/08/21 0005 ? ?

## 2021-08-19 ENCOUNTER — Other Ambulatory Visit (INDEPENDENT_AMBULATORY_CARE_PROVIDER_SITE_OTHER): Payer: Federal, State, Local not specified - PPO

## 2021-08-19 ENCOUNTER — Ambulatory Visit: Payer: Federal, State, Local not specified - PPO | Admitting: Gastroenterology

## 2021-08-19 ENCOUNTER — Encounter: Payer: Self-pay | Admitting: Gastroenterology

## 2021-08-19 VITALS — BP 118/78 | HR 66 | Ht 70.0 in | Wt 239.0 lb

## 2021-08-19 DIAGNOSIS — D509 Iron deficiency anemia, unspecified: Secondary | ICD-10-CM

## 2021-08-19 DIAGNOSIS — K50019 Crohn's disease of small intestine with unspecified complications: Secondary | ICD-10-CM

## 2021-08-19 DIAGNOSIS — R1031 Right lower quadrant pain: Secondary | ICD-10-CM

## 2021-08-19 LAB — SEDIMENTATION RATE: Sed Rate: 95 mm/hr — ABNORMAL HIGH (ref 0–20)

## 2021-08-19 LAB — IBC + FERRITIN
Ferritin: 253.2 ng/mL (ref 22.0–322.0)
Iron: 73 ug/dL (ref 42–165)
Saturation Ratios: 23.7 % (ref 20.0–50.0)
TIBC: 308 ug/dL (ref 250.0–450.0)
Transferrin: 220 mg/dL (ref 212.0–360.0)

## 2021-08-19 LAB — C-REACTIVE PROTEIN: CRP: 2.4 mg/dL (ref 0.5–20.0)

## 2021-08-19 NOTE — Progress Notes (Signed)
? ? ? ?08/19/2021 ?Yedidya Duddy ?[id number] ?[birth year] ? ? ?HISTORY OF PRESENT ILLNESS: This is a 56 year old male with ileal Crohn's disease status post ileocecectomy in [place], [place] in August 2011 when he presented with suppurative appendicitis with associated phlegmon.  The pathology revealed changes consistent with Crohn's disease.  He was seen by Dr. [name] 01/09/2014 for chronic diarrhea and the need for colonoscopy.  Colonoscopy was performed 02/20/2014.  The anastomosis was stenotic and ulcerated.  Biopsies were consistent with inflammatory bowel disease.  Subsequent small bowel follow-through revealed changes consistent with Crohn's disease involving the terminal ileum, 5 cm segment.  Subsequent CT scan revealed some inflammation at the level of anastomosis.  He has had suboptimal follow-up.  August 2017 he opted for surveillance.  Subsequently seen December 2018 by one of our nurse practitioners regarding iron deficiency anemia with hemoglobin 9.7 g.  Upper endoscopy performed 05/07/2017 that was normal except for incidental esophageal ring.  Then underwent complete colonoscopy 06/08/2017 was found to have stenosis and ulceration at the level of the anastomosis as previous.  No ileal intubation was performed.  He was placed on iron supplementation.  He responded well to that.  He was then last seen by Dr. [name] on 10/19/2017 at which time he once again continued to request surveillance for his Crohn's disease. ? ?Now he is back here today for recent ER follow-up.  He tells me that he had sudden onset of right-sided abdominal pain and went to the emergency department on 08/07/2021 at which time he had a CT scan of the abdomen and pelvis with contrast that showed the following: ? ? ?IMPRESSION: ?Active inflammatory Crohn's disease involving the neoterminal ileum. ?  ?Status post ileocecal resection with appendectomy. ?  ?Additional stable ancillary findings as above. ? ?They gave him a course of  prednisone/steroids and some antibiotics, both of which he has completed.  He tells me that his pain has resolved.  He does, however, report chronic diarrhea that is described as explosive in nature, at least 6 bowel movements a day.  Really never any formed stools.  He denies seeing blood.  He tells me that he has been using an oregano oil extract mixed with olive oil and rubbing it on his abdomen for his Crohn's disease.  He says that he saw somebody who did it and cured his Crohn's disease that way. ? ?Past Medical History:  ?Diagnosis Date  ? ADD (attention deficit disorder)   ? Allergy   ? Appendicitis   ? Arthritis   ? ASHD (arteriosclerotic heart disease)   ? Cancer [facility])   ? Colitis   ? Crohn's disease ([place])   ? Eczema   ? GERD (gastroesophageal reflux disease)   ? Hyperlipidemia   ? Hypertension   ? Left renal mass   ? Morbid obesity ([place])   ? Myocardial infarction [facility])   ? status post RCA stenting in [facility] 2011  ? Positive for macroalbuminuria   ? Renal cell carcinoma, left ([place]) 08/22/2019  ? Dr. Risa Grill for left renal mass found during crohn's evaluation, had left partial nephrectomy 01/17/2015 and had + renal cell carcinoma   ? ?Past Surgical History:  ?Procedure Laterality Date  ? APPENDECTOMY    ? COLON SURGERY    ? CORONARY STENT PLACEMENT    ? x 1  ? ELBOW SURGERY    ? ROBOTIC ASSITED PARTIAL NEPHRECTOMY Left 01/17/2015  ? Procedure: ROBOTIC ASSITED LAPAROSCOPIC LEFT  PARTIAL  NEPHRECTOMY;  Surgeon:  Ardis Hughs, MD;  Location: [facility];  Service: Urology;  Laterality: Left;  ? ? reports that he has been smoking cigarettes. He has a 30.00 pack-year smoking history. He has never used smokeless tobacco. He reports current alcohol use. He reports current drug use. Drug: Marijuana. ?family history includes Crohn's disease in his paternal aunt and paternal uncle; Diabetes in his maternal grandmother. ?No Known Allergies ? ?  ?Outpatient Encounter Medications as of 08/19/2021  ?Medication Sig  ?  albuterol (VENTOLIN HFA) 108 (90 Base) MCG/ACT inhaler Inhale 1-2 puffs into the lungs every 4 (four) hours as needed for wheezing or shortness of breath.  ? amphetamine-dextroamphetamine (ADDERALL) 20 MG tablet Take 1 tablet (20 mg total) by mouth 2 (two) times daily.  ? aspirin 81 MG chewable tablet Chew by mouth.  ? atorvastatin (LIPITOR) 80 MG tablet Take  1 tablet  Daily  for Cholesterol  ? ferrous sulfate 325 (65 FE) MG tablet Take 1 tablet (325 mg total) by mouth 2 (two) times daily. Patient needs office visit for further refills  ? metoprolol tartrate (LOPRESSOR) 25 MG tablet Take 1 tablet (25 mg total) by mouth 2 (two) times daily.  ? Multiple Vitamins-Minerals (MULTIVITAMIN WITH MINERALS) tablet Take 1 tablet by mouth daily.  ? ondansetron (ZOFRAN-ODT) 4 MG disintegrating tablet Take 1 tablet (4 mg total) by mouth every 8 (eight) hours as needed for nausea or vomiting.  ? Spacer/Aero-Holding Chambers (AEROCHAMBER PLUS) inhaler Use with inhaler  ? [DISCONTINUED] fluticasone (FLONASE) 50 MCG/ACT nasal spray Place 2 sprays into both nostrils daily.  ? ?No facility-administered encounter medications on file as of 08/19/2021.  ? ? ? ?REVIEW OF SYSTEMS  : All other systems reviewed and negative except where noted in the History of Present Illness. ? ? ?PHYSICAL EXAM: ?Pulse 66   Ht 5' 10"  (1.778 m)   Wt 239 lb (108.4 kg)   SpO2 95%   BMI 34.29 kg/m?  ?General: Well developed white male in no acute distress ?Head: Normocephalic and atraumatic ?Eyes:  Sclerae anicteric, conjunctiva pink. ?Ears: Normal auditory acuity ?Lungs: Clear throughout to auscultation; no W/R/R. ?Heart: Regular rate and rhythm; no M/R/G. ?Abdomen: Soft, non-distended.  BS present, somewhat hyperactive.  Non-tender. ?Musculoskeletal: Symmetrical with no gross deformities  ?Skin: No lesions on visible extremities ?Extremities: No edema  ?Neurological: Alert oriented x 4, grossly non-focal ?Psychological:  Alert and cooperative. Normal mood  and affect ? ?ASSESSMENT AND PLAN: ?#1. Ileal Crohn's status post ileocecectomy elsewhere 2011. Noted in short segment of active disease on prior postsurgical workup. Patient opted for ongoing active surveillance despite the fact that he had iron deficiency anemia related to his disease.  Now recently had rather sudden onset severe right lower quadrant abdominal pain.  CT scan showed active inflammatory Crohn's disease involving the neoterminal ileum.  Was treated with a course of steroids and antibiotics.  Pain now improved and actually resolved.  Chronically has loose stool/diarrhea.  He seems very reluctant to want to go on medication/treatment for his Crohn's.  We will check iron studies, sed rate, CRP, and fecal calprotectin.  We will plan for colonoscopy with Dr. [name] since last visit 2019.  Further discussions can be had pending those results. ?#2. Iron deficiency anemia related to ileal Crohn's and anastomotic ulceration. Good response to iron previously.  As above we will recheck iron levels.  Hemoglobin recently normal. ? ?**The risks, benefits, and alternatives to colonoscopy were discussed with the patient and he consents to proceed.  ? ? ?  CC:  Unk Pinto, MD ? ?  ?

## 2021-08-19 NOTE — Patient Instructions (Signed)
Your provider has requested that you go to the basement level for lab work before leaving today. Press "B" on the elevator. The lab is located at the first door on the left as you exit the elevator. ? ?You have been scheduled for a colonoscopy. Please follow written instructions given to you at your visit today.  ?Please pick up your prep supplies at the pharmacy within the next 1-3 days. ?If you use inhalers (even only as needed), please bring them with you on the day of your procedure. ? ?If you are age 54 or younger, your body mass index should be between 19-25. Your Body mass index is 34.29 kg/m?Marland Kitchen If this is out of the aformentioned range listed, please consider follow up with your Primary Care Provider.  ? ?________________________________________________________ ? ?The [facility] providers would like to encourage you to use [facility] to communicate with providers for non-urgent requests or questions.  Due to long hold times on the telephone, sending your provider a message by [facility] may be a faster and more efficient way to get a response.  Please allow 48 business hours for a response.  Please remember that this is for non-urgent requests.  ?_______________________________________________________ ? ?

## 2021-08-20 ENCOUNTER — Encounter: Payer: Self-pay | Admitting: Gastroenterology

## 2021-08-20 DIAGNOSIS — R1031 Right lower quadrant pain: Secondary | ICD-10-CM | POA: Insufficient documentation

## 2021-08-20 NOTE — Progress Notes (Signed)
Reviewed.  Has not followed up as requested.  Agree with plans for colonoscopy and blood work ?

## 2021-08-21 ENCOUNTER — Other Ambulatory Visit: Payer: Federal, State, Local not specified - PPO

## 2021-08-21 DIAGNOSIS — K50019 Crohn's disease of small intestine with unspecified complications: Secondary | ICD-10-CM | POA: Diagnosis not present

## 2021-08-21 DIAGNOSIS — R1031 Right lower quadrant pain: Secondary | ICD-10-CM

## 2021-08-25 LAB — CALPROTECTIN, FECAL: Calprotectin, Fecal: 45 ug/g (ref 0–120)

## 2021-08-28 ENCOUNTER — Other Ambulatory Visit: Payer: Self-pay | Admitting: Adult Health Nurse Practitioner

## 2021-08-28 DIAGNOSIS — I10 Essential (primary) hypertension: Secondary | ICD-10-CM

## 2021-09-03 ENCOUNTER — Encounter: Payer: Self-pay | Admitting: Internal Medicine

## 2021-09-10 ENCOUNTER — Encounter: Payer: Self-pay | Admitting: Internal Medicine

## 2021-09-10 ENCOUNTER — Ambulatory Visit (AMBULATORY_SURGERY_CENTER): Payer: Federal, State, Local not specified - PPO | Admitting: Internal Medicine

## 2021-09-10 VITALS — BP 124/67 | HR 70 | Temp 98.6°F | Resp 18 | Ht 70.0 in | Wt 239.0 lb

## 2021-09-10 DIAGNOSIS — Z86010 Personal history of colonic polyps: Secondary | ICD-10-CM

## 2021-09-10 DIAGNOSIS — K621 Rectal polyp: Secondary | ICD-10-CM | POA: Diagnosis not present

## 2021-09-10 DIAGNOSIS — K50019 Crohn's disease of small intestine with unspecified complications: Secondary | ICD-10-CM | POA: Diagnosis not present

## 2021-09-10 DIAGNOSIS — K5000 Crohn's disease of small intestine without complications: Secondary | ICD-10-CM | POA: Diagnosis not present

## 2021-09-10 DIAGNOSIS — D128 Benign neoplasm of rectum: Secondary | ICD-10-CM

## 2021-09-10 MED ORDER — SODIUM CHLORIDE 0.9 % IV SOLN: 500.0000 mL | Freq: Once | INTRAVENOUS | Status: DC

## 2021-09-10 MED ORDER — PREDNISONE 20 MG PO TABS: ORAL_TABLET | ORAL | 0 refills | Status: AC

## 2021-09-10 NOTE — Progress Notes (Signed)
Pt's states no medical or surgical changes since previsit or office visit. 

## 2021-09-10 NOTE — Op Note (Signed)
[place] ?Patient Name: Cole [name] ?Procedure Date: 09/10/2021 1:18 PM ?MRN: [id number] ?Endoscopist: Docia Chuck. [name] , MD ?Age: 56 ?Referring MD:  ?Date of Birth: 05/03/1966 ?Gender: Male ?Account #: [id number] ?Procedure:                Colonoscopy with biopsy; cold snare polypectomy x 1 ?Indications:              High risk colon cancer surveillance: Crohn's  ?                          colitis of 8 (or more) years duration. Status post  ?                          ileocecectomy 2011 elsewhere. Last colonoscopy  ?                          January 2019 with ileal disease ?Medicines:                Monitored Anesthesia Care ?Procedure:                Pre-Anesthesia Assessment: ?                          - Prior to the procedure, a History and Physical  ?                          was performed, and patient medications and  ?                          allergies were reviewed. The patient's tolerance of  ?                          previous anesthesia was also reviewed. The risks  ?                          and benefits of the procedure and the sedation  ?                          options and risks were discussed with the patient.  ?                          All questions were answered, and informed consent  ?                          was obtained. Prior Anticoagulants: The patient has  ?                          taken no previous anticoagulant or antiplatelet  ?                          agents. ASA Grade Assessment: II - A patient with  ?                          mild systemic disease. After reviewing the risks  ?  and benefits, the patient was deemed in  ?                          satisfactory condition to undergo the procedure. ?                          After obtaining informed consent, the colonoscope  ?                          was passed under direct vision. Throughout the  ?                          procedure, the patient's blood pressure, pulse, and  ?                           oxygen saturations were monitored continuously. The  ?                          0405 PCF-H190TL Slim SB Colonoscope was introduced  ?                          through the anus and advanced to the the  ?                          ileocolonic anastomosis. The terminal ileum and the  ?                          rectum were photographed. The quality of the bowel  ?                          preparation was excellent. The colonoscopy was  ?                          performed without difficulty. The patient tolerated  ?                          the procedure well. The bowel preparation used was  ?                          SUPREP via split dose instruction. ?Scope In: 1:23:30 PM ?Scope Out: 1:38:28 PM ?Scope Withdrawal Time: 0 hours 12 minutes 42 seconds  ?Total Procedure Duration: 0 hours 14 minutes 58 seconds  ?Findings:                 The the colonic mucosa was grossly normal  ?                          throughout (save small polyp described below)  ?                          without evidence of inflammatory bowel disease.  ?                          There was evidence of prior ileocecectomy. The most  ?  proximal colonic mucosa near the anastomosis was  ?                          somewhat ulcerated. Anastomosis was mildly narrowed  ?                          but permitted the passage of the ultraslim  ?                          pediatric scope without resistance. ?                          The neo-terminal ileum contained multiple ulcers  ?                          for at least 15 cm. This is consistent with Crohn's  ?                          disease. No significant stenosis. See images. No  ?                          bleeding was present. Biopsies were taken with a  ?                          cold forceps for histology. ?                          A 3 mm polyp was found in the rectum. The polyp was  ?                          sessile. The polyp was removed with a cold snare.  ?                           Resection and retrieval were complete. ?Complications:            No immediate complications. Estimated blood loss:  ?                          None. ?Estimated Blood Loss:     Estimated blood loss: none. ?Impression:               1. Significant active ileal Crohn's disease ?                          2. Status post ileocecectomy remotely ?                          3. Diminutive rectal polyp removed ?                          4. Otherwise normal exam. ?Recommendation:           - Repeat colonoscopy in 5 years for surveillance. ?                          - Patient has a  contact number available for  ?                          emergencies. The signs and symptoms of potential  ?                          delayed complications were discussed with the  ?                          patient. Return to normal activities tomorrow.  ?                          Written discharge instructions were provided to the  ?                          patient. ?                          - Resume previous diet. ?                          - Continue present medications. ?                          - Await pathology results. ?                          ???Prescribe prednisone 20 mg; #60; take 40 mg daily  ?                          for 2 weeks then 30 mg daily for 2 weeks then 20 mg  ?                          daily for 1 week then stop ?                          ???Office follow-up with Dr. [name] in 4-6 weeks ?Docia Chuck. [name], MD ?09/10/2021 1:52:02 PM ?This report has been signed electronically. ?

## 2021-09-10 NOTE — Progress Notes (Signed)
08/19/2021 ?Cole [name] ?[id number] ?07/01/1965 ?  ?  ?HISTORY OF PRESENT ILLNESS: This is a 56 year old male with ileal Crohn's disease status post ileocecectomy in [place], [place] in August 2011 when he presented with suppurative appendicitis with associated phlegmon.  The pathology revealed changes consistent with Crohn's disease.  He was seen by Dr. [name] 01/09/2014 for chronic diarrhea and the need for colonoscopy.  Colonoscopy was performed 02/20/2014.  The anastomosis was stenotic and ulcerated.  Biopsies were consistent with inflammatory bowel disease.  Subsequent small bowel follow-through revealed changes consistent with Crohn's disease involving the terminal ileum, 5 cm segment.  Subsequent CT scan revealed some inflammation at the level of anastomosis.  He has had suboptimal follow-up.  August 2017 he opted for surveillance.  Subsequently seen December 2018 by one of our nurse practitioners regarding iron deficiency anemia with hemoglobin 9.7 g.  Upper endoscopy performed 05/07/2017 that was normal except for incidental esophageal ring.  Then underwent complete colonoscopy 06/08/2017 was found to have stenosis and ulceration at the level of the anastomosis as previous.  No ileal intubation was performed.  He was placed on iron supplementation.  He responded well to that.  He was then last seen by Dr. [name] on 10/19/2017 at which time he once again continued to request surveillance for his Crohn's disease. ?  ?Now he is back here today for recent ER follow-up.  He tells me that he had sudden onset of right-sided abdominal pain and went to the emergency department on 08/07/2021 at which time he had a CT scan of the abdomen and pelvis with contrast that showed the following: ?  ?  ?IMPRESSION: ?Active inflammatory Crohn's disease involving the neoterminal ileum. ?  ?Status post ileocecal resection with appendectomy. ?  ?Additional stable ancillary findings as above. ?  ?They gave him a course of  prednisone/steroids and some antibiotics, both of which he has completed.  He tells me that his pain has resolved.  He does, however, report chronic diarrhea that is described as explosive in nature, at least 6 bowel movements a day.  Really never any formed stools.  He denies seeing blood.  He tells me that he has been using an oregano oil extract mixed with olive oil and rubbing it on his abdomen for his Crohn's disease.  He says that he saw somebody who did it and cured his Crohn's disease that way. ?  ?    ?Past Medical History:  ?Diagnosis Date  ? ADD (attention deficit disorder)    ? Allergy    ? Appendicitis    ? Arthritis    ? ASHD (arteriosclerotic heart disease)    ? Cancer [facility])    ? Colitis    ? Crohn's disease ([place])    ? Eczema    ? GERD (gastroesophageal reflux disease)    ? Hyperlipidemia    ? Hypertension    ? Left renal mass    ? Morbid obesity ([place])    ? Myocardial infarction [facility])    ?  status post RCA stenting in [facility] 2011  ? Positive for macroalbuminuria    ? Renal cell carcinoma, left ([place]) 08/22/2019  ?  Dr. Risa Grill for left renal mass found during crohn's evaluation, had left partial nephrectomy 01/17/2015 and had + renal cell carcinoma   ?  ?     ?Past Surgical History:  ?Procedure Laterality Date  ? APPENDECTOMY      ? COLON SURGERY      ?  CORONARY STENT PLACEMENT      ?  x 1  ? ELBOW SURGERY      ? ROBOTIC ASSITED PARTIAL NEPHRECTOMY Left 01/17/2015  ?  Procedure: ROBOTIC ASSITED LAPAROSCOPIC LEFT  PARTIAL  NEPHRECTOMY;  Surgeon: Ardis Hughs, MD;  Location: [facility];  Service: Urology;  Laterality: Left;  ?  ? reports that he has been smoking cigarettes. He has a 30.00 pack-year smoking history. He has never used smokeless tobacco. He reports current alcohol use. He reports current drug use. Drug: Marijuana. ?family history includes Crohn's disease in his paternal aunt and paternal uncle; Diabetes in his maternal grandmother. ?No Known Allergies ?  ?  ?    ?Outpatient Encounter  Medications as of 08/19/2021  ?Medication Sig  ? albuterol (VENTOLIN HFA) 108 (90 Base) MCG/ACT inhaler Inhale 1-2 puffs into the lungs every 4 (four) hours as needed for wheezing or shortness of breath.  ? amphetamine-dextroamphetamine (ADDERALL) 20 MG tablet Take 1 tablet (20 mg total) by mouth 2 (two) times daily.  ? aspirin 81 MG chewable tablet Chew by mouth.  ? atorvastatin (LIPITOR) 80 MG tablet Take  1 tablet  Daily  for Cholesterol  ? ferrous sulfate 325 (65 FE) MG tablet Take 1 tablet (325 mg total) by mouth 2 (two) times daily. Patient needs office visit for further refills  ? metoprolol tartrate (LOPRESSOR) 25 MG tablet Take 1 tablet (25 mg total) by mouth 2 (two) times daily.  ? Multiple Vitamins-Minerals (MULTIVITAMIN WITH MINERALS) tablet Take 1 tablet by mouth daily.  ? ondansetron (ZOFRAN-ODT) 4 MG disintegrating tablet Take 1 tablet (4 mg total) by mouth every 8 (eight) hours as needed for nausea or vomiting.  ? Spacer/Aero-Holding Chambers (AEROCHAMBER PLUS) inhaler Use with inhaler  ? [DISCONTINUED] fluticasone (FLONASE) 50 MCG/ACT nasal spray Place 2 sprays into both nostrils daily.  ?  ?No facility-administered encounter medications on file as of 08/19/2021.  ?  ?  ?  ?REVIEW OF SYSTEMS  : All other systems reviewed and negative except where noted in the History of Present Illness. ?  ?  ?PHYSICAL EXAM: ?Pulse 66   Ht 5' 10"  (1.778 m)   Wt 239 lb (108.4 kg)   SpO2 95%   BMI 34.29 kg/m?  ?General: Well developed white male in no acute distress ?Head: Normocephalic and atraumatic ?Eyes:  Sclerae anicteric, conjunctiva pink. ?Ears: Normal auditory acuity ?Lungs: Clear throughout to auscultation; no W/R/R. ?Heart: Regular rate and rhythm; no M/R/G. ?Abdomen: Soft, non-distended.  BS present, somewhat hyperactive.  Non-tender. ?Musculoskeletal: Symmetrical with no gross deformities  ?Skin: No lesions on visible extremities ?Extremities: No edema  ?Neurological: Alert oriented x 4, grossly  non-focal ?Psychological:  Alert and cooperative. Normal mood and affect ?  ?ASSESSMENT AND PLAN: ?#1. Ileal Crohn's status post ileocecectomy elsewhere 2011. Noted in short segment of active disease on prior postsurgical workup. Patient opted for ongoing active surveillance despite the fact that he had iron deficiency anemia related to his disease.  Now recently had rather sudden onset severe right lower quadrant abdominal pain.  CT scan showed active inflammatory Crohn's disease involving the neoterminal ileum.  Was treated with a course of steroids and antibiotics.  Pain now improved and actually resolved.  Chronically has loose stool/diarrhea.  He seems very reluctant to want to go on medication/treatment for his Crohn's.  We will check iron studies, sed rate, CRP, and fecal calprotectin.  We will plan for colonoscopy with Dr. [name] since last visit 2019.  Further  discussions can be had pending those results. ?#2. Iron deficiency anemia related to ileal Crohn's and anastomotic ulceration. Good response to iron previously.  As above we will recheck iron levels.  Hemoglobin recently normal. ?  ?**The risks, benefits, and alternatives to colonoscopy were discussed with the patient and he consents to proceed.  ?

## 2021-09-10 NOTE — Progress Notes (Signed)
Called to room to assist during endoscopic procedure.  Patient ID and intended procedure confirmed with present staff. Received instructions for my participation in the procedure from the performing physician.  

## 2021-09-10 NOTE — Progress Notes (Signed)
Report to PACU, RN, vss, BBS= Clear.  

## 2021-09-10 NOTE — Patient Instructions (Signed)

## 2021-09-12 ENCOUNTER — Telehealth: Payer: Self-pay

## 2021-09-12 NOTE — Telephone Encounter (Signed)
?  Follow up Call- ? ? ?  09/10/2021  ? 12:41 PM  ?Call back number  ?Post procedure Call Back phone  # [phone]  ?Permission to leave phone message Yes  ?  ? ?Patient questions: ? ?Do you have a fever, pain , or abdominal swelling? No. ?Pain Score  0 * ? ?Have you tolerated food without any problems? Yes.   ? ?Have you been able to return to your normal activities? Yes.   ? ?Do you have any questions about your discharge instructions: ?Diet   No. ?Medications  No. ?Follow up visit  No. ? ?Do you have questions or concerns about your Care? No. ? ?Actions: ?* If pain score is 4 or above: ?No action needed, pain <4. ? ? ?

## 2021-09-13 ENCOUNTER — Encounter: Payer: Self-pay | Admitting: Internal Medicine

## 2021-10-09 DIAGNOSIS — S30861A Insect bite (nonvenomous) of abdominal wall, initial encounter: Secondary | ICD-10-CM | POA: Diagnosis not present

## 2021-10-09 DIAGNOSIS — W57XXXA Bitten or stung by nonvenomous insect and other nonvenomous arthropods, initial encounter: Secondary | ICD-10-CM | POA: Diagnosis not present

## 2021-11-05 ENCOUNTER — Ambulatory Visit: Payer: Federal, State, Local not specified - PPO | Admitting: Nurse Practitioner

## 2021-11-05 ENCOUNTER — Encounter: Payer: Self-pay | Admitting: Nurse Practitioner

## 2021-11-05 VITALS — BP 144/97 | HR 77 | Temp 97.1°F | Wt 231.2 lb

## 2021-11-05 DIAGNOSIS — J439 Emphysema, unspecified: Secondary | ICD-10-CM

## 2021-11-05 DIAGNOSIS — Z79899 Other long term (current) drug therapy: Secondary | ICD-10-CM

## 2021-11-05 DIAGNOSIS — G4452 New daily persistent headache (NDPH): Secondary | ICD-10-CM

## 2021-11-05 DIAGNOSIS — R7309 Other abnormal glucose: Secondary | ICD-10-CM | POA: Diagnosis not present

## 2021-11-05 DIAGNOSIS — R5383 Other fatigue: Secondary | ICD-10-CM | POA: Diagnosis not present

## 2021-11-05 DIAGNOSIS — E785 Hyperlipidemia, unspecified: Secondary | ICD-10-CM

## 2021-11-05 DIAGNOSIS — E559 Vitamin D deficiency, unspecified: Secondary | ICD-10-CM

## 2021-11-05 DIAGNOSIS — I700 Atherosclerosis of aorta: Secondary | ICD-10-CM | POA: Diagnosis not present

## 2021-11-05 DIAGNOSIS — F988 Other specified behavioral and emotional disorders with onset usually occurring in childhood and adolescence: Secondary | ICD-10-CM

## 2021-11-05 DIAGNOSIS — I10 Essential (primary) hypertension: Secondary | ICD-10-CM

## 2021-11-05 DIAGNOSIS — R61 Generalized hyperhidrosis: Secondary | ICD-10-CM

## 2021-11-05 DIAGNOSIS — E538 Deficiency of other specified B group vitamins: Secondary | ICD-10-CM

## 2021-11-05 DIAGNOSIS — K50019 Crohn's disease of small intestine with unspecified complications: Secondary | ICD-10-CM

## 2021-11-05 DIAGNOSIS — R110 Nausea: Secondary | ICD-10-CM

## 2021-11-05 DIAGNOSIS — E669 Obesity, unspecified: Secondary | ICD-10-CM

## 2021-11-05 DIAGNOSIS — F17200 Nicotine dependence, unspecified, uncomplicated: Secondary | ICD-10-CM

## 2021-11-05 DIAGNOSIS — D649 Anemia, unspecified: Secondary | ICD-10-CM

## 2021-11-05 LAB — POC COVID19 BINAXNOW: SARS Coronavirus 2 Ag: NEGATIVE

## 2021-11-05 NOTE — Progress Notes (Signed)
FOLLOW UP 6 MONTH  Assessment and Plan:   1. Primary hypertension Controlled, elevated in clinic  Continue medications; Metoprolol as directed BID Discussed DASH (Dietary Approaches to Stop Hypertension) DASH diet is lower in sodium than a typical American diet. Cut back on foods that are high in saturated fat, cholesterol, and trans fats. Eat more whole-grain foods, fish, poultry, and nuts Remain active and exercise as tolerated daily.  Monitor BP at home-Call if greater than 130/80.   - CBC with Differential/Platelet - COMPLETE METABOLIC PANEL WITH GFR - EKG 12-Lead  2. Pulmonary emphysema, unspecified emphysema type ([place]) Continue Albuterol Start Breztri (sample provided) Continue to monitor.  Smoking cessation discussed. Not ready to quit.   3. Atherosclerosis of aorta (HCC) Continue bASA Continue to monitor  - Lipid panel  4. Hyperlipidemia, unspecified hyperlipidemia type Controlled Continue Atorvastatin Discussed lifestyle modifications. Recommended diet heavy in fruits and veggies, omega 3's. Decrease consumption of animal meats, cheeses, and dairy products. Remain active and exercise as tolerated. Continue to monitor.  - Lipid panel  5. Crohn's disease of small intestine with complication (HCC) Stopped 6 week steroid taper x1 week ago. Continue ferrous sulfate Continue to follow with GI.  6. Attention deficit disorder, unspecified hyperactivity presence Controlled Continue medications as directed.  7. Vitamin D deficiency Continue multivitamin. Continue to monitor  - VITAMIN D 25 Hydroxy (Vit-D Deficiency, Fractures)  8. Vitamin B 12 deficiency  - Vitamin B12  9. Smoker Discussed smoking cessation. Patient not ready to quit. Added Breztri inhaler  - EKG 12-Lead  10. Low hemoglobin Continue Ferrous Sulfate Sample Iron Fusion provided Sample AccurFur provided  - Iron, TIBC and Ferritin Panel  11. Obesity (BMI 30.0-34.9) Discussed  appropriate BMI Goal of losing 1 lb per month. Diet modification. Physical activity. Encouraged/praised to build confidence.  - TSH  12. Medication management All medications discussed and reviewed in full. All questions and concerns regarding medications addressed.     13. Diaphoresis Likely contributed to discontinuation of steroids. Continue to monitor  - TSH - POC COVID-19  14. Nausea  - POC COVID-19  15. Other fatigue  - POC COVID-19  16. Abnormal glucose  - Hemoglobin A1c  Cole. New daily persistent headache  - POC COVID-19   Further disposition pending results if labs check today. Discussed med's effects and SE's.   Over 30 minutes of face to face interview, exam, counseling, chart review, and critical decision making was performed.     Future Appointments  Date Time Provider [place]  02/03/2022  9:00 AM Alycia Rossetti, NP [facility] None    HPI 56 y.o. smoking Cole [name]  3 month follow up with hypertension, hyperlipidemia, prediabetes, CAD s/p stent  and vitamin D.  For approximately the last 2-3 weeks he has noticed an increase in HA pain accompanied by diaphoresis and fatigue, frequent cough.  He has been wearking glasses daily.  Cold sweaty yesterday, clammy.  He had a  recent Chron's flare, seeing Dr. [name] took 6 weeks of prednisone stopped last Tuesday (x1 week ago) symptoms have gotten worse since.  He has also noticed increase in BP and HR in the home.    His blood pressure has been controlled at home, today their BP is BP: (!) 144/97 He is on Metoprolol BID and takes sporadically, at least QD but not BID. 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  He denies chest pain, shortness of breath, dizziness.    He continues to smoke. Has tried chantix without any help in the past.  He  does use albuterol 1-2 x a week, has breakthrough cough with wheezing.  He is not on a maintain inhaler.    BMI is Body mass index is 33.Cole kg/m., he is working on diet and exercise. Not exercising as much . He is not focusing as much on diet.  Has been eating too much red meat and sweet tea.  Wt Readings from Last 3 Encounters:  11/05/21 231 lb 3.2 oz (104.9 kg)  09/10/21 239 lb (108.4 kg)  08/19/21 239 lb (108.4 kg)    He previously followed with Dr. Risa Grill for left renal mass found during crohn's evaluation, had left partial nephrectomy 01/17/2015 and had + renal cell carcinoma.  Has not had recent appointment.  He has a history of ASHD S/P Stenting in 2010.  He is on bASA.  Denies dyspnea, exertional chest pressure/discomfort and irregular heart beat.   He is on cholesterol medication, lipitor 95m and denies myalgias. His cholesterol is at goal. The cholesterol last visit was:   Lab Results  Component Value Date   CHOL 177 07/08/2021   HDL 40 07/08/2021   LDLCALC 115 (H) 07/08/2021   TRIG 111 07/08/2021   CHOLHDL 4.4 07/08/2021    He has been working on diet and exercise for prediabetes, and denies paresthesia of the feet, polydipsia, polyuria and visual disturbances. Last A1C in the office was:  Lab Results  Component Value Date   HGBA1C 5.4 01/07/2021   Current Medications:     Current Outpatient Medications (Cardiovascular):    atorvastatin (LIPITOR) 80 MG tablet, Take  1 tablet  Daily  for Cholesterol   metoprolol tartrate (LOPRESSOR) 25 MG tablet, TAKE ONE TABLET BY MOUTH TWICE A DAY  Current Outpatient Medications (Respiratory):    albuterol (VENTOLIN HFA) 108 (90 Base) MCG/ACT inhaler, Inhale 1-2 puffs into the lungs every 4 (four) hours as needed for wheezing or shortness of breath.  Current Outpatient Medications (Analgesics):    aspirin 81 MG chewable tablet, Chew by mouth.  Current Outpatient Medications (Hematological):    ferrous sulfate 325 (65 FE) MG  tablet, Take 1 tablet (325 mg total) by mouth 2 (two) times daily. Patient needs office visit for further refills  Current Outpatient Medications (Other):    amphetamine-dextroamphetamine (ADDERALL) 20 MG tablet, Take 1 tablet (20 mg total) by mouth 2 (two) times daily.   Multiple Vitamins-Minerals (MULTIVITAMIN WITH MINERALS) tablet, Take 1 tablet by mouth daily.   ondansetron (ZOFRAN-ODT) 4 MG disintegrating tablet, Take 1 tablet (4 mg total) by mouth every 8 (eight) hours as needed for nausea or vomiting.  Medical History:  Past Medical History:  Diagnosis Date   ADD (attention deficit disorder)    Allergy    Appendicitis    Arthritis    ASHD (arteriosclerotic heart disease)    Cancer (HCC)    Colitis    [place] ([place]    Eczema    Emphysema of lung ([facility]    GERD (gastroesophageal reflux disease)    Hyperlipidemia    Hypertension    Left renal mass    Morbid obesity ([facility]    Myocardial infarction ([facility]    status post RCA stenting in [place] 2011   Positive for macroalbuminuria    Renal cell carcinoma, [place] ([place]  08/22/2019   Dr. Risa Grill for left renal mass found during crohn's evaluation, had left partial nephrectomy 01/17/2015 and had + renal cell carcinoma    Allergies No Known Allergies  SURGICAL HISTORY He  has a past surgical history that includes Appendectomy; Coronary stent placement; Colon surgery; Robotic assited partial nephrectomy (Left, 01/17/2015); and Elbow surgery. FAMILY HISTORY His family history includes Crohn's disease in his paternal aunt and paternal uncle; Diabetes in his maternal grandmother. SOCIAL HISTORY He  reports that he has been smoking cigarettes. He has a 30.00 pack-year smoking history. He has never used smokeless tobacco. He reports current alcohol use. He reports current drug use. Drug: Marijuana.   Immunization History  Administered Date(s) Administered   Td 05/19/2009   Tdap 12/05/2019   Health Maintenance  Topic Date Due    COVID-19 Vaccine (1) Never done   HIV Screening  Never done   Hepatitis C Screening  Never done   Zoster Vaccines- Shingrix (1 of 2) Never done   COLONOSCOPY (Pts 45-69yr Insurance coverage will need to be confirmed)  09/11/2026   TETANUS/TDAP  12/04/2029   HPV VACCINES  Aged Out   INFLUENZA VACCINE  Discontinued    Review of Systems:  Review of Systems  Constitutional: Negative.  Negative for chills, fever and weight loss.  HENT: Negative.  Negative for congestion and hearing loss.   Eyes: Negative.  Negative for blurred vision and double vision.  Respiratory:  Positive for cough and wheezing. Negative for hemoptysis, sputum production and shortness of breath.   Cardiovascular: Negative.  Negative for chest pain, palpitations, orthopnea and leg swelling.  Gastrointestinal:  Positive for nausea. Negative for abdominal pain, constipation, diarrhea, heartburn and vomiting.  Genitourinary: Negative.   Musculoskeletal: Negative.  Negative for falls, joint pain and myalgias.  Skin:  Negative for rash.  Neurological: Negative.  Negative for dizziness, tingling, tremors, loss of consciousness and headaches.       Fatigue  Endo/Heme/Allergies: Negative.        Diaphoresis  Psychiatric/Behavioral: Negative.  Negative for depression, memory loss and suicidal ideas.    Physical Exam: BP (!) 144/97   Pulse 77   Temp (!) 97.1 F (36.2 C)   Wt 231 lb 3.2 oz (104.9 kg)   SpO2 97%   BMI 33.Cole kg/m  Wt Readings from Last 3 Encounters:  11/05/21 231 lb 3.2 oz (104.9 kg)  09/10/21 239 lb (108.4 kg)  08/19/21 239 lb (108.4 kg)   General Appearance: Well nourished, in no apparent distress. Eyes: PERRLA, EOMs, conjunctiva no swelling or erythema Sinuses: No Frontal/maxillary tenderness ENT/Mouth: Ext aud canals clear, TMs without erythema, bulging. No erythema, swelling, or exudate on post pharynx.  Tonsils not swollen or erythematous. Hearing normal.  Neck: Supple, thyroid normal.   Respiratory: Scattered wheezing throughout all lung fields upon inspiration and expiration. Respiratory effort normal. Cardio: RRR with no MRGs. Brisk peripheral pulses without edema.  Abdomen: Soft, + BS, obese,  Non tender, no guarding, rebound, hernias, masses. Lymphatics: Non tender without lymphadenopathy.  Musculoskeletal: Full ROM, 5/5 strength, Normal Skin: Several healed scars along AB. Warm, dry without rashes, lesions, ecchymosis.  Neuro: Cranial nerves intact. Normal muscle tone except some minor muscle wasting lateral right hand with decrease extension of 3 lateral fingers, normal distal vascular, no cerebellar symptoms. Psych: Awake and oriented X 3, normal affect, Insight and Judgment appropriate.   EKG:  NSR  Covid Negative  TDarrol Jump NP [facility]  11/05/2021

## 2021-11-05 NOTE — Patient Instructions (Signed)

## 2021-11-06 LAB — COMPLETE METABOLIC PANEL WITH GFR
AG Ratio: 1.4 (calc) (ref 1.0–2.5)
ALT: 33 U/L (ref 9–46)
AST: 21 U/L (ref 10–35)
Albumin: 4.2 g/dL (ref 3.6–5.1)
Alkaline phosphatase (APISO): 87 U/L (ref 35–144)
BUN: 15 mg/dL (ref 7–25)
CO2: 23 mmol/L (ref 20–32)
Calcium: 9.3 mg/dL (ref 8.6–10.3)
Chloride: 105 mmol/L (ref 98–110)
Creat: 0.98 mg/dL (ref 0.70–1.30)
Globulin: 3.1 g/dL (calc) (ref 1.9–3.7)
Glucose, Bld: 66 mg/dL (ref 65–99)
Potassium: 4.4 mmol/L (ref 3.5–5.3)
Sodium: 138 mmol/L (ref 135–146)
Total Bilirubin: 0.4 mg/dL (ref 0.2–1.2)
Total Protein: 7.3 g/dL (ref 6.1–8.1)
eGFR: 90 mL/min/{1.73_m2} (ref >=60)

## 2021-11-06 LAB — HEMOGLOBIN A1C
Hgb A1c MFr Bld: 5.4 % of total Hgb (ref <5.7)
Mean Plasma Glucose: 108 mg/dL
eAG (mmol/L): 6 mmol/L

## 2021-11-06 LAB — VITAMIN D 25 HYDROXY (VIT D DEFICIENCY, FRACTURES): Vit D, 25-Hydroxy: 30 ng/mL (ref 30–100)

## 2021-11-06 LAB — LIPID PANEL
Cholesterol: 207 mg/dL — ABNORMAL HIGH (ref <200)
HDL: 47 mg/dL (ref >=40)
LDL Cholesterol (Calc): 128 mg/dL (calc) — ABNORMAL HIGH
Non-HDL Cholesterol (Calc): 160 mg/dL (calc) — ABNORMAL HIGH (ref <130)
Total CHOL/HDL Ratio: 4.4 (calc) (ref <5.0)
Triglycerides: 183 mg/dL — ABNORMAL HIGH (ref <150)

## 2021-11-06 LAB — IRON,TIBC AND FERRITIN PANEL
%SAT: 16 % (calc) — ABNORMAL LOW (ref 20–48)
Ferritin: 216 ng/mL (ref 38–380)
Iron: 57 ug/dL (ref 50–180)
TIBC: 362 mcg/dL (calc) (ref 250–425)

## 2021-11-06 LAB — CBC WITH DIFFERENTIAL/PLATELET
Absolute Monocytes: 740 cells/uL (ref 200–950)
Basophils Absolute: 51 cells/uL (ref 0–200)
Basophils Relative: 0.6 %
Eosinophils Absolute: 145 cells/uL (ref 15–500)
Eosinophils Relative: 1.7 %
HCT: 45.7 % (ref 38.5–50.0)
Hemoglobin: 15.5 g/dL (ref 13.2–17.1)
Lymphs Abs: 1182 cells/uL (ref 850–3900)
MCH: 31.1 pg (ref 27.0–33.0)
MCHC: 33.9 g/dL (ref 32.0–36.0)
MCV: 91.8 fL (ref 80.0–100.0)
MPV: 10.3 fL (ref 7.5–12.5)
Monocytes Relative: 8.7 %
Neutro Abs: 6384 cells/uL (ref 1500–7800)
Neutrophils Relative %: 75.1 %
Platelets: 287 10*3/uL (ref 140–400)
RBC: 4.98 10*6/uL (ref 4.20–5.80)
RDW: 14.4 % (ref 11.0–15.0)
Total Lymphocyte: 13.9 %
WBC: 8.5 10*3/uL (ref 3.8–10.8)

## 2021-11-06 LAB — VITAMIN B12: Vitamin B-12: 506 pg/mL (ref 200–1100)

## 2021-11-06 LAB — TSH: TSH: 2.13 mIU/L (ref 0.40–4.50)

## 2021-11-11 ENCOUNTER — Encounter: Payer: Self-pay | Admitting: Nurse Practitioner

## 2021-11-11 DIAGNOSIS — F17200 Nicotine dependence, unspecified, uncomplicated: Secondary | ICD-10-CM

## 2021-11-13 MED ORDER — ALBUTEROL SULFATE HFA 108 (90 BASE) MCG/ACT IN AERS: 1.0000 | INHALATION_SPRAY | RESPIRATORY_TRACT | 2 refills | Status: DC | PRN

## 2021-12-06 ENCOUNTER — Other Ambulatory Visit: Payer: Self-pay | Admitting: Nurse Practitioner

## 2021-12-06 MED ORDER — BREZTRI AEROSPHERE 160-9-4.8 MCG/ACT IN AERO: 2.0000 | INHALATION_SPRAY | Freq: Two times a day (BID) | RESPIRATORY_TRACT | 3 refills | Status: DC

## 2021-12-10 ENCOUNTER — Encounter: Payer: Self-pay | Admitting: Nurse Practitioner

## 2021-12-10 ENCOUNTER — Ambulatory Visit (INDEPENDENT_AMBULATORY_CARE_PROVIDER_SITE_OTHER): Payer: Federal, State, Local not specified - PPO | Admitting: Nurse Practitioner

## 2021-12-10 VITALS — BP 124/86 | HR 70 | Temp 97.5°F | Ht 70.0 in | Wt 227.0 lb

## 2021-12-10 DIAGNOSIS — Z79899 Other long term (current) drug therapy: Secondary | ICD-10-CM

## 2021-12-10 DIAGNOSIS — I700 Atherosclerosis of aorta: Secondary | ICD-10-CM | POA: Diagnosis not present

## 2021-12-10 DIAGNOSIS — J439 Emphysema, unspecified: Secondary | ICD-10-CM

## 2021-12-10 DIAGNOSIS — E785 Hyperlipidemia, unspecified: Secondary | ICD-10-CM

## 2021-12-10 DIAGNOSIS — I10 Essential (primary) hypertension: Secondary | ICD-10-CM

## 2021-12-10 DIAGNOSIS — M79642 Pain in left hand: Secondary | ICD-10-CM

## 2021-12-10 DIAGNOSIS — M79641 Pain in right hand: Secondary | ICD-10-CM

## 2021-12-10 DIAGNOSIS — F17200 Nicotine dependence, unspecified, uncomplicated: Secondary | ICD-10-CM

## 2021-12-10 DIAGNOSIS — F988 Other specified behavioral and emotional disorders with onset usually occurring in childhood and adolescence: Secondary | ICD-10-CM

## 2021-12-10 MED ORDER — AMPHETAMINE-DEXTROAMPHETAMINE 20 MG PO TABS: 20.0000 mg | ORAL_TABLET | Freq: Two times a day (BID) | ORAL | 0 refills | Status: DC

## 2021-12-10 MED ORDER — DICLOFENAC SODIUM 1 % EX GEL: 4.0000 g | Freq: Four times a day (QID) | CUTANEOUS | 2 refills | Status: DC

## 2021-12-10 NOTE — Patient Instructions (Addendum)
Decrease Lipitor to 40 mg x3 Days Week.  Rheumatoid Arthritis Rheumatoid arthritis (RA) is a long-term (chronic) disease. RA causes inflammation in your joints. Your joints may feel painful, stiff, swollen, and warm. RA may start slowly. It most often affects the small joints of the hands and feet. It can also affect other parts of the body. Symptoms of RA often come and go. There is no cure for RA, but medicines can help your symptoms. What are the causes? RA is an autoimmune disease. This means that your body's defense system (immune system) attacks healthy parts of your body by mistake. The exact cause of RA is not known. What increases the risk? Being male. Having a family history of RA or other diseases like RA. Smoking. Being very overweight (obese). Being exposed to pollutants or chemicals. What are the signs or symptoms? Symptoms start slowly. They are often worse in the morning. The first symptom is often morning stiffness that lasts longer than 30 minutes. As RA gets worse, symptoms may include: Pain, stiffness, swelling, warmth, and tenderness in joints on both sides of your body. Loss of energy. Not wanting to eat as much as normal. Weight loss. A low fever. Dry eyes and a dry mouth. Firm lumps that grow under your skin. Changes in the way your joints look or the way they work. Symptoms vary and they often come and go. Symptoms sometimes get worse for a period of time. These are called flares. How is this treated? Treatment may include: Taking good care of yourself. Be sure to rest as needed, eat a healthy diet, and exercise. Medicines. These may include: Pain relievers. Medicines to help with inflammation. Disease-modifying antirheumatic drugs (DMARDs). Medicines called biologic response modifiers. Physical therapy and occupational therapy. Surgery, if joint damage is very bad. Your doctor will work with you to find the best treatments. Follow these instructions  at home: Managing pain, stiffness, and swelling If told, put heat on the affected area. Do this as often as told by your doctor. Use the heat source that your doctor recommends, such as a moist heat pack or a heating pad. Place a towel between your skin and the heat source. Leave the heat on for 20-30 minutes. Take off the heat if your skin turns bright red. This is very important. If you cannot feel pain, heat, or cold, you have a greater risk of getting burned.  Activity Return to your normal activities when your doctor says that it is safe. Rest when you have a flare. Exercise as told by your doctor. This can help your joints move better and get stronger. General instructions Take over-the-counter and prescription medicines only as told by your doctor. Keep all follow-up visits. Where to find more information [employer] of Rheumatology: rheumatology.[place]: arthritis.org Contact a doctor if: You have a flare. You have a fever. You have problems because of your medicines. Get help right away if: You have chest pain. You have trouble breathing. You get a hot, painful joint all of a sudden, and it is worse than your normal joint aches. These symptoms may be an emergency. Get help right away. Call 911. Do not wait to see if the symptoms will go away. Do not drive yourself to the hospital. Summary RA is a long-term disease. RA causes inflammation in your joints. Symptoms of RA start slowly. They are often worse in the morning. This information is not intended to replace advice given to you by your health care provider.  Make sure you discuss any questions you have with your health care provider. Document Revised: 03/07/2021 Document Reviewed: 03/07/2021 Elsevier Patient Education  Cole [name].

## 2021-12-10 NOTE — Progress Notes (Signed)
FOLLOW UP 1 MONTH  Assessment and Plan:   Primary hypertension Controlled, elevated in clinic  Continue medications; Metoprolol as directed BID Discussed DASH (Dietary Approaches to Stop Hypertension) DASH diet is lower in sodium than a typical American diet. Cut back on foods that are high in saturated fat, cholesterol, and trans fats. Eat more whole-grain foods, fish, poultry, and nuts Remain active and exercise as tolerated daily.  Monitor BP at home-Call if greater than 130/80.   Pulmonary emphysema, unspecified emphysema type ([place]) Continue Albuterol Continue Breztri  Continue to monitor.  Smoking cessation discussed. Not ready to quit.   Smoker Discussed smoking cessation. Patient not ready to quit. Continue inhalers. Continue to monitor   Atherosclerosis of aorta (HCC)/ Hyperlipidemia, unspecified hyperlipidemia type Change the way you take Lipitor Decrease to 40 mg at least 3 days a week. Discussed adding Zetia if Lipids remain elevated at [facility] 01/2022 Continue bASA Discussed lifestyle modifications. Recommended diet heavy in fruits and veggies, omega 3's. Decrease consumption of animal meats, cheeses, and dairy products. Remain active and exercise as tolerated. Continue to monitor.   Attention deficit disorder, unspecified hyperactivity presence Adult ADHD Self-Report Scale (ASRS-v1.1) reviewed. Discussed behavioral therapy. Exercise. Healthy diet. Sleep hygiene. Mindful meditation. Adderall refilled.  Pain in both hands Discussed work up for RA during next OV Apply topical Voltaren gel PRN  Continue to monitor  Medication management All medications discussed and reviewed in full. All questions and concerns regarding medications addressed.    Meds ordered this encounter  Medications   diclofenac Sodium (VOLTAREN) 1 % GEL    Sig: Apply 4 g topically 4 (four) times daily.    Dispense:  4 g    Refill:  2    Order Specific Question:   Supervising  Provider    Answer:   Unk Pinto [phone]   amphetamine-dextroamphetamine (ADDERALL) 20 MG tablet    Sig: Take 1 tablet (20 mg total) by mouth 2 (two) times daily.    Dispense:  60 tablet    Refill:  0    Order Specific Question:   Supervising Provider    Answer:   Unk Pinto [phone]    Further disposition pending results if labs check today. Discussed med's effects and SE's.   Over 30 minutes of face to face interview, exam, counseling, chart review, and critical decision making was performed.     Future Appointments  Date Time Provider [place]  12/31/2021  2:00 PM Irene Shipper, MD [facility]  02/03/2022  9:00 AM Alycia Rossetti, NP [facility] None    HPI 56 y.o. smoking whit male  3 month follow up with hypertension, hyperlipidemia, prediabetes, CAD s/p stent  and vitamin D.  Patient reports to clinic for one month follow up.  Reports no longer having symptoms of diaphoresis and fatigue, frequent cough.He has completed steroid taper for a recent chron's flare and feels as though symptoms were contributed to weaning off of medication.     He was also started on Breztri for COPD, cough.  Reports medication is effective.  He has not had to use his Albuterol inhaler. He is continuing to work on smoking cessation. He continues to smoke. Has tried chantix without any help in the past.  He shares with me today increase in bilateral hand pain. 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  Most notably at night.  He has been  using Aleve topical spay  with minimal benefit.  Mother has a hx of RA.  Patient has a hx of Chron's.     His blood pressure has been controlled at home, today their BP is BP: 124/86 He is on Metoprolol BID and takes sporadically, at least QD but not BID. BP Readings from Last 3 Encounters:  12/10/21 124/86  11/05/21 (!) 144/97  09/10/21 124/67   He denies chest pain, shortness of  breath, dizziness.    BMI is Body mass index is 32.57 kg/m., he is working on diet and exercise. Not exercising as much . He is not focusing as much on diet.  Has been eating too much red meat and sweet tea.  Wt Readings from Last 3 Encounters:  12/10/21 227 lb (103 kg)  11/05/21 231 lb 3.2 oz (104.9 kg)  09/10/21 239 lb (108.4 kg)    He has a history of ASHD S/P Stenting in 2010.  He is on bASA.  Denies dyspnea, exertional chest pressure/discomfort and irregular heart beat.   He is on cholesterol medication, lipitor 37m and reports increase in myalgias.  He has not been taking his cholesterol medication daily, only 1-2 times weekly. His cholesterol is not at goal. The cholesterol last visit was:   Lab Results  Component Value Date   CHOL 207 (H) 11/05/2021   HDL 47 11/05/2021   LDLCALC 128 (H) 11/05/2021   TRIG 183 (H) 11/05/2021   CHOLHDL 4.4 11/05/2021    Current Medications:     Current Outpatient Medications (Cardiovascular):    atorvastatin (LIPITOR) 80 MG tablet, Take  1 tablet  Daily  for Cholesterol   metoprolol tartrate (LOPRESSOR) 25 MG tablet, TAKE ONE TABLET BY MOUTH TWICE A DAY  Current Outpatient Medications (Respiratory):    albuterol (VENTOLIN HFA) 108 (90 Base) MCG/ACT inhaler, Inhale 1-2 puffs into the lungs every 4 (four) hours as needed for wheezing or shortness of breath.   Budeson-Glycopyrrol-Formoterol (BREZTRI AEROSPHERE) 160-9-4.8 MCG/ACT AERO, Inhale 2 puffs into the lungs in the morning and at bedtime.  Current Outpatient Medications (Analgesics):    aspirin 81 MG chewable tablet, Chew by mouth.  Current Outpatient Medications (Hematological):    ferrous sulfate 325 (65 FE) MG tablet, Take 1 tablet (325 mg total) by mouth 2 (two) times daily. Patient needs office visit for further refills  Current Outpatient Medications (Other):    amphetamine-dextroamphetamine (ADDERALL) 20 MG tablet, Take 1 tablet (20 mg total) by mouth 2 (two) times daily.    Multiple Vitamins-Minerals (MULTIVITAMIN WITH MINERALS) tablet, Take 1 tablet by mouth daily.   ondansetron (ZOFRAN-ODT) 4 MG disintegrating tablet, Take 1 tablet (4 mg total) by mouth every 8 (eight) hours as needed for nausea or vomiting.  Medical History:  Past Medical History:  Diagnosis Date   ADD (attention deficit disorder)    Allergy    Appendicitis    Arthritis    ASHD (arteriosclerotic heart disease)    Cancer (HCC)    Colitis    Cro[place]'s disease ([place]    Eczema    Emphysema of lung ([place]    GERD (gastroesophageal reflux disease)    Hyperlipidemia    Hypertension    Left renal mass    Morbid obesity ([place]    Myocardial infarction ([facility]    status post RCA stenting in [place] 2011   Positive for macroalbuminuria    Renal cell carcinoma, left ([place] 08/22/2019   Dr. GRisa Grillfor left renal mass found during crohn's evaluation, had left partial nephrectomy 01/17/2015  and had + renal cell carcinoma    Allergies No Known Allergies  SURGICAL HISTORY He  has a past surgical history that includes Appendectomy; Coronary stent placement; Colon surgery; Robotic assited partial nephrectomy (Left, 01/17/2015); and Elbow surgery. FAMILY HISTORY His family history includes Crohn's disease in his paternal aunt and paternal uncle; Diabetes in his maternal grandmother. SOCIAL HISTORY He  reports that he has been smoking cigarettes. He has a 30.00 pack-year smoking history. He has never used smokeless tobacco. He reports current alcohol use. He reports current drug use. Drug: Marijuana.   Immunization History  Administered Date(s) Administered   Td 05/19/2009   Tdap 12/05/2019   Health Maintenance  Topic Date Due   COVID-19 Vaccine (1) Never done   HIV Screening  Never done   Hepatitis C Screening  Never done   Zoster Vaccines- Shingrix (1 of 2) Never done   COLONOSCOPY (Pts 45-85yr Insurance coverage will need to be confirmed)  09/11/2026   TETANUS/TDAP  12/04/2029   HPV  VACCINES  Aged Out   INFLUENZA VACCINE  Discontinued    Review of Systems:  Review of Systems  Constitutional: Negative.  Negative for chills, fever and weight loss.  HENT: Negative.  Negative for congestion and hearing loss.   Eyes: Negative.  Negative for blurred vision and double vision.  Respiratory:  Negative for cough, hemoptysis, sputum production, shortness of breath and wheezing.   Cardiovascular: Negative.  Negative for chest pain, palpitations, orthopnea and leg swelling.  Gastrointestinal:  Negative for abdominal pain, constipation, diarrhea, heartburn, nausea and vomiting.  Genitourinary: Negative.   Musculoskeletal: Negative.  Negative for falls, joint pain and myalgias.  Skin:  Negative for rash.  Neurological: Negative.  Negative for dizziness, tingling, tremors, loss of consciousness and headaches.  Endo/Heme/Allergies: Negative.   Psychiatric/Behavioral: Negative.  Negative for depression, memory loss and suicidal ideas.    Physical Exam: BP 124/86   Pulse 70   Temp (!) 97.5 F (36.4 C)   Ht 5' 10"  (1.778 m)   Wt 227 lb (103 kg)   SpO2 97%   BMI 32.57 kg/m  Wt Readings from Last 3 Encounters:  12/10/21 227 lb (103 kg)  11/05/21 231 lb 3.2 oz (104.9 kg)  09/10/21 239 lb (108.4 kg)   General Appearance: Well nourished, in no apparent distress. Eyes: PERRLA, EOMs, conjunctiva no swelling or erythema Sinuses: No Frontal/maxillary tenderness ENT/Mouth: Ext aud canals clear, TMs without erythema, bulging. No erythema, swelling, or exudate on post pharynx.  Tonsils not swollen or erythematous. Hearing normal.  Neck: Supple, thyroid normal.  Respiratory:  CTA in all lung fields.   Cardio: RRR with no MRGs. Brisk peripheral pulses without edema.  Abdomen: Soft, + BS, obese,  Non tender, no guarding, rebound, hernias, masses. Lymphatics: Non tender without lymphadenopathy.  Musculoskeletal: Pain with movement and dexterity in BUE.  Tenderness to palpation.  No  edema, erythema noted. Skin: Several healed scars along AB. Warm, dry without rashes, lesions, ecchymosis.  Neuro: Cranial nerves intact. Normal muscle tone except some minor muscle wasting lateral right hand with decrease extension of 3 lateral fingers, normal distal vascular, no cerebellar symptoms. Psych: Awake and oriented X 3, normal affect, Insight and Judgment appropriate.    TDarrol Jump NP [facility]  12/10/2021

## 2021-12-31 ENCOUNTER — Ambulatory Visit: Payer: Federal, State, Local not specified - PPO | Admitting: Internal Medicine

## 2022-01-07 ENCOUNTER — Encounter: Payer: Federal, State, Local not specified - PPO | Admitting: Nurse Practitioner

## 2022-01-08 ENCOUNTER — Encounter: Payer: Federal, State, Local not specified - PPO | Admitting: Nurse Practitioner

## 2022-02-03 ENCOUNTER — Encounter: Payer: Federal, State, Local not specified - PPO | Admitting: Nurse Practitioner

## 2022-02-26 ENCOUNTER — Ambulatory Visit: Payer: Federal, State, Local not specified - PPO | Admitting: Internal Medicine

## 2022-03-12 ENCOUNTER — Ambulatory Visit
Admission: RE | Admit: 2022-03-12 | Discharge: 2022-03-12 | Disposition: A | Discharge status: Home / Self Care | Payer: Federal, State, Local not specified - PPO | Source: Ambulatory Visit | Attending: Emergency Medicine | Admitting: Emergency Medicine

## 2022-03-12 VITALS — BP 145/89 | HR 71 | Temp 98.2°F | Resp 18 | Ht 69.0 in | Wt 225.0 lb

## 2022-03-12 DIAGNOSIS — J441 Chronic obstructive pulmonary disease with (acute) exacerbation: Secondary | ICD-10-CM

## 2022-03-12 MED ORDER — PREDNISONE 10 MG PO TABS: 40.0000 mg | ORAL_TABLET | Freq: Every day | ORAL | 0 refills | Status: AC

## 2022-03-12 NOTE — ED Provider Notes (Signed)
Cole [name]    CSN: [id number] Arrival date & time: 03/12/22  1912      History   Chief Complaint Chief Complaint  Patient presents with   Nasal Congestion    Entered by patient    HPI Cole [name] is a 56 y.o. male.  Patient presents with 2-day history of congestion and cough.  He denies fever, chills, sore throat, shortness of breath, vomiting, diarrhea, or other symptoms.  Treatment at home with Advil and Mucinex.  He last used his albuterol inhaler yesterday.  His medical history includes emphysema, current everyday smoker, hypertension.  The history is provided by the patient and medical records.    Past Medical History:  Diagnosis Date   ADD (attention deficit disorder)    Allergy    Appendicitis    Arthritis    ASHD (arteriosclerotic heart disease)    Cancer (HCC)    Colitis    Crohn's disease ([place])    Eczema    Emphysema of lung ([place])    GERD (gastroesophageal reflux disease)    Hyperlipidemia    Hypertension    Left renal mass    Morbid obesity ([place])    Myocardial infarction ([place])    status post RCA stenting in [place] 2011   Positive for macroalbuminuria    Renal cell carcinoma, left ([place]) 08/22/2019   Dr. Risa Grill for left renal mass found during crohn's evaluation, had left partial nephrectomy 01/17/2015 and had + renal cell carcinoma     Patient Active Problem List   Diagnosis Date Noted   RLQ abdominal pain 08/20/2021   Emphysema of lung ([place]) 12/28/2019   Adrenal adenoma, left 12/28/2019   History of nephrolithiasis 12/05/2019   History of renal cell cancer 08/22/2019   Chronic iron deficiency anemia 04/06/2018   Morbid obesity ([place]) 04/06/2018   Atherosclerosis of aorta ([place]) 05/04/2017   Smoker 12/04/2014   Vitamin D deficiency 12/04/2014   Medication management 12/04/2014   Crohn's disease of small intestine with complication ([place]) 26/71/2458   Hyperlipidemia    Hypertension    ADD (attention deficit disorder)    ASHD  (arteriosclerotic heart disease)     Past Surgical History:  Procedure Laterality Date   APPENDECTOMY     COLON SURGERY     CORONARY STENT PLACEMENT     x 1   ELBOW SURGERY     ROBOTIC ASSITED PARTIAL NEPHRECTOMY Left 01/17/2015   Procedure: ROBOTIC ASSITED LAPAROSCOPIC LEFT  PARTIAL  NEPHRECTOMY;  Surgeon: Ardis Hughs, MD;  Location: [facility];  Service: Urology;  Laterality: Left;       Home Medications    Prior to Admission medications   Medication Sig Start Date End Date Taking? Authorizing Provider  predniSONE (DELTASONE) 10 MG tablet Take 4 tablets (40 mg total) by mouth daily for 5 days. 03/12/22 03/17/22 Yes Sharion Balloon, NP  albuterol (VENTOLIN HFA) 108 (90 Base) MCG/ACT inhaler Inhale 1-2 puffs into the lungs every 4 (four) hours as needed for wheezing or shortness of breath. 11/13/21   Cranford, Kenney Houseman, NP  amphetamine-dextroamphetamine (ADDERALL) 20 MG tablet Take 1 tablet (20 mg total) by mouth 2 (two) times daily. 12/10/21   Darrol Jump, NP  aspirin 81 MG chewable tablet Chew by mouth.    [provider]  atorvastatin (LIPITOR) 80 MG tablet Take  1 tablet  Daily  for Cholesterol 10/15/20   Unk Pinto, MD  Budeson-Glycopyrrol-Formoterol (BREZTRI AEROSPHERE) 160-9-4.8 MCG/ACT AERO Inhale 2 puffs into the lungs  in the morning and at bedtime. 12/06/21   Darrol Jump, NP  diclofenac Sodium (VOLTAREN) 1 % GEL Apply 4 g topically 4 (four) times daily. 12/10/21   Darrol Jump, NP  ferrous sulfate 325 (65 FE) MG tablet Take 1 tablet (325 mg total) by mouth 2 (two) times daily. Patient needs office visit for further refills 12/06/18   Irene Shipper, MD  metoprolol tartrate (LOPRESSOR) 25 MG tablet TAKE ONE TABLET BY MOUTH TWICE A DAY 08/28/21   Liane Comber, NP  Multiple Vitamins-Minerals (MULTIVITAMIN WITH MINERALS) tablet Take 1 tablet by mouth daily.    [provider]  ondansetron (ZOFRAN-ODT) 4 MG disintegrating tablet Take 1 tablet (4 mg  total) by mouth every 8 (eight) hours as needed for nausea or vomiting. 08/08/21   Carrie Mew, MD    Family History Family History  Problem Relation Age of Onset   Diabetes Maternal Grandmother    Crohn's disease Paternal Aunt    Crohn's disease Paternal Uncle    Colon cancer Neg Hx    Esophageal cancer Neg Hx    Rectal cancer Neg Hx    Stomach cancer Neg Hx    Pancreatic cancer Neg Hx    Liver cancer Neg Hx     Social History Social History   Tobacco Use   Smoking status: Every Day    Packs/day: 1.00    Years: 30.00    Total pack years: 30.00    Types: Cigarettes   Smokeless tobacco: Never  Vaping Use   Vaping Use: Never used  Substance Use Topics   Alcohol use: Yes    Comment: Occasional Beer   Drug use: Yes    Types: Marijuana    Comment: used last 1 day ago, h/o cocaine use 20 years ago.     Allergies   Patient has no known allergies.   Review of Systems Review of Systems  Constitutional:  Negative for chills and fever.  HENT:  Positive for congestion and rhinorrhea. Negative for ear pain and sore throat.   Respiratory:  Positive for cough. Negative for shortness of breath.   Cardiovascular:  Negative for chest pain and palpitations.  Gastrointestinal:  Negative for diarrhea and vomiting.  Skin:  Negative for rash.  All other systems reviewed and are negative.    Physical Exam Triage Vital Signs ED Triage Vitals  Enc Vitals Group     BP      Pulse      Resp      Temp      Temp src      SpO2      Weight      Height      Head Circumference      Peak Flow      Pain Score      Pain Loc      Pain Edu?      Excl. in [place]?    No data found.  Updated Vital Signs BP (!) 145/89   Pulse 71   Temp 98.2 F (36.8 C)   Resp 18   Ht 5' 9"  (1.753 m)   Wt 225 lb (102.1 kg)   SpO2 95%   BMI 33.23 kg/m   Visual Acuity Right Eye Distance:   Left Eye Distance:   Bilateral Distance:    Right Eye Near:   Left Eye Near:    Bilateral Near:      Physical Exam Vitals and nursing note reviewed.  Constitutional:  General: He is not in acute distress.    Appearance: Normal appearance. He is well-developed. He is not ill-appearing.  HENT:     Right Ear: Tympanic membrane normal.     Left Ear: Tympanic membrane normal.     Nose: Nose normal.     Mouth/Throat:     Mouth: Mucous membranes are moist.     Pharynx: Oropharynx is clear.  Cardiovascular:     Rate and Rhythm: Normal rate and regular rhythm.     Heart sounds: Normal heart sounds.  Pulmonary:     Effort: Pulmonary effort is normal. No respiratory distress.     Breath sounds: Wheezing present.     Comments: Faint scattered expiratory wheezes.  Musculoskeletal:     Cervical back: Neck supple.  Skin:    General: Skin is warm and dry.  Neurological:     Mental Status: He is alert.  Psychiatric:        Mood and Affect: Mood normal.        Behavior: Behavior normal.      UC Treatments / Results  Labs (all labs ordered are listed, but only abnormal results are displayed) Labs Reviewed - No data to display  EKG   Radiology No results found.  Procedures Procedures (including critical care time)  Medications Ordered in UC Medications - No data to display  Initial Impression / Assessment and Plan / UC Course  I have reviewed the triage vital signs and the nursing notes.  Pertinent labs & imaging results that were available during my care of the patient were reviewed by me and considered in my medical decision making (see chart for details).    COPD exacerbation.  No respiratory distress, O2 sat 95% on room air.  Patient has history of emphysema.  Current everyday smoker.  Patient declines COVID test.  Instructed him to continue using his albuterol inhaler as directed.  Treating with 5-day course of prednisone.  Instructed him to follow-up with his PCP if his symptoms are not improving.  Education provided on COPD.  Patient agrees to plan of care.  Final  Clinical Impressions(s) / UC Diagnoses   Final diagnoses:  COPD exacerbation ([place])     Discharge Instructions      Continue to use the albuterol inhaler as directed.  Take the prednisone as directed.    Follow up with your primary care provider if your symptoms are not improving.        ED Prescriptions     Medication Sig Dispense Auth. Provider   predniSONE (DELTASONE) 10 MG tablet Take 4 tablets (40 mg total) by mouth daily for 5 days. 20 tablet Sharion Balloon, NP      PDMP not reviewed this encounter.   Sharion Balloon, NP 03/12/22 [phone]

## 2022-03-12 NOTE — Discharge Instructions (Addendum)
Continue to use the albuterol inhaler as directed.  Take the prednisone as directed.    Follow up with your primary care provider if your symptoms are not improving.

## 2022-03-12 NOTE — ED Triage Notes (Addendum)
Patient to Urgent Care with complaints of sinus pressure/ cough/ congestion/ runny nose. Reports cough is productive with clear mucus but he has noticed some yellow when he blows his nose. Denies any known fevers. Symptoms started 2 days ago.

## 2022-03-21 NOTE — Progress Notes (Unsigned)
Complete physical  Assessment and Plan:  Encounter for general adult medical examination with abnormal findings Due Annually  Essential hypertension - continue medications, DASH diet, exercise and monitor at home. Call if greater than 130/80.  - CBC with Differential/Platelet - CMP - TSH   ASHD (arteriosclerotic heart disease) Control blood pressure, cholesterol, glucose, increase exercise.  Stop smoking  Abnormal Glucose Continue diet and exercise - A1c   Hyperlipidemia -continue medications, check lipids, decrease fatty foods, increase activity.  - Lipid panel   Crohn's disease of ileum, unspecified complication ([place]) Continue GI follow up, diet modifications Appt next month to start new medication   Vitamin D deficiency - VITAMIN D 25 Hydroxy (Vit-D Deficiency, Fractures)   Medication management - Magnesium   Left renal mass/History of renal cancer A/p nephrectomy, follow up urology   ADD (attention deficit disorder) Continue medication and behavior modifications - amphetamine-dextroamphetamine (ADDERALL) 20 MG tablet; Take 1 tablet (20 mg total) by mouth 2 (two) times daily.  Dispense: 60 tablet; Refill: 0  Bronchitis Encouraged to stop smoking - Promethazine DM q 6 hours as needed for cough - Z pak as directed - Prednisone taper as directed - Albuterol inhaler 2 puffs q 6 hours as needed - Push fluids and use Mucinex to decongest - If no improvement within the next 6 days notify the office  Smoker Advised to quit smoking, not willing at this time. Still smoking a pack a day  Screening PSA (prostate specific antigen) -     PSA  Atherosclerosis of aorta (HCC) Control blood pressure, cholesterol, glucose, increase exercise.  -     EKG 12-Lead  Pulmonary Emphysema(HCC) Continue inhalers, quit smoking and monitor symptoms  Medication management -     CBC with Differential/Platelet -     COMPLETE METABOLIC PANEL WITH GFR -     Lipid panel -     TSH -      Hemoglobin A1c -     VITAMIN D 25 Hydroxy (Vit-D Deficiency, Fractures) -     Magnesium -     EKG 12-Lead -     PSA -     Microalbumin / creatinine urine ratio -     Urinalysis, Routine w reflex microscopic  Chronic iron deficiency anemia -     CBC with Differential/Platelet  Screening for thyroid disorder -     TSH  Screening for cardiovascular condition -     EKG 12-Lead  Screening for blood or protein in urine -     Microalbumin / creatinine urine ratio -     Urinalysis, Routine w reflex microscopic  Screening for AAA - ABD U/S retroperitoneal LTD   Over 30 minutes of exam, counseling, chart review, and critical decision making was performed.  Continue diet and meds as discussed. Further disposition pending results of labs. Future Appointments  Date Time Provider [place]  05/13/2022  9:20 AM Cole Shipper, MD [facility]  03/25/2023 10:00 AM Cole Rossetti, NP [facility] None    HPI 56 y.o. smoking whit male  presents for CPE and  3 month follow up with hypertension, hyperlipidemia, prediabetes, CAD s/p stent  and vitamin D.  He was seen in the ER on 03/12/22 for exacerbation of COPD.  He was given a 5 day course of Prednisone and instructed to use albuterol inhaler as needed. States he is still having sinus issues.He has sinus congestion and has yellow nasal mucus, productive cough of yellow mucus.   Symptom has been  present x 3 weeks.  Continues to smoke 1 ppd. Denies fevers, nausea, vomiting and diarrhea.    His blood pressure has been controlled at home, today their BP is BP: 120/82 BP Readings from Last 3 Encounters:  03/24/22 120/82  03/12/22 (!) 145/89  12/10/21 124/86  He does not workout but is physically active.  He denies chest pain, shortness of breath, dizziness.      BMI is Body mass index is 34.23 kg/m., he is working on diet and exercise. Wt Readings from Last 3 Encounters:  03/24/22 231 lb 12.8 oz (105.1 kg)  03/12/22 225  lb (102.1 kg)  12/10/21 227 lb (103 kg)   Crohn's follows with Dr. Henrene [name]. He had colonoscopy 09/10/21- multiple ulcers noted and is to have repeat colonoscopy in 5 years. He is scheduled to see him in December for medication for his Crohns. Denies blood in stool, cramping, bloating and diarrhea   He also follows with Dr. Risa [name] for left renal mass found during crohn's evaluation, had left partial nephrectomy 01/17/2015 and had + renal cell carcinoma following with urology. Has not seen doctor recently  He continues to smoke but not interested in quitting. He had low dose chest CT for screening last year which showed: Lung-RADS 2, benign appearance or behavior. Continue annual screening with low-dose chest CT without contrast in 12 months. He does not want to pursue repeat this year but will schedule next year.   Granddaughter is 3 had previous heart surgery and doing well.   He has a history of ASHD S/P Stenting in 2010.  He is on ASA.  Denies dyspnea, exertional chest pressure/discomfort and irregular heart beat.   He is on cholesterol medication, lipitor 56m and denies myalgias. His cholesterol is at goal. The cholesterol last visit was:   Lab Results  Component Value Date   CHOL 207 (H) 11/05/2021   HDL 47 11/05/2021   LDLCALC 128 (H) 11/05/2021   TRIG 183 (H) 11/05/2021   CHOLHDL 4.4 11/05/2021    He has been working on diet and exercise for prediabetes, and denies paresthesia of the feet, polydipsia, polyuria and visual disturbances. Last A1C in the office was:  Lab Results  Component Value Date   HGBA1C 5.4 11/05/2021   Patient is on Vitamin D supplement.  Last vitamin D Lab Results  Component Value Date   VD25OH 30 11/05/2021    BMI is Body mass index is 34.23 kg/m., he is working on diet and exercise.  He is trying to eat less saturated fat and simple carbs. 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   Budeson-Glycopyrrol-Formoterol (BREZTRI AEROSPHERE) 160-9-4.8 MCG/ACT AERO, Inhale 2 puffs into the lungs in the morning and at bedtime.  Current Outpatient Medications (Analgesics):    aspirin 81 MG chewable tablet, Chew by mouth.  Current Outpatient Medications (Hematological):  Cyanocobalamin (VITAMIN B-12 PO), Take by mouth.   ferrous sulfate 325 (65 FE) MG tablet, Take 1 tablet (325 mg total) by mouth 2 (two) times daily. Patient needs office visit for further refills  Current Outpatient Medications (Other):    amphetamine-dextroamphetamine (ADDERALL) 20 MG tablet, Take 1 tablet (20 mg total) by mouth 2 (two) times daily.   diclofenac Sodium (VOLTAREN) 1 % GEL, Apply 4 g topically 4 (four) times daily.   Multiple Vitamins-Minerals (MULTIVITAMIN WITH MINERALS) tablet, Take 1 tablet by mouth daily.   Multiple Vitamins-Minerals (ZINC PO), Take by mouth.   ondansetron (ZOFRAN-ODT) 4 MG disintegrating tablet, Take 1 tablet (4 mg total) by mouth every 8 (eight) hours as needed for nausea or vomiting.  Medical History:  Past Medical History:  Diagnosis Date   ADD (attention deficit disorder)    Allergy    Appendicitis    Arthritis    ASHD (arteriosclerotic heart disease)    Cancer (HCC)    Colitis    Crohn's disease ([place])    Eczema    Emphysema of lung ([place])    GERD (gastroesophageal reflux disease)    Hyperlipidemia    Hypertension    Left renal mass    Morbid obesity  ([place])    Myocardial infarction ([place])    status post RCA stenting in [place] 2011   Positive for macroalbuminuria    Renal cell carcinoma, left ([place]) 08/22/2019   Dr. Risa [name] for left renal mass found during crohn's evaluation, had left partial nephrectomy 01/17/2015 and had + renal cell carcinoma    Allergies No Known Allergies  SURGICAL HISTORY He  has a past surgical history that includes Appendectomy; Coronary stent placement; Colon surgery; Robotic assited partial nephrectomy (Left, 01/17/2015); and Elbow surgery. FAMILY HISTORY His family history includes Crohn's disease in his paternal aunt and paternal uncle; Diabetes in his maternal grandmother. SOCIAL HISTORY He  reports that he has been smoking cigarettes. He has a 30.00 pack-year smoking history. He has never used smokeless tobacco. He reports current alcohol use. He reports current drug use. Drug: Marijuana.   Immunization History  Administered Date(s) Administered   Td 05/19/2009   Tdap 12/05/2019   Health Maintenance  Topic Date Due   COVID-19 Vaccine (1) Never done   HIV Screening  Never done   Hepatitis C Screening  Never done   Zoster Vaccines- Shingrix (1 of 2) Never done   Lung Cancer Screening  12/25/2020   COLONOSCOPY (Pts 45-60yr Insurance coverage will need to be confirmed)  09/11/2026   TETANUS/TDAP  12/04/2029   HPV VACCINES  Aged Out   INFLUENZA VACCINE  Discontinued    Declines the influenza vaccine and COVID vaccines  Colonoscopy 05/2017 Dr. PHenrene Pastordue 2024 EGD: 04/2017 normal Ct AB pelvis 03/17/2014 CXR 03/2018 MRI AB 08/2014 Echo 08/20161.  Low dose chest CT 12/26/19:1. Lung-RADS 2, benign appearance or behavior. Continue annual screening with low-dose chest CT without contrast in 12 months. Wants to hold this year repeat next year.  Dr. MSabra Heckeye, 2-3 months, 05/2020  Review of Systems:  Review of Systems  Constitutional: Negative.  Negative for chills and fever.  HENT:  Positive for  congestion and sinus pain. Negative for hearing loss, sore throat and tinnitus.   Eyes: Negative.  Negative for blurred vision and double vision.  Respiratory:  Positive for cough (productive) and sputum production (yellow). Negative for hemoptysis, shortness of breath and wheezing.   Cardiovascular: Negative.  Negative for chest pain, palpitations and  leg swelling.  Gastrointestinal: Negative.  Negative for abdominal pain, constipation, diarrhea, heartburn, nausea and vomiting.  Genitourinary: Negative.  Negative for dysuria and urgency.  Musculoskeletal:  Negative for back pain, falls, joint pain, myalgias and neck pain.  Skin:  Negative for rash.  Neurological: Negative.  Negative for dizziness, tingling, tremors, weakness and headaches.  Endo/Heme/Allergies: Negative.  Does not bruise/bleed easily.  Psychiatric/Behavioral: Negative.  Negative for depression and suicidal ideas. The patient is not nervous/anxious and does not have insomnia.    Physical Exam: BP 120/82   Pulse 78   Temp 97.9 F (36.6 C)   Ht 5' 9"  (1.753 m)   Wt 231 lb 12.8 oz (105.1 kg)   SpO2 97%   BMI 34.23 kg/m  Wt Readings from Last 3 Encounters:  03/24/22 231 lb 12.8 oz (105.1 kg)  03/12/22 225 lb (102.1 kg)  12/10/21 227 lb (103 kg)   General Appearance: Well nourished, in no apparent distress. Eyes: PERRLA, EOMs, conjunctiva no swelling or erythema Sinuses: No Frontal/maxillary tenderness ENT/Mouth: Ext aud canals clear, TMs without erythema, bulging. No erythema, swelling, or exudate on post pharynx.  Tonsils not swollen or erythematous. Hearing normal.  Neck: Supple, thyroid normal.  Respiratory: Respiratory effort normal, BS expiratory wheezes bilaterally on exertion. Neb treatment done in the office with duoneb- wheezing decreased dramatically after neb treatment Cardio: RRR with no MRGs. Brisk peripheral pulses without edema.  Abdomen: Soft, + BS, obese,  Non tender, no guarding, rebound, hernias,  masses. Lymphatics: Non tender without lymphadenopathy.  Musculoskeletal: Full ROM, 5/5 strength, Normal Skin: Several healed scars along AB. Warm, dry without rashes, lesions, ecchymosis.  Neuro: Cranial nerves intact. Normal muscle tone except some minor muscle wasting lateral right hand with decrease extension of 3 lateral fingers, normal distal vascular, no cerebellar symptoms. Psych: Awake and oriented X 3, normal affect, Insight and Judgment appropriate.     EKG IRBBB, 1st degree Av block- no ST changes AAA: < 3 cm   Cole Hamman Mikki Santee, NP 10:23 AM [facility]

## 2022-03-24 ENCOUNTER — Ambulatory Visit: Payer: Federal, State, Local not specified - PPO | Admitting: Nurse Practitioner

## 2022-03-24 ENCOUNTER — Encounter: Payer: Self-pay | Admitting: Nurse Practitioner

## 2022-03-24 VITALS — BP 120/82 | HR 78 | Temp 97.9°F | Ht 69.0 in | Wt 231.8 lb

## 2022-03-24 DIAGNOSIS — Z13220 Encounter for screening for lipoid disorders: Secondary | ICD-10-CM | POA: Diagnosis not present

## 2022-03-24 DIAGNOSIS — R350 Frequency of micturition: Secondary | ICD-10-CM | POA: Diagnosis not present

## 2022-03-24 DIAGNOSIS — Z0001 Encounter for general adult medical examination with abnormal findings: Secondary | ICD-10-CM

## 2022-03-24 DIAGNOSIS — K50019 Crohn's disease of small intestine with unspecified complications: Secondary | ICD-10-CM

## 2022-03-24 DIAGNOSIS — Z85528 Personal history of other malignant neoplasm of kidney: Secondary | ICD-10-CM

## 2022-03-24 DIAGNOSIS — N401 Enlarged prostate with lower urinary tract symptoms: Secondary | ICD-10-CM | POA: Diagnosis not present

## 2022-03-24 DIAGNOSIS — Z1329 Encounter for screening for other suspected endocrine disorder: Secondary | ICD-10-CM

## 2022-03-24 DIAGNOSIS — I10 Essential (primary) hypertension: Secondary | ICD-10-CM

## 2022-03-24 DIAGNOSIS — R7309 Other abnormal glucose: Secondary | ICD-10-CM

## 2022-03-24 DIAGNOSIS — Z125 Encounter for screening for malignant neoplasm of prostate: Secondary | ICD-10-CM | POA: Diagnosis not present

## 2022-03-24 DIAGNOSIS — Z79899 Other long term (current) drug therapy: Secondary | ICD-10-CM

## 2022-03-24 DIAGNOSIS — J40 Bronchitis, not specified as acute or chronic: Secondary | ICD-10-CM

## 2022-03-24 DIAGNOSIS — Z1389 Encounter for screening for other disorder: Secondary | ICD-10-CM

## 2022-03-24 DIAGNOSIS — Z0000 Encounter for general adult medical examination without abnormal findings: Secondary | ICD-10-CM | POA: Diagnosis not present

## 2022-03-24 DIAGNOSIS — I2510 Atherosclerotic heart disease of native coronary artery without angina pectoris: Secondary | ICD-10-CM

## 2022-03-24 DIAGNOSIS — Z131 Encounter for screening for diabetes mellitus: Secondary | ICD-10-CM | POA: Diagnosis not present

## 2022-03-24 DIAGNOSIS — E559 Vitamin D deficiency, unspecified: Secondary | ICD-10-CM

## 2022-03-24 DIAGNOSIS — E785 Hyperlipidemia, unspecified: Secondary | ICD-10-CM

## 2022-03-24 DIAGNOSIS — I700 Atherosclerosis of aorta: Secondary | ICD-10-CM | POA: Diagnosis not present

## 2022-03-24 DIAGNOSIS — Z136 Encounter for screening for cardiovascular disorders: Secondary | ICD-10-CM | POA: Diagnosis not present

## 2022-03-24 DIAGNOSIS — D509 Iron deficiency anemia, unspecified: Secondary | ICD-10-CM

## 2022-03-24 DIAGNOSIS — F988 Other specified behavioral and emotional disorders with onset usually occurring in childhood and adolescence: Secondary | ICD-10-CM

## 2022-03-24 DIAGNOSIS — F17200 Nicotine dependence, unspecified, uncomplicated: Secondary | ICD-10-CM

## 2022-03-24 DIAGNOSIS — J439 Emphysema, unspecified: Secondary | ICD-10-CM

## 2022-03-24 MED ORDER — ALBUTEROL SULFATE HFA 108 (90 BASE) MCG/ACT IN AERS: 1.0000 | INHALATION_SPRAY | RESPIRATORY_TRACT | 2 refills | Status: AC | PRN

## 2022-03-24 MED ORDER — PROMETHAZINE-DM 6.25-15 MG/5ML PO SYRP: 5.0000 mL | ORAL_SOLUTION | Freq: Four times a day (QID) | ORAL | 1 refills | Status: DC | PRN

## 2022-03-24 MED ORDER — AMPHETAMINE-DEXTROAMPHETAMINE 20 MG PO TABS: 20.0000 mg | ORAL_TABLET | Freq: Two times a day (BID) | ORAL | 0 refills | Status: DC

## 2022-03-24 MED ORDER — AZITHROMYCIN 250 MG PO TABS: ORAL_TABLET | ORAL | 1 refills | Status: DC

## 2022-03-24 MED ORDER — PREDNISONE 20 MG PO TABS: ORAL_TABLET | ORAL | 0 refills | Status: AC

## 2022-03-24 NOTE — Patient Instructions (Signed)

## 2022-03-25 LAB — CBC WITH DIFFERENTIAL/PLATELET
Absolute Monocytes: 778 cells/uL (ref 200–950)
Basophils Absolute: 40 cells/uL (ref 0–200)
Basophils Relative: 0.4 %
Eosinophils Absolute: 222 cells/uL (ref 15–500)
Eosinophils Relative: 2.2 %
HCT: 47.7 % (ref 38.5–50.0)
Hemoglobin: 16.2 g/dL (ref 13.2–17.1)
Lymphs Abs: 1505 cells/uL (ref 850–3900)
MCH: 30.6 pg (ref 27.0–33.0)
MCHC: 34 g/dL (ref 32.0–36.0)
MCV: 90.2 fL (ref 80.0–100.0)
MPV: 10.2 fL (ref 7.5–12.5)
Monocytes Relative: 7.7 %
Neutro Abs: 7555 cells/uL (ref 1500–7800)
Neutrophils Relative %: 74.8 %
Platelets: 314 10*3/uL (ref 140–400)
RBC: 5.29 10*6/uL (ref 4.20–5.80)
RDW: 13.4 % (ref 11.0–15.0)
Total Lymphocyte: 14.9 %
WBC: 10.1 10*3/uL (ref 3.8–10.8)

## 2022-03-25 LAB — URINALYSIS, ROUTINE W REFLEX MICROSCOPIC
Bacteria, UA: NONE SEEN /HPF
Bilirubin Urine: NEGATIVE
Glucose, UA: NEGATIVE
Hgb urine dipstick: NEGATIVE
Hyaline Cast: NONE SEEN /LPF
Ketones, ur: NEGATIVE
Leukocytes,Ua: NEGATIVE
Nitrite: NEGATIVE
RBC / HPF: NONE SEEN /HPF (ref 0–2)
Specific Gravity, Urine: 1.018 (ref 1.001–1.035)
Squamous Epithelial / HPF: NONE SEEN /HPF (ref <=5)
WBC, UA: NONE SEEN /HPF (ref 0–5)
pH: 5.5 (ref 5.0–8.0)

## 2022-03-25 LAB — TSH: TSH: 2 mIU/L (ref 0.40–4.50)

## 2022-03-25 LAB — COMPLETE METABOLIC PANEL WITH GFR
AG Ratio: 1.2 (calc) (ref 1.0–2.5)
ALT: 40 U/L (ref 9–46)
AST: 21 U/L (ref 10–35)
Albumin: 4 g/dL (ref 3.6–5.1)
Alkaline phosphatase (APISO): 109 U/L (ref 35–144)
BUN: 18 mg/dL (ref 7–25)
CO2: 27 mmol/L (ref 20–32)
Calcium: 9.6 mg/dL (ref 8.6–10.3)
Chloride: 105 mmol/L (ref 98–110)
Creat: 1.17 mg/dL (ref 0.70–1.30)
Globulin: 3.3 g/dL (calc) (ref 1.9–3.7)
Glucose, Bld: 85 mg/dL (ref 65–99)
Potassium: 4.7 mmol/L (ref 3.5–5.3)
Sodium: 139 mmol/L (ref 135–146)
Total Bilirubin: 0.4 mg/dL (ref 0.2–1.2)
Total Protein: 7.3 g/dL (ref 6.1–8.1)
eGFR: 73 mL/min/{1.73_m2} (ref >=60)

## 2022-03-25 LAB — LIPID PANEL
Cholesterol: 164 mg/dL (ref <200)
HDL: 41 mg/dL (ref >=40)
LDL Cholesterol (Calc): 87 mg/dL (calc)
Non-HDL Cholesterol (Calc): 123 mg/dL (calc) (ref <130)
Total CHOL/HDL Ratio: 4 (calc) (ref <5.0)
Triglycerides: 277 mg/dL — ABNORMAL HIGH (ref <150)

## 2022-03-25 LAB — HEMOGLOBIN A1C
Hgb A1c MFr Bld: 5.7 % of total Hgb — ABNORMAL HIGH (ref <5.7)
Mean Plasma Glucose: 117 mg/dL
eAG (mmol/L): 6.5 mmol/L

## 2022-03-25 LAB — VITAMIN D 25 HYDROXY (VIT D DEFICIENCY, FRACTURES): Vit D, 25-Hydroxy: 30 ng/mL (ref 30–100)

## 2022-03-25 LAB — MICROALBUMIN / CREATININE URINE RATIO
Creatinine, Urine: 146 mg/dL (ref 20–320)
Microalb Creat Ratio: 171 mcg/mg creat — ABNORMAL HIGH (ref <30)
Microalb, Ur: 24.9 mg/dL

## 2022-03-25 LAB — PSA: PSA: 0.48 ng/mL (ref <=4.00)

## 2022-03-25 LAB — MAGNESIUM: Magnesium: 2.1 mg/dL (ref 1.5–2.5)

## 2022-03-26 ENCOUNTER — Encounter: Payer: Self-pay | Admitting: Emergency Medicine

## 2022-03-26 ENCOUNTER — Emergency Department
Admission: EM | Admit: 2022-03-26 | Discharge: 2022-03-26 | Disposition: A | Discharge status: Other Acute Inpatient Hospital | Payer: Federal, State, Local not specified - PPO | Attending: Emergency Medicine | Admitting: Emergency Medicine

## 2022-03-26 ENCOUNTER — Other Ambulatory Visit: Payer: Self-pay

## 2022-03-26 ENCOUNTER — Emergency Department: Payer: Federal, State, Local not specified - PPO

## 2022-03-26 DIAGNOSIS — I700 Atherosclerosis of aorta: Secondary | ICD-10-CM | POA: Diagnosis not present

## 2022-03-26 DIAGNOSIS — T22212A Burn of second degree of left forearm, initial encounter: Secondary | ICD-10-CM | POA: Diagnosis not present

## 2022-03-26 DIAGNOSIS — F17210 Nicotine dependence, cigarettes, uncomplicated: Secondary | ICD-10-CM | POA: Diagnosis not present

## 2022-03-26 DIAGNOSIS — T22312A Burn of third degree of left forearm, initial encounter: Secondary | ICD-10-CM | POA: Diagnosis not present

## 2022-03-26 DIAGNOSIS — T3111 Burns involving 10-19% of body surface with 10-19% third degree burns: Secondary | ICD-10-CM | POA: Diagnosis not present

## 2022-03-26 DIAGNOSIS — X04XXXA Exposure to ignition of highly flammable material, initial encounter: Secondary | ICD-10-CM | POA: Insufficient documentation

## 2022-03-26 DIAGNOSIS — E785 Hyperlipidemia, unspecified: Secondary | ICD-10-CM | POA: Diagnosis not present

## 2022-03-26 DIAGNOSIS — T310 Burns involving less than 10% of body surface: Secondary | ICD-10-CM | POA: Diagnosis not present

## 2022-03-26 DIAGNOSIS — T3110 Burns involving 10-19% of body surface with 0% to 9% third degree burns: Secondary | ICD-10-CM

## 2022-03-26 DIAGNOSIS — I2510 Atherosclerotic heart disease of native coronary artery without angina pectoris: Secondary | ICD-10-CM | POA: Diagnosis not present

## 2022-03-26 DIAGNOSIS — Z20822 Contact with and (suspected) exposure to covid-19: Secondary | ICD-10-CM | POA: Diagnosis not present

## 2022-03-26 DIAGNOSIS — I517 Cardiomegaly: Secondary | ICD-10-CM | POA: Diagnosis not present

## 2022-03-26 DIAGNOSIS — J40 Bronchitis, not specified as acute or chronic: Secondary | ICD-10-CM | POA: Diagnosis not present

## 2022-03-26 DIAGNOSIS — Z23 Encounter for immunization: Secondary | ICD-10-CM | POA: Diagnosis not present

## 2022-03-26 DIAGNOSIS — X58XXXA Exposure to other specified factors, initial encounter: Secondary | ICD-10-CM | POA: Diagnosis not present

## 2022-03-26 DIAGNOSIS — S51802A Unspecified open wound of left forearm, initial encounter: Secondary | ICD-10-CM | POA: Diagnosis not present

## 2022-03-26 DIAGNOSIS — Z7409 Other reduced mobility: Secondary | ICD-10-CM | POA: Diagnosis not present

## 2022-03-26 DIAGNOSIS — Z955 Presence of coronary angioplasty implant and graft: Secondary | ICD-10-CM | POA: Diagnosis not present

## 2022-03-26 DIAGNOSIS — I10 Essential (primary) hypertension: Secondary | ICD-10-CM | POA: Diagnosis not present

## 2022-03-26 MED ORDER — MORPHINE SULFATE (PF) 4 MG/ML IV SOLN
4.0000 mg | Freq: Once | INTRAVENOUS | Status: AC
  Administered 2022-03-26: 4 mg via INTRAVENOUS
  Filled 2022-03-26: qty 1

## 2022-03-26 MED ORDER — ONDANSETRON HCL 4 MG/2ML IJ SOLN
4.0000 mg | Freq: Once | INTRAMUSCULAR | Status: AC
  Administered 2022-03-26: 4 mg via INTRAVENOUS
  Filled 2022-03-26: qty 2

## 2022-03-26 MED ORDER — SILVER SULFADIAZINE 1 % EX CREA
TOPICAL_CREAM | Freq: Once | CUTANEOUS | Status: AC
  Filled 2022-03-26: qty 85

## 2022-03-26 MED ORDER — TETANUS-DIPHTH-ACELL PERTUSSIS 5-2.5-18.5 LF-MCG/0.5 IM SUSY
0.5000 mL | PREFILLED_SYRINGE | Freq: Once | INTRAMUSCULAR | Status: AC
  Administered 2022-03-26: 0.5 mL via INTRAMUSCULAR
  Filled 2022-03-26: qty 0.5
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
## 2022-03-27 DIAGNOSIS — S51802A Unspecified open wound of left forearm, initial encounter: Secondary | ICD-10-CM | POA: Diagnosis not present

## 2022-03-27 DIAGNOSIS — X088XXA Exposure to other specified smoke, fire and flames, initial encounter: Secondary | ICD-10-CM | POA: Diagnosis not present

## 2022-03-27 DIAGNOSIS — Z20822 Contact with and (suspected) exposure to covid-19: Secondary | ICD-10-CM | POA: Diagnosis not present

## 2022-03-27 DIAGNOSIS — Z79899 Other long term (current) drug therapy: Secondary | ICD-10-CM | POA: Diagnosis not present

## 2022-03-27 DIAGNOSIS — X58XXXA Exposure to other specified factors, initial encounter: Secondary | ICD-10-CM | POA: Diagnosis not present

## 2022-03-27 DIAGNOSIS — Z955 Presence of coronary angioplasty implant and graft: Secondary | ICD-10-CM | POA: Diagnosis not present

## 2022-03-27 DIAGNOSIS — I10 Essential (primary) hypertension: Secondary | ICD-10-CM | POA: Diagnosis not present

## 2022-03-27 DIAGNOSIS — T22312A Burn of third degree of left forearm, initial encounter: Secondary | ICD-10-CM | POA: Diagnosis not present

## 2022-03-27 DIAGNOSIS — I2510 Atherosclerotic heart disease of native coronary artery without angina pectoris: Secondary | ICD-10-CM | POA: Diagnosis not present

## 2022-03-27 DIAGNOSIS — F17210 Nicotine dependence, cigarettes, uncomplicated: Secondary | ICD-10-CM | POA: Diagnosis not present

## 2022-03-27 DIAGNOSIS — T310 Burns involving less than 10% of body surface: Secondary | ICD-10-CM | POA: Diagnosis not present

## 2022-03-27 DIAGNOSIS — E785 Hyperlipidemia, unspecified: Secondary | ICD-10-CM | POA: Diagnosis not present

## 2022-03-27 DIAGNOSIS — Z7409 Other reduced mobility: Secondary | ICD-10-CM | POA: Diagnosis not present

## 2022-04-01 DIAGNOSIS — T22312D Burn of third degree of left forearm, subsequent encounter: Secondary | ICD-10-CM | POA: Diagnosis not present

## 2022-04-01 DIAGNOSIS — T23261A Burn of second degree of back of right hand, initial encounter: Secondary | ICD-10-CM | POA: Diagnosis not present

## 2022-04-01 DIAGNOSIS — G8911 Acute pain due to trauma: Secondary | ICD-10-CM | POA: Diagnosis not present

## 2022-04-01 DIAGNOSIS — Z79899 Other long term (current) drug therapy: Secondary | ICD-10-CM | POA: Diagnosis not present

## 2022-04-01 DIAGNOSIS — T2010XD Burn of first degree of head, face, and neck, unspecified site, subsequent encounter: Secondary | ICD-10-CM | POA: Diagnosis not present

## 2022-04-01 DIAGNOSIS — T2220XA Burn of second degree of shoulder and upper limb, except wrist and hand, unspecified site, initial encounter: Secondary | ICD-10-CM | POA: Diagnosis not present

## 2022-04-01 DIAGNOSIS — T310 Burns involving less than 10% of body surface: Secondary | ICD-10-CM | POA: Diagnosis not present

## 2022-04-01 DIAGNOSIS — T2020XD Burn of second degree of head, face, and neck, unspecified site, subsequent encounter: Secondary | ICD-10-CM | POA: Diagnosis not present

## 2022-04-01 DIAGNOSIS — Z7982 Long term (current) use of aspirin: Secondary | ICD-10-CM | POA: Diagnosis not present

## 2022-04-01 DIAGNOSIS — X088XXA Exposure to other specified smoke, fire and flames, initial encounter: Secondary | ICD-10-CM | POA: Diagnosis not present

## 2022-04-14 DIAGNOSIS — T23291D Burn of second degree of multiple sites of right wrist and hand, subsequent encounter: Secondary | ICD-10-CM | POA: Diagnosis not present

## 2022-04-14 DIAGNOSIS — T2020XD Burn of second degree of head, face, and neck, unspecified site, subsequent encounter: Secondary | ICD-10-CM | POA: Diagnosis not present

## 2022-04-14 DIAGNOSIS — T23231D Burn of second degree of multiple right fingers (nail), not including thumb, subsequent encounter: Secondary | ICD-10-CM | POA: Diagnosis not present

## 2022-04-14 DIAGNOSIS — T22292D Burn of second degree of multiple sites of left shoulder and upper limb, except wrist and hand, subsequent encounter: Secondary | ICD-10-CM | POA: Diagnosis not present

## 2022-04-14 DIAGNOSIS — T23292D Burn of second degree of multiple sites of left wrist and hand, subsequent encounter: Secondary | ICD-10-CM | POA: Diagnosis not present

## 2022-04-23 ENCOUNTER — Encounter: Payer: Self-pay | Admitting: Nurse Practitioner

## 2022-04-23 ENCOUNTER — Ambulatory Visit: Payer: Federal, State, Local not specified - PPO | Admitting: Nurse Practitioner

## 2022-04-23 VITALS — BP 136/84 | HR 68 | Temp 97.2°F | Ht 69.0 in | Wt 235.0 lb

## 2022-04-23 DIAGNOSIS — Z79899 Other long term (current) drug therapy: Secondary | ICD-10-CM | POA: Diagnosis not present

## 2022-04-23 DIAGNOSIS — I10 Essential (primary) hypertension: Secondary | ICD-10-CM | POA: Diagnosis not present

## 2022-04-23 NOTE — Patient Instructions (Signed)
Hypertension, Adult Hypertension is another name for high blood pressure. High blood pressure forces your heart to work harder to pump blood. This can cause problems over time. There are two numbers in a blood pressure reading. There is a top number (systolic) over a bottom number (diastolic). It is best to have a blood pressure that is below 120/80. What are the causes? The cause of this condition is not known. Some other conditions can lead to high blood pressure. What increases the risk? Some lifestyle factors can make you more likely to develop high blood pressure: Smoking. Not getting enough exercise or physical activity. Being overweight. Having too much fat, sugar, calories, or salt (sodium) in your diet. Drinking too much alcohol. Other risk factors include: Having any of these conditions: Heart disease. Diabetes. High cholesterol. Kidney disease. Obstructive sleep apnea. Having a family history of high blood pressure and high cholesterol. Age. The risk increases with age. Stress. What are the signs or symptoms? High blood pressure may not cause symptoms. Very high blood pressure (hypertensive crisis) may cause: Headache. Fast or uneven heartbeats (palpitations). Shortness of breath. Nosebleed. Vomiting or feeling like you may vomit (nauseous). Changes in how you see. Very bad chest pain. Feeling dizzy. Seizures. How is this treated? This condition is treated by making healthy lifestyle changes, such as: Eating healthy foods. Exercising more. Drinking less alcohol. Your doctor may prescribe medicine if lifestyle changes do not help enough and if: Your top number is above 130. Your bottom number is above 80. Your personal target blood pressure may vary. Follow these instructions at home: Eating and drinking  If told, follow the DASH eating plan. To follow this plan: Fill one half of your plate at each meal with fruits and vegetables. Fill one fourth of your plate  at each meal with whole grains. Whole grains include whole-wheat pasta, brown rice, and whole-grain bread. Eat or drink low-fat dairy products, such as skim milk or low-fat yogurt. Fill one fourth of your plate at each meal with low-fat (lean) proteins. Low-fat proteins include fish, chicken without skin, eggs, beans, and tofu. Avoid fatty meat, cured and processed meat, or chicken with skin. Avoid pre-made or processed food. Limit the amount of salt in your diet to less than 1,500 mg each day. Do not drink alcohol if: Your doctor tells you not to drink. You are pregnant, may be pregnant, or are planning to become pregnant. If you drink alcohol: Limit how much you have to: 0-1 drink a day for women. 0-2 drinks a day for men. Know how much alcohol is in your drink. In the [place] one drink equals one 12 oz bottle of beer (355 mL), one 5 oz glass of wine (148 mL), or one 1 oz glass of hard liquor (44 mL). Lifestyle  Work with your doctor to stay at a healthy weight or to lose weight. Ask your doctor what the best weight is for you. Get at least 30 minutes of exercise that causes your heart to beat faster (aerobic exercise) most days of the week. This may include walking, swimming, or biking. Get at least 30 minutes of exercise that strengthens your muscles (resistance exercise) at least 3 days a week. This may include lifting weights or doing Pilates. Do not smoke or use any products that contain nicotine or tobacco. If you need help quitting, ask your doctor. Check your blood pressure at home as told by your doctor. Keep all follow-up visits. Medicines Take over-the-counter and prescription medicines  only as told by your doctor. Follow directions carefully. Do not skip doses of blood pressure medicine. The medicine does not work as well if you skip doses. Skipping doses also puts you at risk for problems. Ask your doctor about side effects or reactions to medicines that you should watch  for. Contact a doctor if: You think you are having a reaction to the medicine you are taking. You have headaches that keep coming back. You feel dizzy. You have swelling in your ankles. You have trouble with your vision. Get help right away if: You get a very bad headache. You start to feel mixed up (confused). You feel weak or numb. You feel faint. You have very bad pain in your: Chest. Belly (abdomen). You vomit more than once. You have trouble breathing. These symptoms may be an emergency. Get help right away. Call 911. Do not wait to see if the symptoms will go away. Do not drive yourself to the hospital. Summary Hypertension is another name for high blood pressure. High blood pressure forces your heart to work harder to pump blood. For most people, a normal blood pressure is less than 120/80. Making healthy choices can help lower blood pressure. If your blood pressure does not get lower with healthy choices, you may need to take medicine. This information is not intended to replace advice given to you by your health care provider. Make sure you discuss any questions you have with your health care provider. Document Revised: 02/21/2021 Document Reviewed: 02/21/2021 Elsevier Patient Education  [place].

## 2022-04-23 NOTE — Progress Notes (Signed)
Assessment and Plan:  Cole [name] was seen today for an episodic visit.  Diagnoses and all order for this visit:   1. Primary hypertension Controlled Continue Metoprolol as directed. Discussed DASH (Dietary Approaches to Stop Hypertension) DASH diet is lower in sodium than a typical American diet. Cut back on foods that are high in saturated fat, cholesterol, and trans fats. Eat more whole-grain foods, fish, poultry, and nuts Remain active and exercise as tolerated daily.  Monitor BP at home-Call if greater than 130/80.  Check CMP/CBC   2. Medication management All medications discussed and reviewed in full. All questions and concerns regarding medications addressed.    Notify office for further evaluation and treatment, questions or concerns if s/s fail to improve. The risks and benefits of my recommendations, as well as other treatment options were discussed with the patient today. Questions were answered.   Notify office for further evaluation and treatment, questions or concerns if any reported s/s fail to improve.   The patient was advised to call back or seek an in-person evaluation if any symptoms worsen or if the condition fails to improve as anticipated.   Further disposition pending results of labs. Discussed med's effects and SE's.    I discussed the assessment and treatment plan with the patient. The patient was provided an opportunity to ask questions and all were answered. The patient agreed with the plan and demonstrated an understanding of the instructions.  Discussed med's effects and SE's. Screening labs and tests as requested with regular follow-up as recommended.  I provided 15 minutes of face-to-face time during this encounter including counseling, chart review, and critical decision making was preformed.   Future Appointments  Date Time Provider [employer]  05/13/2022  9:20 AM Irene Shipper, MD [facility]  09/25/2022  9:30 AM Alycia Rossetti, NP [facility] None  03/25/2023 10:00 AM Alycia Rossetti, NP [facility] None    ------------------------------------------------------------------------------------------------------------------   HPI BP 136/84   Pulse 68   Temp (!) 97.2 F (36.2 C)   Ht 5' 9"  (1.753 m)   Wt 235 lb (106.6 kg)   SpO2 97%   BMI 34.70 kg/m   56 y.o.male presents for evaluation of blood pressure.  Recently had work performance exam where BP was >160/100.  States it was due to nerves, as well as staying up the night before working and drinking coffee.    His BP is well controlled in clinic today.  He reports medication compliance.   Denies any CP, heart palpitations, SOB, dyspnea.    Past Medical History:  Diagnosis Date   ADD (attention deficit disorder)    Allergy    Appendicitis    Arthritis    ASHD (arteriosclerotic heart disease)    Cancer (HCC)    Colitis    Crohn's disease ([place])    Eczema    Emphysema of lung ([place])    GERD (gastroesophageal reflux disease)    Hyperlipidemia    Hypertension    Left renal mass    Morbid obesity ([place])    Myocardial infarction [facility])    status post RCA stenting in [place] 2011   Positive for macroalbuminuria    Renal cell carcinoma, left ([place]) 08/22/2019   Dr. Risa Grill for left renal mass found during crohn's evaluation, had left partial nephrectomy 01/17/2015 and had + renal cell carcinoma      No Known Allergies  Current Outpatient Medications on File Prior to Visit  Medication Sig   albuterol (  VENTOLIN HFA) 108 (90 Base) MCG/ACT inhaler Inhale 1-2 puffs into the lungs every 4 (four) hours as needed for wheezing or shortness of breath.   amphetamine-dextroamphetamine (ADDERALL) 20 MG tablet Take 1 tablet (20 mg total) by mouth 2 (two) times daily.   aspirin 81 MG chewable tablet Chew by mouth.   atorvastatin (LIPITOR) 80 MG tablet Take  1 tablet  Daily  for Cholesterol   Budeson-Glycopyrrol-Formoterol (BREZTRI AEROSPHERE) 160-9-4.8  MCG/ACT AERO Inhale 2 puffs into the lungs in the morning and at bedtime.   Cyanocobalamin (VITAMIN B-12 PO) Take by mouth.   diclofenac Sodium (VOLTAREN) 1 % GEL Apply 4 g topically 4 (four) times daily.   ferrous sulfate 325 (65 FE) MG tablet Take 1 tablet (325 mg total) by mouth 2 (two) times daily. Patient needs office visit for further refills   metoprolol tartrate (LOPRESSOR) 25 MG tablet TAKE ONE TABLET BY MOUTH TWICE A DAY   Multiple Vitamins-Minerals (MULTIVITAMIN WITH MINERALS) tablet Take 1 tablet by mouth daily.   Multiple Vitamins-Minerals (ZINC PO) Take by mouth.   ondansetron (ZOFRAN-ODT) 4 MG disintegrating tablet Take 1 tablet (4 mg total) by mouth every 8 (eight) hours as needed for nausea or vomiting.   promethazine-dextromethorphan (PROMETHAZINE-DM) 6.25-15 MG/5ML syrup Take 5 mLs by mouth 4 (four) times daily as needed for cough.   azithromycin (ZITHROMAX) 250 MG tablet Take 2 tablets (500 mg) on  Day 1,  followed by 1 tablet (250 mg) once daily on Days 2 through 5.   No current facility-administered medications on file prior to visit.    ROS: all negative except what is noted in the HPI.   Physical Exam:  BP 136/84   Pulse 68   Temp (!) 97.2 F (36.2 C)   Ht 5' 9"  (1.753 m)   Wt 235 lb (106.6 kg)   SpO2 97%   BMI 34.70 kg/m   General Appearance: NAD.  Awake, conversant and cooperative. Eyes: PERRLA, EOMs intact.  Sclera white.  Conjunctiva without erythema. Sinuses: No frontal/maxillary tenderness.  No nasal discharge. Nares patent.  ENT/Mouth: Ext aud canals clear.  Bilateral TMs w/DOL and without erythema or bulging. Hearing intact.  Posterior pharynx without swelling or exudate.  Tonsils without swelling or erythema.  Neck: Supple.  No masses, nodules or thyromegaly. Respiratory: Effort is regular with non-labored breathing. Breath sounds are equal bilaterally without rales, rhonchi, wheezing or stridor.  Cardio: RRR with no MRGs. Brisk peripheral pulses  without edema.  Abdomen: Active BS in all four quadrants.  Soft and non-tender without guarding, rebound tenderness, hernias or masses. Lymphatics: Non tender without lymphadenopathy.  Musculoskeletal: Full ROM, 5/5 strength, normal ambulation.  No clubbing or cyanosis. Skin: Appropriate color for ethnicity. Warm without rashes, lesions, ecchymosis, ulcers.  Neuro: CN II-XII grossly normal. Normal muscle tone without cerebellar symptoms and intact sensation.   Psych: AO X 3,  appropriate mood and affect, insight and judgment.     Darrol Jump, NP 9:08 AM [facility]

## 2022-05-13 ENCOUNTER — Ambulatory Visit: Payer: Federal, State, Local not specified - PPO | Admitting: Internal Medicine

## 2022-05-13 ENCOUNTER — Other Ambulatory Visit: Payer: Self-pay

## 2022-05-13 ENCOUNTER — Telehealth: Payer: Self-pay | Admitting: Internal Medicine

## 2022-05-13 DIAGNOSIS — I10 Essential (primary) hypertension: Secondary | ICD-10-CM

## 2022-05-13 MED ORDER — METOPROLOL TARTRATE 25 MG PO TABS: 25.0000 mg | ORAL_TABLET | Freq: Two times a day (BID) | ORAL | 1 refills | Status: DC

## 2022-05-13 NOTE — Telephone Encounter (Signed)
Good Morning Dr. [name],   Patient called stating that he needed to reschedule his appointment with you this morning at 9:20 due to having a family emergency.   Patient was rescheduled for 1/24 at 9:20

## 2022-06-11 ENCOUNTER — Ambulatory Visit: Payer: Federal, State, Local not specified - PPO | Admitting: Internal Medicine

## 2022-06-11 ENCOUNTER — Other Ambulatory Visit (INDEPENDENT_AMBULATORY_CARE_PROVIDER_SITE_OTHER): Payer: Federal, State, Local not specified - PPO

## 2022-06-11 ENCOUNTER — Encounter: Payer: Self-pay | Admitting: Internal Medicine

## 2022-06-11 VITALS — BP 138/82 | HR 70 | Ht 69.0 in | Wt 234.0 lb

## 2022-06-11 DIAGNOSIS — R197 Diarrhea, unspecified: Secondary | ICD-10-CM | POA: Diagnosis not present

## 2022-06-11 DIAGNOSIS — R1031 Right lower quadrant pain: Secondary | ICD-10-CM | POA: Diagnosis not present

## 2022-06-11 DIAGNOSIS — D509 Iron deficiency anemia, unspecified: Secondary | ICD-10-CM

## 2022-06-11 DIAGNOSIS — K50019 Crohn's disease of small intestine with unspecified complications: Secondary | ICD-10-CM

## 2022-06-11 DIAGNOSIS — D128 Benign neoplasm of rectum: Secondary | ICD-10-CM

## 2022-06-11 LAB — CBC WITH DIFFERENTIAL/PLATELET
Basophils Absolute: 0 10*3/uL (ref 0.0–0.1)
Basophils Relative: 0.5 % (ref 0.0–3.0)
Eosinophils Absolute: 0.2 10*3/uL (ref 0.0–0.7)
Eosinophils Relative: 2.5 % (ref 0.0–5.0)
HCT: 43.3 % (ref 39.0–52.0)
Hemoglobin: 14.8 g/dL (ref 13.0–17.0)
Lymphocytes Relative: 12.7 % (ref 12.0–46.0)
Lymphs Abs: 0.9 10*3/uL (ref 0.7–4.0)
MCHC: 34.2 g/dL (ref 30.0–36.0)
MCV: 91.9 fl (ref 78.0–100.0)
Monocytes Absolute: 0.5 10*3/uL (ref 0.1–1.0)
Monocytes Relative: 6.5 % (ref 3.0–12.0)
Neutro Abs: 5.4 10*3/uL (ref 1.4–7.7)
Neutrophils Relative %: 77.8 % — ABNORMAL HIGH (ref 43.0–77.0)
Platelets: 299 10*3/uL (ref 150.0–400.0)
RBC: 4.71 Mil/uL (ref 4.22–5.81)
RDW: 14.7 % (ref 11.5–15.5)
WBC: 7 10*3/uL (ref 4.0–10.5)

## 2022-06-11 LAB — HEPATIC FUNCTION PANEL
ALT: 23 U/L (ref 0–53)
AST: 19 U/L (ref 0–37)
Albumin: 4.1 g/dL (ref 3.5–5.2)
Alkaline Phosphatase: 93 U/L (ref 39–117)
Bilirubin, Direct: 0.1 mg/dL (ref 0.0–0.3)
Total Bilirubin: 0.4 mg/dL (ref 0.2–1.2)
Total Protein: 7.4 g/dL (ref 6.0–8.3)

## 2022-06-11 LAB — BASIC METABOLIC PANEL
BUN: 17 mg/dL (ref 6–23)
CO2: 27 mEq/L (ref 19–32)
Calcium: 9.2 mg/dL (ref 8.4–10.5)
Chloride: 104 mEq/L (ref 96–112)
Creatinine, Ser: 0.85 mg/dL (ref 0.40–1.50)
GFR: 96.18 mL/min (ref >60.00)
Glucose, Bld: 95 mg/dL (ref 70–99)
Potassium: 4.1 mEq/L (ref 3.5–5.1)
Sodium: 139 mEq/L (ref 135–145)

## 2022-06-11 LAB — HIGH SENSITIVITY CRP: CRP, High Sensitivity: 19.66 mg/L — ABNORMAL HIGH (ref 0.000–5.000)

## 2022-06-11 LAB — TSH: TSH: 1.88 u[IU]/mL (ref 0.35–5.50)

## 2022-06-11 MED ORDER — COLESTIPOL HCL 1 G PO TABS: 1.0000 g | ORAL_TABLET | Freq: Two times a day (BID) | ORAL | 3 refills | Status: DC

## 2022-06-11 NOTE — Progress Notes (Signed)
HISTORY OF PRESENT ILLNESS:  Cole [name] is a 57 y.o. male with a history of ileal Crohn's disease status post ileocecectomy in [facility] when he presented with suppurative appendicitis associated with phlegmon.  The pathology revealed changes consistent with Crohn's disease.  I saw the patient August 2015 for chronic diarrhea and the need for colonoscopy.  Colonoscopy was performed October 2015 and revealed an ulcerated and stenotic surgical anastomosis.  Biopsies were consistent with inflammatory bowel disease.  Subsequent small bowel follow-through revealed changes consistent with Crohn's disease involving the distalmost ileum for 5 cm.  Subsequent CT revealed some inflammation at the level of the anastomosis.  He has followed up suboptimally.  In August 2017 he opted for ongoing surveillance as opposed to more aggressive medical therapy.  In December 2018 he was seen for iron deficiency anemia with a hemoglobin of 9.7.  Upper endoscopy was performed December 2018.  This was essentially normal except for incidental esophageal ring.  Then underwent complete colonoscopy June 08, 2017 and was found to have stenosis and ulceration at the level of the anastomosis.  Unable to intubate the ileum.  He was placed on iron.  He responded well to iron therapy.  He was then seen June 2019 as he continued to decline maintenance medical therapy.  His last office visit was April 2023 after emergency room visit for right-sided abdominal pain.  He was found to have inflammation of the ileum on imaging.  Colonoscopy was performed September 10, 2021.  Significant ileal disease.  He was prescribed a course of prednisone for 6 weeks.  He was to follow-up in the office in 4 to 6 weeks, but did not.  He presents at this time.  Patient tells me that his pain resolved after treatment with prednisone.  He reports problems with diarrhea at least once per week.  Stools are often loose or soft.  He is denying  abdominal pain at this time.  He continues to smoke.  He tells me that he is interested in considering maintenance therapies for his active ileal Crohn's disease.  GI review of systems is otherwise negative.  Review of blood work from March 26, 2022 shows hemoglobin of 14.9.  Liver tests are normal except for ALT of 54.  Normal magnesium.  Normal renal function.  Normal hemoglobin A1c.  Last CT scan March 2023.  REVIEW OF SYSTEMS:  All non-GI ROS negative unless otherwise stated in the HPI except for arthritis  Past Medical History:  Diagnosis Date   ADD (attention deficit disorder)    Allergy    Appendicitis    Arthritis    ASHD (arteriosclerotic heart disease)    Cancer ([place])    Colitis    Crohn's disease ([place])    Eczema    Emphysema of lung ([place])    GERD (gastroesophageal reflux disease)    Hyperlipidemia    Hypertension    Left renal mass    Morbid obesity ([place])    Myocardial infarction ([place])    status post RCA stenting in [place] 2011   Positive for macroalbuminuria    Renal cell carcinoma, left ([place]) 08/22/2019   Dr. Risa Grill for left renal mass found during crohn's evaluation, had left partial nephrectomy 01/17/2015 and had + renal cell carcinoma     Past Surgical History:  Procedure Laterality Date   APPENDECTOMY     COLON SURGERY     CORONARY STENT PLACEMENT     x 1   ELBOW  SURGERY     ROBOTIC ASSITED PARTIAL NEPHRECTOMY Left 01/17/2015   Procedure: ROBOTIC ASSITED LAPAROSCOPIC LEFT  PARTIAL  NEPHRECTOMY;  Surgeon: Ardis Hughs, MD;  Location: [facility];  Service: Urology;  Laterality: Left;    Social History Cole [name]  reports that he has been smoking cigarettes. He has a 30.00 pack-year smoking history. He has never used smokeless tobacco. He reports current alcohol use. He reports current drug use. Drug: Marijuana.  family history includes Crohn's disease in his paternal aunt and paternal uncle; Diabetes in his maternal grandmother.  No Known  Allergies     PHYSICAL EXAMINATION: Vital signs: BP 138/82   Pulse 70   Ht '5\' 9"'$  (1.753 m)   Wt 234 lb (106.1 kg)   BMI 34.56 kg/m   Constitutional: generally well-appearing, no acute distress Psychiatric: alert and oriented x3, cooperative Eyes: extraocular movements intact, anicteric, conjunctiva pink Mouth: oral pharynx moist, no lesions Neck: supple no lymphadenopathy Cardiovascular: heart regular rate and rhythm, no murmur Lungs: clear to auscultation bilaterally Abdomen: soft, tenderness in the right lower quadrant without mass effect, nondistended, no obvious ascites, no peritoneal signs, normal bowel sounds, no organomegaly Rectal: Deferred until colonoscopy Extremities: no clubbing, cyanosis, or lower extremity edema bilaterally Skin: no lesions on visible extremities Neuro: No focal deficits.  Cranial nerves intact  ASSESSMENT:  1.  Complicated ileal Crohn's disease with phlegmon status post ileocecectomy elsewhere.  Currently with diarrhea.  Abdominal discomfort on physical exam. 2.  History of iron deficiency anemia secondary to active Crohn's disease.  Most recent hemoglobin was normal. 3.  Active ileal Crohn's disease on most recent colonoscopy April 2023. 4.  Suboptimal medical follow-up. 5.  Ongoing tobacco abuse   PLAN:  1.  The patient is interested in considering treatment of his active Crohn's disease and maintenance therapies.  Discussed multiple options.  We focused on immunomodulators and anti-TNF Biologics.  He is interested. 2.  Blood work today including comprehensive metabolic panel, CBC, C-reactive protein, TSH 3.  Additional blood work including QuantiFERON gold, hepatitis serologies, TPMT phenotype 4.  Provided information on Humira for his review 5.  Stop smoking 6.  Prescribe Colestid 1 g p.o. twice daily.  Medication side effects and effects reviewed. 7.  Schedule colonoscopy for reevaluation of his Crohn's disease as a baseline prior to  initiating combination therapy with Imuran and Humira. 8.  Office follow-up after all the above A total time of 60 minutes was spent preparing to see the patient, reviewing the myriad of data, obtaining comprehensive history, performing medically appropriate physical exam, counseling and educating the patient regarding the above listed issues, ordering medication, ordering blood work, ordering advanced lumbar, scheduling colonoscopy, and documenting clinical information in the health record

## 2022-06-11 NOTE — Patient Instructions (Addendum)
_______________________________________________________  If your blood pressure at your visit was 140/90 or greater, please contact your primary care physician to follow up on this.  _______________________________________________________  If you are age 58 or older, your body mass index should be between 23-30. Your Body mass index is 34.56 kg/m. If this is out of the aforementioned range listed, please consider follow up with your Primary Care Provider.  If you are age 37 or younger, your body mass index should be between 19-25. Your Body mass index is 34.56 kg/m. If this is out of the aformentioned range listed, please consider follow up with your Primary Care Provider.   ________________________________________________________  The [place] [facility] providers would like to encourage you to use [facility] to communicate with providers for non-urgent requests or questions.  Due to long hold times on the telephone, sending your provider a message by [facility] may be a faster and more efficient way to get a response.  Please allow 48 business hours for a response.  Please remember that this is for non-urgent requests.  _______________________________________________________  Dennis Bast have been scheduled for a colonoscopy. Please follow written instructions given to you at your visit today.  Please pick up your prep supplies at the pharmacy within the next 1-3 days. If you use inhalers (even only as needed), please bring them with you on the day of your procedure.  Your provider has requested that you go to the basement level for lab work before leaving today. Press "B" on the elevator. The lab is located at the first door on the left as you exit the elevator.  We have sent the following medications to your pharmacy for you to pick up at your convenience: Colestid

## 2022-06-15 LAB — QUANTIFERON-TB GOLD PLUS
QuantiFERON Mitogen Value: >10 IU/mL
QuantiFERON Nil Value: 0 IU/mL
QuantiFERON TB1 Ag Value: 0 IU/mL
QuantiFERON TB2 Ag Value: 0 IU/mL
QuantiFERON-TB Gold Plus: NEGATIVE

## 2022-06-17 LAB — HEPATITIS B SURFACE ANTIGEN: Hepatitis B Surface Ag: NONREACTIVE

## 2022-06-17 LAB — HEPATITIS B SURFACE ANTIBODY,QUALITATIVE: Hep B S Ab: NONREACTIVE

## 2022-06-17 LAB — HEPATITIS C ANTIBODY: Hepatitis C Ab: NONREACTIVE

## 2022-06-17 LAB — THIOPURINE METHYLTRANSFERASE (TPMT), RBC: Thiopurine Methyltransferase, RBC: 17 nmol/hr/mL RBC

## 2022-06-26 ENCOUNTER — Ambulatory Visit: Payer: Federal, State, Local not specified - PPO | Admitting: Nurse Practitioner

## 2022-06-26 ENCOUNTER — Encounter: Payer: Self-pay | Admitting: Nurse Practitioner

## 2022-06-26 ENCOUNTER — Other Ambulatory Visit: Payer: Self-pay

## 2022-06-26 VITALS — BP 130/80 | HR 73 | Temp 97.7°F | Ht 69.0 in | Wt 232.0 lb

## 2022-06-26 DIAGNOSIS — R0981 Nasal congestion: Secondary | ICD-10-CM | POA: Diagnosis not present

## 2022-06-26 DIAGNOSIS — Z1152 Encounter for screening for COVID-19: Secondary | ICD-10-CM

## 2022-06-26 DIAGNOSIS — R051 Acute cough: Secondary | ICD-10-CM

## 2022-06-26 DIAGNOSIS — J069 Acute upper respiratory infection, unspecified: Secondary | ICD-10-CM | POA: Diagnosis not present

## 2022-06-26 DIAGNOSIS — F17200 Nicotine dependence, unspecified, uncomplicated: Secondary | ICD-10-CM

## 2022-06-26 DIAGNOSIS — R062 Wheezing: Secondary | ICD-10-CM

## 2022-06-26 DIAGNOSIS — R6889 Other general symptoms and signs: Secondary | ICD-10-CM | POA: Diagnosis not present

## 2022-06-26 LAB — POC COVID19 BINAXNOW: SARS Coronavirus 2 Ag: NEGATIVE

## 2022-06-26 LAB — POCT INFLUENZA A/B
Influenza A, POC: NEGATIVE
Influenza B, POC: NEGATIVE

## 2022-06-26 MED ORDER — PROMETHAZINE-DM 6.25-15 MG/5ML PO SYRP: 5.0000 mL | ORAL_SOLUTION | Freq: Four times a day (QID) | ORAL | 0 refills | Status: DC | PRN

## 2022-06-26 MED ORDER — PREDNISONE 20 MG PO TABS: ORAL_TABLET | ORAL | 0 refills | Status: DC

## 2022-06-26 MED ORDER — IPRATROPIUM-ALBUTEROL 0.5-2.5 (3) MG/3ML IN SOLN: 3.0000 mL | Freq: Once | RESPIRATORY_TRACT | Status: AC

## 2022-06-26 NOTE — Patient Instructions (Signed)

## 2022-06-26 NOTE — Progress Notes (Signed)
Assessment and Plan:  Cole [name] was seen today for an episodic visit.  Diagnoses and all order for this visit:  Flu-like symptoms Negative  - POCT Influenza A/B  Encounter for screening for COVID-19 Negative  - POC COVID-19  Upper respiratory tract infection, unspecified type Stay well hydrated to keep mucus thin and productive  Start steroid taper Start cough syrup Duoneb administered - tolerated well  - predniSONE (DELTASONE) 20 MG tablet; Take 2 tabs (40 mg) for 3 days followed by 1 tab (20 mg) for 4 days.  Dispense: 10 tablet; Refill: 0 - promethazine-dextromethorphan (PROMETHAZINE-DM) 6.25-15 MG/5ML syrup; Take 5 mLs by mouth 4 (four) times daily as needed for cough.  Dispense: 240 mL; Refill: 0 - ipratropium-albuterol (DUONEB) 0.5-2.5 (3) MG/3ML nebulizer solution 3 mL  Acute cough  - promethazine-dextromethorphan (PROMETHAZINE-DM) 6.25-15 MG/5ML syrup; Take 5 mLs by mouth 4 (four) times daily as needed for cough.  Dispense: 240 mL; Refill: 0  Smoker Smoking cessation instruction/counseling given:  counseled patient on the dangers of tobacco use, advised patient to stop smoking, and reviewed strategies to maximize success  - promethazine-dextromethorphan (PROMETHAZINE-DM) 6.25-15 MG/5ML syrup; Take 5 mLs by mouth 4 (four) times daily as needed for cough.  Dispense: 240 mL; Refill: 0  Wheezing Continue inhalers Breztri and Albuterol. Smoking cessation discussed  - promethazine-dextromethorphan (PROMETHAZINE-DM) 6.25-15 MG/5ML syrup; Take 5 mLs by mouth 4 (four) times daily as needed for cough.  Dispense: 240 mL; Refill: 0  Nasal congestion Mucinex samples provided.  Crohn's  Continue to f/u with GI  Notify office for further evaluation and treatment, questions or concerns if s/s fail to improve. The risks and benefits of my recommendations, as well as other treatment options were discussed with the patient today. Questions were answered.  Further disposition  pending results of labs. Discussed med's effects and SE's.    Over 20 minutes of exam, counseling, chart review, and critical decision making was performed.   Future Appointments  Date Time Provider [place]  07/02/2022  9:00 AM Lorretta Harp, MD [facility] None  09/25/2022  9:30 AM Darrol Jump, NP [facility] None  03/26/2023 10:00 AM Shayda Kalka, Kenney Houseman, NP [facility] None    ------------------------------------------------------------------------------------------------------------------   HPI BP 130/80   Pulse 73   Temp 97.7 F (36.5 C)   Ht '5\' 9"'$  (1.753 m)   Wt 232 lb (105.2 kg)   SpO2 99%   BMI 34.26 kg/m   Patient complains of symptoms of a URI, possible sinusitis. Symptoms include congestion and cough described as productive. Onset of symptoms was 3 days ago, and has been unchanged since that time. Treatment to date: none.  His wife has Covid and his boss has the Flu.  Recently followed with Dr. [name] 06/11/22 for crohn's disease of ileum with complication.  Per Dr. Blanch Media notes he is s/p ileocecetomy in [facility], 2011 when he presented with suppurative appendicitis associated with phlegmon.  The pathology revealed changes consistend with Crohn's. Colonoscopy performed 02/2014 and revealed an ulcerated and stenotic surgical anastomosis.  Biopsies were consistent with inflammatory bowel disease.  Subsequent small bowel follow-thorugh revealed changes consistend with Crohn's. Involving distalmost ileum for 5 cm.  Subsequent CT revealed some inflammation at the level of the anastomosis.  He followed up suboptimally.  Left partial nephrectomy 01/17/2015 and had +renal cell carcinoma.  In 11/2015 he opted for ongoing surveillance as opposed to more aggressive medical theraly.  04/2017 he was seen for IDA with Hbg of 9.7.  Upper endoscopy was  performed 04/2017 and normal except for incidental esophageal ring.  He underwent a complete colonscopy 05/2017 and was found to have  stenosis and ulceration at the lvel of the anastomosis.  Unable to intubate the ileum.  He was placed on iron and responded well to herapy. He was seen 10/2017 and continued to decline maintenance medical therapy.  Last OV was 08/2021 after ER visit for right side abd pain.  He was found to have inflammation of the ileum.  Coloonoscopy owas performed 08/2021.  Significant ileal disease.  He was prescribed a course of prdnisone for 6 weeks.  He was to f/u in office 4-6 weeks after but did not.  His most recent f/u was 06/11/22.  He continues to report diarrhea x1 week.  He continues to smoke.  He is now interested in considering maintenece therapies for active ileal Chrohn's disease.  GI ROS was negative. Blood work was stable.  He previously followed with Dr. Risa Grill for left renal mass found during crohn's evaluation, had left partial nephrectomy 01/17/2015 and had + renal cell carcinoma.  Has not had recent appointment and states that he was cleared.    Past Medical History:  Diagnosis Date   ADD (attention deficit disorder)    Allergy    Appendicitis    Arthritis    ASHD (arteriosclerotic heart disease)    Cancer (HCC)    Colitis    Crohn's disease ([place])    Eczema    Emphysema of lung ([place])    GERD (gastroesophageal reflux disease)    Hyperlipidemia    Hypertension    Left renal mass    Morbid obesity ([place])    Myocardial infarction ([place])    status post RCA stenting in [place] 2011   Positive for macroalbuminuria    Renal cell carcinoma, left ([place]) 08/22/2019   Dr. Risa Grill for left renal mass found during crohn's evaluation, had left partial nephrectomy 01/17/2015 and had + renal cell carcinoma      No Known Allergies  Current Outpatient Medications on File Prior to Visit  Medication Sig   albuterol (VENTOLIN HFA) 108 (90 Base) MCG/ACT inhaler Inhale 1-2 puffs into the lungs every 4 (four) hours as needed for wheezing or shortness of breath.   amphetamine-dextroamphetamine  (ADDERALL) 20 MG tablet Take 1 tablet (20 mg total) by mouth 2 (two) times daily.   aspirin 81 MG chewable tablet Chew by mouth.   atorvastatin (LIPITOR) 80 MG tablet Take  1 tablet  Daily  for Cholesterol   Budeson-Glycopyrrol-Formoterol (BREZTRI AEROSPHERE) 160-9-4.8 MCG/ACT AERO Inhale 2 puffs into the lungs in the morning and at bedtime.   colestipol (COLESTID) 1 g tablet Take 1 tablet (1 g total) by mouth 2 (two) times daily.   Cyanocobalamin (VITAMIN B-12 PO) Take by mouth.   ferrous sulfate 325 (65 FE) MG tablet Take 1 tablet (325 mg total) by mouth 2 (two) times daily. Patient needs office visit for further refills   metoprolol tartrate (LOPRESSOR) 25 MG tablet Take 1 tablet (25 mg total) by mouth 2 (two) times daily.   Multiple Vitamins-Minerals (MULTIVITAMIN WITH MINERALS) tablet Take 1 tablet by mouth daily.   Multiple Vitamins-Minerals (ZINC PO) Take by mouth.   azithromycin (ZITHROMAX) 250 MG tablet Take 2 tablets (500 mg) on  Day 1,  followed by 1 tablet (250 mg) once daily on Days 2 through 5.   diclofenac Sodium (VOLTAREN) 1 % GEL Apply 4 g topically 4 (four) times daily.   ondansetron (ZOFRAN-ODT)  4 MG disintegrating tablet Take 1 tablet (4 mg total) by mouth every 8 (eight) hours as needed for nausea or vomiting.   No current facility-administered medications on file prior to visit.    ROS: all negative except what is noted in the HPI.   Physical Exam:  BP 130/80   Pulse 73   Temp 97.7 F (36.5 C)   Ht '5\' 9"'$  (1.753 m)   Wt 232 lb (105.2 kg)   SpO2 99%   BMI 34.26 kg/m   General Appearance: NAD.  Awake, conversant and cooperative. Eyes: PERRLA, EOMs intact.  Sclera white.  Conjunctiva without erythema. Sinuses: No frontal/maxillary tenderness.  No nasal discharge. Nares patent.  ENT/Mouth: Ext aud canals clear.  Bilateral TMs w/DOL and without erythema or bulging. Hearing intact.  Posterior pharynx without swelling or exudate.  Tonsils without swelling or erythema.   Neck: Supple.  No masses, nodules or thyromegaly. Respiratory: Effort is regular with non-labored breathing. Breath sounds are equal bilaterally with scattered wheezing or stridor.  Cardio: RRR with no MRGs. Brisk peripheral pulses without edema.  Abdomen: Active BS in all four quadrants.  Soft and non-tender without guarding, rebound tenderness, hernias or masses. Lymphatics: Non tender without lymphadenopathy.  Musculoskeletal: Full ROM, 5/5 strength, normal ambulation.  No clubbing or cyanosis. Skin: Appropriate color for ethnicity. Warm without rashes, lesions, ecchymosis, ulcers.  Neuro: CN II-XII grossly normal. Normal muscle tone without cerebellar symptoms and intact sensation.   Psych: AO X 3,  appropriate mood and affect, insight and judgment.     Darrol Jump, NP 11:57 AM [facility]

## 2022-06-30 ENCOUNTER — Encounter: Payer: Self-pay | Admitting: Nurse Practitioner

## 2022-06-30 MED ORDER — BENZONATATE 100 MG PO CAPS: 100.0000 mg | ORAL_CAPSULE | Freq: Three times a day (TID) | ORAL | 1 refills | Status: AC | PRN

## 2022-06-30 MED ORDER — AZITHROMYCIN 500 MG PO TABS: 500.0000 mg | ORAL_TABLET | Freq: Every day | ORAL | 0 refills | Status: AC

## 2022-07-02 ENCOUNTER — Ambulatory Visit: Payer: Federal, State, Local not specified - PPO | Admitting: Cardiovascular Disease

## 2022-07-16 ENCOUNTER — Ambulatory Visit: Payer: Federal, State, Local not specified - PPO | Attending: Cardiovascular Disease | Admitting: Cardiovascular Disease

## 2022-07-16 ENCOUNTER — Encounter: Payer: Self-pay | Admitting: Cardiovascular Disease

## 2022-07-16 VITALS — BP 128/84 | HR 70 | Ht 69.0 in | Wt 234.0 lb

## 2022-07-16 DIAGNOSIS — I2510 Atherosclerotic heart disease of native coronary artery without angina pectoris: Secondary | ICD-10-CM | POA: Diagnosis not present

## 2022-07-16 DIAGNOSIS — E782 Mixed hyperlipidemia: Secondary | ICD-10-CM | POA: Diagnosis not present

## 2022-07-16 DIAGNOSIS — I10 Essential (primary) hypertension: Secondary | ICD-10-CM | POA: Diagnosis not present

## 2022-07-16 DIAGNOSIS — F17200 Nicotine dependence, unspecified, uncomplicated: Secondary | ICD-10-CM

## 2022-07-16 MED ORDER — EZETIMIBE 10 MG PO TABS: 10.0000 mg | ORAL_TABLET | Freq: Every day | ORAL | 3 refills | Status: DC

## 2022-07-16 NOTE — Assessment & Plan Note (Signed)
History of ongoing tobacco abuse of close to 60-pack-year smoking 1 pack/day recalcitrant to risk factor modification.  We talked about the importance of smoking cessation.

## 2022-07-16 NOTE — Progress Notes (Signed)
07/16/2022 Cole [name]   05/16/1966  [id number]  Primary Physician Unk Pinto, MD Primary Cardiologist: Lorretta Harp MD FACP, [place], [place], [place]  HPI:  Cole [name] is a 57 y.o.  moderately overweight married Caucasian male father of 2 children, grandfather to 3 grandchildren, who I last saw in the office 12/19/2014.  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  He was referred for preoperative clearance before elective partial robotic left nephrectomy scheduled to be performed by Dr. Louis Meckel. His primary care physician is Dr. Melford Aase His cardiac risk factor profile is notable for 35-50-pack-years of tobacco abuse continue to smoke one pack a day, treated hypertension, hyperlipidemia. There is no family history. He did have a myocardial infarction in 2012 and was treated at [facility] by Dr. Darral Dash. He had a DES placed in his RCA radially. He currently denies chest pain or shortness of breath. He is scheduled to have an elective robotic left partial nephrectomy by Dr. Louis Meckel at [facility] and was referred here for preoperative clearance.  Since I saw him 8 years ago he continues to do well.  He does however continue to smoke a pack a day.  He denies chest pain but does have dyspnea on exertion most likely related to 60 pack years of tobacco abuse and COPD.  He did have a chest CT performed for cancer screening 8/9 that showed coronary calcification.  Current Meds  Medication Sig   albuterol (VENTOLIN HFA) 108 (90 Base) MCG/ACT inhaler Inhale 1-2 puffs into the lungs every 4 (four) hours as needed for wheezing or shortness of breath.   amphetamine-dextroamphetamine (ADDERALL) 20 MG tablet Take 1 tablet (20 mg total) by mouth 2 (two) times daily.   aspirin 81 MG chewable tablet Chew by mouth.   atorvastatin (LIPITOR) 80 MG tablet Take  1 tablet  Daily  for Cholesterol   benzonatate (TESSALON PERLES) 100 MG capsule Take 1 capsule (100  mg total) by mouth 3 (three) times daily as needed for cough.   Budeson-Glycopyrrol-Formoterol (BREZTRI AEROSPHERE) 160-9-4.8 MCG/ACT AERO Inhale 2 puffs into the lungs in the morning and at bedtime.   colestipol (COLESTID) 1 g tablet Take 1 tablet (1 g total) by mouth 2 (two) times daily.   Cyanocobalamin (VITAMIN B-12 PO) Take by mouth.   ferrous sulfate 325 (65 FE) MG tablet Take 1 tablet (325 mg total) by mouth 2 (two) times daily. Patient needs office visit for further refills   metoprolol tartrate (LOPRESSOR) 25 MG tablet Take 1 tablet (25 mg total) by mouth 2 (two) times daily.   Multiple Vitamins-Minerals (MULTIVITAMIN WITH MINERALS) tablet Take 1 tablet by mouth daily.   Multiple Vitamins-Minerals (ZINC PO) Take by mouth.   Current Facility-Administered Medications for the 07/16/22 encounter (Office Visit) with Lorretta Harp, MD  Medication   ipratropium-albuterol (DUONEB) 0.5-2.5 (3) MG/3ML nebulizer solution 3 mL     Allergies  Allergen Reactions   Bee Venom Swelling    Social History   Socioeconomic History   Marital status: Married    Spouse name: Not on file   Number of children: 2   Years of education: Not on file   Highest education level: Not on file  Occupational History   Occupation: Truck Education administrator: [employer]  Tobacco Use   Smoking status: Every Day    Packs/day: 1.00    Years: 30.00    Total pack years: 30.00  Types: Cigarettes   Smokeless tobacco: Never  Vaping Use   Vaping Use: Never used  Substance and Sexual Activity   Alcohol use: Yes    Comment: Occasional Beer   Drug use: Yes    Types: Marijuana    Comment: used last 1 day ago, h/o cocaine use 20 years ago.   Sexual activity: Yes  Other Topics Concern   Not on file  Social History Narrative   Not on file   Social Determinants of Health   Financial Resource Strain: Not on file  Food Insecurity: Not on file  Transportation Needs: Not on file  Physical  Activity: Not on file  Stress: Not on file  Social Connections: Not on file  Intimate Partner Violence: Not on file     Review of Systems: General: negative for chills, fever, night sweats or weight changes.  Cardiovascular: negative for chest pain, dyspnea on exertion, edema, orthopnea, palpitations, paroxysmal nocturnal dyspnea or shortness of breath Dermatological: negative for rash Respiratory: negative for cough or wheezing Urologic: negative for hematuria Abdominal: negative for nausea, vomiting, diarrhea, bright red blood per rectum, melena, or hematemesis Neurologic: negative for visual changes, syncope, or dizziness All other systems reviewed and are otherwise negative except as noted above.    Blood pressure 128/84, pulse 70, height '5\' 9"'$  (1.753 m), weight 234 lb (106.1 kg), SpO2 95 %.  General appearance: alert and icteric Neck: no adenopathy, no carotid bruit, no JVD, supple, symmetrical, trachea midline, and thyroid not enlarged, symmetric, no tenderness/mass/nodules Lungs: clear to auscultation bilaterally Heart: regular rate and rhythm, S1, S2 normal, no murmur, click, rub or gallop Extremities: extremities normal, atraumatic, no cyanosis or edema Pulses: 2+ and symmetric Skin: Skin color, texture, turgor normal. No rashes or lesions Neurologic: Grossly normal  EKG sinus rhythm at 70 with incomplete right bundle branch block.  I personally reviewed this EKG.  ASSESSMENT AND PLAN:   Hyperlipidemia History of hyperlipidemia on atorvastatin 80 mg a day with a lipid profile performed 11 subsequently is revealing total cholesterol 164, LDL of 87 and HDL 41, not at goal for secondary prevention.  I am going to add Zetia 10 mg a day and we will recheck a lipid liver profile in 3 months, LDL l goal of less than 70.  Hypertension History of essential hypertension blood pressure measured today 120/84.  He is on metoprolol.  Smoker History of ongoing tobacco abuse of close  to 60-pack-year smoking 1 pack/day recalcitrant to risk factor modification.  We talked about the importance of smoking cessation.  Coronary artery disease History of ED status post myocardial infarction back in 2012 with PCI and drug-eluting stenting of his RCA performed radially at [facility] hospital by Dr. Darral Dash.  He denies chest pain.  He did have a 2D echocardiogram performed 12/27/2014 revealing normal LV function without valvular abnormalities.  He is otherwise asymptomatic.     Lorretta Harp MD FACP,FACC,FAHA, [facility] 07/16/2022 9:55 AM

## 2022-07-16 NOTE — Patient Instructions (Signed)
Medication Instructions:  Your physician has recommended you make the following change in your medication:   -Start taking ezetimibe (zetia) '10mg'$  once daily.  *If you need a refill on your cardiac medications before your next appointment, please call your pharmacy*   Lab Work: Your physician recommends that you return for lab work in: 3 months for FASTING lipid/liver panel  If you have labs (blood work) drawn today and your tests are completely normal, you will receive your results only by: [place] (if you have MyChart) OR A paper copy in the mail If you have any lab test that is abnormal or we need to change your treatment, we will call you to review the results.   Testing/Procedures: Dr. Gwenlyn Found has ordered a CT coronary calcium score.   Test locations:  [facility]   This is $99 out of pocket.   Coronary CalciumScan A coronary calcium scan is an imaging test used to look for deposits of calcium and other fatty materials (plaques) in the inner lining of the blood vessels of the heart (coronary arteries). These deposits of calcium and plaques can partly clog and narrow the coronary arteries without producing any symptoms or warning signs. This puts a person at risk for a heart attack. This test can detect these deposits before symptoms develop. Tell a health care provider about: Any allergies you have. All medicines you are taking, including vitamins, herbs, eye drops, creams, and over-the-counter medicines. Any problems you or family members have had with anesthetic medicines. Any blood disorders you have. Any surgeries you have had. Any medical conditions you have. Whether you are pregnant or may be pregnant. What are the risks? Generally, this is a safe procedure. However, problems may occur, including: Harm to a pregnant woman and her unborn baby. This test involves the use of radiation. Radiation exposure can be dangerous to a pregnant  woman and her unborn baby. If you are pregnant, you generally should not have this procedure done. Slight increase in the risk of cancer. This is because of the radiation involved in the test. What happens before the procedure? No preparation is needed for this procedure. What happens during the procedure? You will undress and remove any jewelry around your neck or chest. You will put on a hospital gown. Sticky electrodes will be placed on your chest. The electrodes will be connected to an electrocardiogram (ECG) machine to record a tracing of the electrical activity of your heart. A CT scanner will take pictures of your heart. During this time, you will be asked to lie still and hold your breath for 2-3 seconds while a picture of your heart is being taken. The procedure may vary among health care providers and hospitals. What happens after the procedure? You can get dressed. You can return to your normal activities. It is up to you to get the results of your test. Ask your health care provider, or the department that is doing the test, when your results will be ready. Summary A coronary calcium scan is an imaging test used to look for deposits of calcium and other fatty materials (plaques) in the inner lining of the blood vessels of the heart (coronary arteries). Generally, this is a safe procedure. Tell your health care provider if you are pregnant or may be pregnant. No preparation is needed for this procedure. A CT scanner will take pictures of your heart. You can return to your normal activities after the scan is done. This information  is not intended to replace advice given to you by your health care provider. Make sure you discuss any questions you have with your health care provider. Document Released: 11/01/2007 Document Revised: 03/24/2016 Document Reviewed: 03/24/2016 Elsevier Interactive Patient Education  2017 [place]: At [facility], you and your  health needs are our priority.  As part of our continuing mission to provide you with exceptional heart care, we have created designated Provider Care Teams.  These Care Teams include your primary Cardiologist (physician) and Advanced Practice Providers (APPs -  Physician Assistants and Nurse Practitioners) who all work together to provide you with the care you need, when you need it.  We recommend signing up for the patient portal called "MyChart".  Sign up information is provided on this After Visit Summary.  MyChart is used to connect with patients for Virtual Visits (Telemedicine).  Patients are able to view lab/test results, encounter notes, upcoming appointments, etc.  Non-urgent messages can be sent to your provider as well.   To learn more about what you can do with MyChart, go to [email or web address].    Your next appointment:   12 month(s)  Provider:   Quay Burow, MD

## 2022-07-16 NOTE — Assessment & Plan Note (Signed)
History of ED status post myocardial infarction back in 2012 with PCI and drug-eluting stenting of his RCA performed radially at [facility] hospital by Dr. Darral Dash.  He denies chest pain.  He did have a 2D echocardiogram performed 12/27/2014 revealing normal LV function without valvular abnormalities.  He is otherwise asymptomatic.

## 2022-07-16 NOTE — Assessment & Plan Note (Signed)
History of essential hypertension blood pressure measured today 120/84.  He is on metoprolol.

## 2022-07-16 NOTE — Assessment & Plan Note (Signed)
History of hyperlipidemia on atorvastatin 80 mg a day with a lipid profile performed 11 subsequently is revealing total cholesterol 164, LDL of 87 and HDL 41, not at goal for secondary prevention.  I am going to add Zetia 10 mg a day and we will recheck a lipid liver profile in 3 months, LDL l goal of less than 70.

## 2022-07-31 ENCOUNTER — Encounter: Payer: Self-pay | Admitting: Internal Medicine

## 2022-07-31 ENCOUNTER — Ambulatory Visit (AMBULATORY_SURGERY_CENTER): Payer: Federal, State, Local not specified - PPO | Admitting: Internal Medicine

## 2022-07-31 VITALS — BP 116/73 | HR 71 | Temp 98.9°F | Resp 15 | Ht 69.0 in | Wt 234.0 lb

## 2022-07-31 DIAGNOSIS — K50019 Crohn's disease of small intestine with unspecified complications: Secondary | ICD-10-CM

## 2022-07-31 DIAGNOSIS — K633 Ulcer of intestine: Secondary | ICD-10-CM | POA: Diagnosis not present

## 2022-07-31 MED ORDER — SODIUM CHLORIDE 0.9 % IV SOLN: 500.0000 mL | Freq: Once | INTRAVENOUS | Status: DC

## 2022-07-31 NOTE — Progress Notes (Deleted)
Pt's states no medical or surgical changes since previsit or office visit. 

## 2022-07-31 NOTE — Progress Notes (Signed)
Pt awake, alert and oriented. VSS. Airway intact. SBAR complete to RN. All questions answered.   

## 2022-07-31 NOTE — Progress Notes (Signed)
HISTORY OF PRESENT ILLNESS:   Cole [name] is a 57 y.o. male with a history of ileal Crohn's disease status post ileocecectomy in [facility] [place] when he presented with suppurative appendicitis associated with phlegmon.  The pathology revealed changes consistent with Crohn's disease.  I saw the patient August 2015 for chronic diarrhea and the need for colonoscopy.  Colonoscopy was performed October 2015 and revealed an ulcerated and stenotic surgical anastomosis.  Biopsies were consistent with inflammatory bowel disease.  Subsequent small bowel follow-through revealed changes consistent with Crohn's disease involving the distalmost ileum for 5 cm.  Subsequent CT revealed some inflammation at the level of the anastomosis.  He has followed up suboptimally.  In August 2017 he opted for ongoing surveillance as opposed to more aggressive medical therapy.  In December 2018 he was seen for iron deficiency anemia with a hemoglobin of 9.7.  Upper endoscopy was performed December 2018.  This was essentially normal except for incidental esophageal ring.  Then underwent complete colonoscopy June 08, 2017 and was found to have stenosis and ulceration at the level of the anastomosis.  Unable to intubate the ileum.  He was placed on iron.  He responded well to iron therapy.  He was then seen June 2019 as he continued to decline maintenance medical therapy.  His last office visit was April 2023 after emergency room visit for right-sided abdominal pain.  He was found to have inflammation of the ileum on imaging.  Colonoscopy was performed September 10, 2021.  Significant ileal disease.  He was prescribed a course of prednisone for 6 weeks.  He was to follow-up in the office in 4 to 6 weeks, but did not.  He presents at this time.   Patient tells me that his pain resolved after treatment with prednisone.  He reports problems with diarrhea at least once per week.  Stools are often loose or soft.  He is denying  abdominal pain at this time.  He continues to smoke.  He tells me that he is interested in considering maintenance therapies for his active ileal Crohn's disease.  GI review of systems is otherwise negative.  Review of blood work from March 26, 2022 shows hemoglobin of 14.9.  Liver tests are normal except for ALT of 54.  Normal magnesium.  Normal renal function.  Normal hemoglobin A1c.  Last CT scan March 2023.   REVIEW OF SYSTEMS:   All non-GI ROS negative unless otherwise stated in the HPI except for arthritis       Past Medical History:  Diagnosis Date   ADD (attention deficit disorder)     Allergy     Appendicitis     Arthritis     ASHD (arteriosclerotic heart disease)     Cancer ([place])     Colitis     Crohn's disease ([place])     Eczema     Emphysema of lung ([place])     GERD (gastroesophageal reflux disease)     Hyperlipidemia     Hypertension     Left renal mass     Morbid obesity ([place])     Myocardial infarction [facility]      status post RCA stenting in [place] 2011   Positive for macroalbuminuria     Renal cell carcinoma, left ([place]) 08/22/2019    Dr. Risa Grill for left renal mass found during crohn's evaluation, had left partial nephrectomy 01/17/2015 and had + renal cell carcinoma  Past Surgical History:  Procedure Laterality Date   APPENDECTOMY       COLON SURGERY       CORONARY STENT PLACEMENT        x 1   ELBOW SURGERY       ROBOTIC ASSITED PARTIAL NEPHRECTOMY Left 01/17/2015    Procedure: ROBOTIC ASSITED LAPAROSCOPIC LEFT  PARTIAL  NEPHRECTOMY;  Surgeon: Ardis Hughs, MD;  Location: [facility];  Service: Urology;  Laterality: Left;      Social History Jakaden Buice  reports that he has been smoking cigarettes. He has a 30.00 pack-year smoking history. He has never used smokeless tobacco. He reports current alcohol use. He reports current drug use. Drug: Marijuana.   family history includes Crohn's disease in his paternal aunt and paternal uncle; Diabetes  in his maternal grandmother.   No Known Allergies       PHYSICAL EXAMINATION: Vital signs: BP 138/82   Pulse 70   Ht '5\' 9"'$  (1.753 m)   Wt 234 lb (106.1 kg)   BMI 34.56 kg/m   Constitutional: generally well-appearing, no acute distress Psychiatric: alert and oriented x3, cooperative Eyes: extraocular movements intact, anicteric, conjunctiva pink Mouth: oral pharynx moist, no lesions Neck: supple no lymphadenopathy Cardiovascular: heart regular rate and rhythm, no murmur Lungs: clear to auscultation bilaterally Abdomen: soft, tenderness in the right lower quadrant without mass effect, nondistended, no obvious ascites, no peritoneal signs, normal bowel sounds, no organomegaly Rectal: Deferred until colonoscopy Extremities: no clubbing, cyanosis, or lower extremity edema bilaterally Skin: no lesions on visible extremities Neuro: No focal deficits.  Cranial nerves intact   ASSESSMENT:   1.  Complicated ileal Crohn's disease with phlegmon status post ileocecectomy elsewhere.  Currently with diarrhea.  Abdominal discomfort on physical exam. 2.  History of iron deficiency anemia secondary to active Crohn's disease.  Most recent hemoglobin was normal. 3.  Active ileal Crohn's disease on most recent colonoscopy April 2023. 4.  Suboptimal medical follow-up. 5.  Ongoing tobacco abuse     PLAN:   1.  The patient is interested in considering treatment of his active Crohn's disease and maintenance therapies.  Discussed multiple options.  We focused on immunomodulators and anti-TNF Biologics.  He is interested. 2.  Blood work today including comprehensive metabolic panel, CBC, C-reactive protein, TSH 3.  Additional blood work including QuantiFERON gold, hepatitis serologies, TPMT phenotype 4.  Provided information on Humira for his review 5.  Stop smoking 6.  Prescribe Colestid 1 g p.o. twice daily.  Medication side effects and effects reviewed. 7.  Schedule colonoscopy for reevaluation of  his Crohn's disease as a baseline prior to initiating combination therapy with Imuran and Humira. 8.  Office follow-up after all the above   Recent H&P as above.  No interval change.  Now for colonoscopy

## 2022-07-31 NOTE — Patient Instructions (Addendum)
-   Resume previous diet. - Continue present medications. - Await pathology results. - Follow-up in the office with Dr. [name] in a few weeks to review the results and discuss treatment plans moving forward.  YOU HAD AN ENDOSCOPIC PROCEDURE TODAY AT [facility]:   Refer to the procedure report that was given to you for any specific questions about what was found during the examination.  If the procedure report does not answer your questions, please call your gastroenterologist to clarify.  If you requested that your care partner not be given the details of your procedure findings, then the procedure report has been included in a sealed envelope for you to review at your convenience later.  YOU SHOULD EXPECT: Some feelings of bloating in the abdomen. Passage of more gas than usual.  Walking can help get rid of the air that was put into your GI tract during the procedure and reduce the bloating. If you had a lower endoscopy (such as a colonoscopy or flexible sigmoidoscopy) you may notice spotting of blood in your stool or on the toilet paper. If you underwent a bowel prep for your procedure, you may not have a normal bowel movement for a few days.  Please Note:  You might notice some irritation and congestion in your nose or some drainage.  This is from the oxygen used during your procedure.  There is no need for concern and it should clear up in a day or so.  SYMPTOMS TO REPORT IMMEDIATELY:  Following lower endoscopy (colonoscopy or flexible sigmoidoscopy):  Excessive amounts of blood in the stool  Significant tenderness or worsening of abdominal pains  Swelling of the abdomen that is new, acute  Fever of 100F or higher  For urgent or emergent issues, a gastroenterologist can be reached at any hour by calling [phone]. Do not use MyChart messaging for urgent concerns.    DIET:  We do recommend a small meal at first, but then you may proceed to your regular diet.  Drink  plenty of fluids but you should avoid alcoholic beverages for 24 hours.  ACTIVITY:  You should plan to take it easy for the rest of today and you should NOT DRIVE or use heavy machinery until tomorrow (because of the sedation medicines used during the test).    FOLLOW UP: Our staff will call the number listed on your records the next business day following your procedure.  We will call around 7:15- 8:00 am to check on you and address any questions or concerns that you may have regarding the information given to you following your procedure. If we do not reach you, we will leave a message.     If any biopsies were taken you will be contacted by phone or by letter within the next 1-3 weeks.  Please call us at [phone] if you have not heard about the biopsies in 3 weeks.    SIGNATURES/CONFIDENTIALITY: You and/or your care partner have signed paperwork which will be entered into your electronic medical record.  These signatures attest to the fact that that the information above on your After Visit Summary has been reviewed and is understood.  Full responsibility of the confidentiality of this discharge information lies with you and/or your care-partner.

## 2022-07-31 NOTE — Op Note (Signed)
[place] Patient Name: Cole [name] Procedure Date: 07/31/2022 8:49 AM MRN: [id number] Endoscopist: Docia Chuck. Cole [name] , MD, [id number] Age: 57 Referring MD:  Date of Birth: [birth year] Gender: Male Account #: [id number] Procedure:                Colonoscopy with biopsies Indications:              High risk colon cancer surveillance: Crohn's                            disease. Evaluation prior to initiating biologic                            therapy Medicines:                Monitored Anesthesia Care Procedure:                Pre-Anesthesia Assessment:                           - Prior to the procedure, a History and Physical                            was performed, and patient medications and                            allergies were reviewed. The patient's tolerance of                            previous anesthesia was also reviewed. The risks                            and benefits of the procedure and the sedation                            options and risks were discussed with the patient.                            All questions were answered, and informed consent                            was obtained. Prior Anticoagulants: The patient has                            taken no anticoagulant or antiplatelet agents. ASA                            Grade Assessment: II - A patient with mild systemic                            disease. After reviewing the risks and benefits,                            the patient was deemed in satisfactory condition to  undergo the procedure.                           After obtaining informed consent, the colonoscope                            was passed under direct vision. Throughout the                            procedure, the patient's blood pressure, pulse, and                            oxygen saturations were monitored continuously. The                            Olympus CF-[id number] SN [id number] was introduced  through                            the anus and advanced to the the ileocolonic                            anastomosis. The terminal ileum and the rectum were                            photographed. The quality of the bowel preparation                            was excellent. The colonoscopy was performed                            without difficulty. The patient tolerated the                            procedure well. The bowel preparation used was                            SUPREP via split dose instruction. Scope In: 9:08:58 AM Scope Out: 9:23:14 AM Scope Withdrawal Time: 0 hours 12 minutes 28 seconds  Total Procedure Duration: 0 hours 14 minutes 16 seconds  Findings:                 The scope was advanced to the ileocolonic                            anastomosis. The distal ileum contained multiple                            ulcers as well as luminal narrowing. No bleeding                            was present. Multiple biopsies were taken with a                            cold forceps for histology.  The exam was otherwise without abnormality on                            direct and retroflexion views. No colonic Crohn's. Complications:            No immediate complications. Estimated blood loss:                            None. Estimated Blood Loss:     Estimated blood loss: none. Impression:               - Ileal Crohn's with multiple ulcers in the distal                            ileum and luminal narrowing. Biopsied.                           - The examination was otherwise normal on direct                            and retroflexion views, status post ileocecectomy. Recommendation:           - Repeat colonoscopy (date not yet determined) for                            surveillance.                           - Patient has a contact number available for                            emergencies. The signs and symptoms of potential                             delayed complications were discussed with the                            patient. Return to normal activities tomorrow.                            Written discharge instructions were provided to the                            patient.                           - Resume previous diet.                           - Continue present medications.                           - Await pathology results.                           - Follow-up in the office with Dr. Henrene [name] in a few  weeks to review the results and discuss treatment                            plans moving forward. Docia Chuck. Cole Pastor, MD 07/31/2022 9:32:47 AM This report has been signed electronically.

## 2022-07-31 NOTE — Progress Notes (Signed)
On pre-procedure assessment pt's left pupil noted to be approximately 1 mm larger than his right pupil. Both are equal round and reactive. Pt denies hx different pupil sizes of stroke, exposure to atropine and denies head or eye trauma. Pt AAOx3. Will monitor.

## 2022-07-31 NOTE — Progress Notes (Signed)
Called to room to assist during endoscopic procedure.  Patient ID and intended procedure confirmed with present staff. Received instructions for my participation in the procedure from the performing physician.  

## 2022-08-01 ENCOUNTER — Telehealth: Payer: Self-pay | Admitting: *Deleted

## 2022-08-01 NOTE — Telephone Encounter (Signed)
  Follow up Call-     07/31/2022    7:56 AM 09/10/2021   12:41 PM  Call back number  Post procedure Call Back phone  # [phone]  Permission to leave phone message Yes Yes     Patient questions:  Do you have a fever, pain , or abdominal swelling? No. Pain Score  0 *  Have you tolerated food without any problems? Yes.    Have you been able to return to your normal activities? Yes.    Do you have any questions about your discharge instructions: Diet   No. Medications  No. Follow up visit  No.  Do you have questions or concerns about your Care? No.  Actions: * If pain score is 4 or above: No action needed, pain <4.

## 2022-08-04 ENCOUNTER — Encounter: Payer: Self-pay | Admitting: Internal Medicine

## 2022-08-18 ENCOUNTER — Ambulatory Visit (HOSPITAL_BASED_OUTPATIENT_CLINIC_OR_DEPARTMENT_OTHER)
Admission: RE | Admit: 2022-08-18 | Discharge: 2022-08-18 | Disposition: A | Discharge status: Home / Self Care | Payer: Federal, State, Local not specified - PPO | Source: Ambulatory Visit | Attending: Cardiovascular Disease | Admitting: Cardiovascular Disease

## 2022-08-18 ENCOUNTER — Telehealth: Payer: Self-pay | Admitting: Cardiovascular Disease

## 2022-08-18 ENCOUNTER — Encounter (HOSPITAL_BASED_OUTPATIENT_CLINIC_OR_DEPARTMENT_OTHER): Payer: Self-pay

## 2022-08-18 DIAGNOSIS — F17200 Nicotine dependence, unspecified, uncomplicated: Secondary | ICD-10-CM

## 2022-08-18 DIAGNOSIS — I2510 Atherosclerotic heart disease of native coronary artery without angina pectoris: Secondary | ICD-10-CM

## 2022-08-18 DIAGNOSIS — I10 Essential (primary) hypertension: Secondary | ICD-10-CM

## 2022-08-18 DIAGNOSIS — E782 Mixed hyperlipidemia: Secondary | ICD-10-CM

## 2022-08-18 NOTE — Telephone Encounter (Signed)
Spoke with pt regarding coronary calcium score. Pt was unable to complete this scan d/t pt having a stent in his RCA. Pt is not currently having any chest pain or pressure. Will send over to Dr. Gwenlyn Found to see if any additional testing is needed. Pt verbalizes understanding.

## 2022-08-18 NOTE — Telephone Encounter (Signed)
Pt called in stating he was unable to have CT cardiac scoring test done because he has a stent. Pt wants to know what he should do now.

## 2022-08-29 ENCOUNTER — Telehealth: Payer: Self-pay | Admitting: Nurse Practitioner

## 2022-08-29 ENCOUNTER — Other Ambulatory Visit: Payer: Self-pay | Admitting: Nurse Practitioner

## 2022-08-29 DIAGNOSIS — F988 Other specified behavioral and emotional disorders with onset usually occurring in childhood and adolescence: Secondary | ICD-10-CM

## 2022-08-29 DIAGNOSIS — J439 Emphysema, unspecified: Secondary | ICD-10-CM

## 2022-08-29 MED ORDER — AMPHETAMINE-DEXTROAMPHETAMINE 20 MG PO TABS: 20.0000 mg | ORAL_TABLET | Freq: Two times a day (BID) | ORAL | 0 refills | Status: DC

## 2022-08-29 MED ORDER — BREZTRI AEROSPHERE 160-9-4.8 MCG/ACT IN AERO: 2.0000 | INHALATION_SPRAY | Freq: Two times a day (BID) | RESPIRATORY_TRACT | 3 refills | Status: DC

## 2022-08-29 NOTE — Telephone Encounter (Signed)
Pt is requesting a refill on Adderall and Breztri inhaler to go to [facility]

## 2022-09-24 NOTE — Telephone Encounter (Signed)
Cole Gess, MD  You    No additional testing required

## 2022-09-25 ENCOUNTER — Ambulatory Visit: Payer: Federal, State, Local not specified - PPO | Admitting: Nurse Practitioner

## 2022-09-30 ENCOUNTER — Encounter: Payer: Self-pay | Admitting: Nurse Practitioner

## 2022-09-30 ENCOUNTER — Ambulatory Visit (INDEPENDENT_AMBULATORY_CARE_PROVIDER_SITE_OTHER): Payer: Federal, State, Local not specified - PPO | Admitting: Nurse Practitioner

## 2022-09-30 VITALS — BP 130/80 | HR 72 | Temp 97.8°F | Ht 69.0 in | Wt 239.0 lb

## 2022-09-30 DIAGNOSIS — I10 Essential (primary) hypertension: Secondary | ICD-10-CM | POA: Diagnosis not present

## 2022-09-30 DIAGNOSIS — R7309 Other abnormal glucose: Secondary | ICD-10-CM

## 2022-09-30 DIAGNOSIS — E782 Mixed hyperlipidemia: Secondary | ICD-10-CM

## 2022-09-30 DIAGNOSIS — I2510 Atherosclerotic heart disease of native coronary artery without angina pectoris: Secondary | ICD-10-CM

## 2022-09-30 DIAGNOSIS — F988 Other specified behavioral and emotional disorders with onset usually occurring in childhood and adolescence: Secondary | ICD-10-CM

## 2022-09-30 DIAGNOSIS — S0501XA Injury of conjunctiva and corneal abrasion without foreign body, right eye, initial encounter: Secondary | ICD-10-CM

## 2022-09-30 DIAGNOSIS — E559 Vitamin D deficiency, unspecified: Secondary | ICD-10-CM | POA: Diagnosis not present

## 2022-09-30 DIAGNOSIS — I700 Atherosclerosis of aorta: Secondary | ICD-10-CM

## 2022-09-30 DIAGNOSIS — D3502 Benign neoplasm of left adrenal gland: Secondary | ICD-10-CM

## 2022-09-30 DIAGNOSIS — F17200 Nicotine dependence, unspecified, uncomplicated: Secondary | ICD-10-CM

## 2022-09-30 DIAGNOSIS — K50019 Crohn's disease of small intestine with unspecified complications: Secondary | ICD-10-CM

## 2022-09-30 DIAGNOSIS — Z79899 Other long term (current) drug therapy: Secondary | ICD-10-CM

## 2022-09-30 DIAGNOSIS — H1131 Conjunctival hemorrhage, right eye: Secondary | ICD-10-CM

## 2022-09-30 DIAGNOSIS — J439 Emphysema, unspecified: Secondary | ICD-10-CM

## 2022-09-30 LAB — CBC WITH DIFFERENTIAL/PLATELET
Basophils Absolute: 83 cells/uL (ref 0–200)
HCT: 42.4 % (ref 38.5–50.0)
Lymphs Abs: 1303 cells/uL (ref 850–3900)
Monocytes Relative: 7.5 %
RBC: 4.66 10*6/uL (ref 4.20–5.80)
WBC: 8.3 10*3/uL (ref 3.8–10.8)

## 2022-09-30 NOTE — Progress Notes (Signed)
Follow Up  Assessment and Plan:   Essential hypertension Discussed DASH (Dietary Approaches to Stop Hypertension) DASH diet is lower in sodium than a typical American diet. Cut back on foods that are high in saturated fat, cholesterol, and trans fats. Eat more whole-grain foods, fish, poultry, and nuts Remain active and exercise as tolerated daily.  Monitor BP at home-Call if greater than 130/80.  Check CMP/CBC   ASHD (arteriosclerotic heart disease) Control blood pressure, cholesterol, glucose, increase exercise.  Stop smoking  Abnormal Glucose Education: Reviewed 'ABCs' of diabetes management  Discussed goals to be met and/or maintained include A1C (<7) Blood pressure (<130/80) Cholesterol (LDL <70) Continue Eye Exam yearly  Continue Dental Exam Q6 mo Discussed dietary recommendations Discussed Physical Activity recommendations Check A1C  Hyperlipidemia Discussed lifestyle modifications. Recommended diet heavy in fruits and veggies, omega 3's. Decrease consumption of animal meats, cheeses, and dairy products. Remain active and exercise as tolerated. Continue to monitor. Check lipids/TSH  Crohn's disease of ileum, unspecified complication (HCC) Continue GI follow up, diet modifications  Vitamin D deficiency Continue supplement for goal of 60-100 Monitor Vitamin D levels  Medication management All medications discussed and reviewed in full. All questions and concerns regarding medications addressed.    Left renal mass/History of renal cancer A/p nephrectomy, follows with urology. Continue to monitor   ADD (attention deficit disorder) Continue medication and behavior modifications Medications providing over 30% improvement of attention deficit hyperactivity disorder. No change in management. Using lowest effective dose. Discussed alternative pharmacological and non-pharmacological therapies. No suspected aberrant drug-taking behaviors. Continue to  monitor  Smoker Advised to quit smoking Smoking cessation instruction/counseling given:  counseled patient on the dangers of tobacco use, advised patient to stop smoking, and reviewed strategies to maximize success  Atherosclerosis of aorta (HCC) Control blood pressure, cholesterol, glucose, increase exercise.   Pulmonary Emphysema(HCC) Continue inhalers, quit smoking and monitor symptoms  Orders Placed This Encounter  Procedures   CBC with Differential/Platelet   COMPLETE METABOLIC PANEL WITH GFR   Lipid panel   Hemoglobin A1c   VITAMIN D 25 Hydroxy (Vit-D Deficiency, Fractures)    Notify office for further evaluation and treatment, questions or concerns if any reported s/s fail to improve.   The patient was advised to call back or seek an in-person evaluation if any symptoms worsen or if the condition fails to improve as anticipated.   Further disposition pending results of labs. Discussed med's effects and SE's.    I discussed the assessment and treatment plan with the patient. The patient was provided an opportunity to ask questions and all were answered. The patient agreed with the plan and demonstrated an understanding of the instructions.  Discussed med's effects and SE's. Screening labs and tests as requested with regular follow-up as recommended.  I provided 25 minutes of face-to-face time during this encounter including counseling, chart review, and critical decision making was preformed.  Today's Plan of Care is based on a patient-centered health care approach known as shared decision making - the decisions, tests and treatments allow for patient preferences and values to be balanced with clinical evidence.      Future Appointments  Date Time Provider Department Center  12/02/2022  3:40 PM Hilarie Fredrickson, MD [facility]  03/26/2023 10:00 AM Adela Glimpse, NP [facility] None    HPI 57 y.o. smoking whit male  presents for general follow  up with  hypertension, hyperlipidemia, prediabetes, CAD s/p stent  and vitamin D.  Overall he reports feeling well.  He has no new concerns at this time.  He had low dose chest CT for screening last year which showed: Lung-RADS 2, benign appearance or behavior. Continue annual screening with low-dose chest CT without contrast in 12 months. He does not want to pursue repeat this year. He is trying to quit smoking.  He has started to use caffeine coffee grind dip packets to help curb the nicotine cravings.  He continues to some 1 PPD.   His blood pressure has been controlled at home, today their BP is BP: 130/80 BP Readings from Last 3 Encounters:  09/30/22 130/80  07/31/22 116/73  07/16/22 128/84  He does not workout but is physically active.  He denies chest pain, shortness of breath, dizziness.      BMI is Body mass index is 35.29 kg/m., he is working on diet and exercise. Wt Readings from Last 3 Encounters:  09/30/22 239 lb (108.4 kg)  07/31/22 234 lb (106.1 kg)  07/16/22 234 lb (106.1 kg)   Crohn's follows with Dr. Marina Goodell. He had colonoscopy 09/10/21- multiple ulcers noted and is to have repeat colonoscopy in 5 years. He is scheduled him next week to discuss different medications changes.  Currently on Colestid and reports well controlled and effective.  Denies blood in stool, cramping, bloating and diarrhea.  He also follows with Dr. Isabel Caprice for left renal mass found during crohn's evaluation, had left partial nephrectomy 01/17/2015 and had + renal cell carcinoma following with urology. Has not seen doctor recently.  Denies any urological symptoms.   Granddaughter is 3 had previous heart surgery and doing well.   He has a history of ASHD S/P Stenting in 2010.  He is on ASA.  Denies dyspnea, exertional chest pressure/discomfort and irregular heart beat.    He is on cholesterol medication, lipitor 80mg  and denies myalgias. His cholesterol is at goal. The cholesterol last visit was:   Lab Results   Component Value Date   CHOL 164 03/24/2022   HDL 41 03/24/2022   LDLCALC 87 03/24/2022   TRIG 277 (H) 03/24/2022   CHOLHDL 4.0 03/24/2022    He has been working on diet and exercise for prediabetes, and denies paresthesia of the feet, polydipsia, polyuria and visual disturbances. Last A1C in the office was:  Lab Results  Component Value Date   HGBA1C 5.7 (H) 03/24/2022   Patient is on Vitamin D supplement.  Last vitamin D Lab Results  Component Value Date   VD25OH 30 03/24/2022     Current Medications:      Current Outpatient Medications (Cardiovascular):    atorvastatin (LIPITOR) 80 MG tablet, Take  1 tablet  Daily  for Cholesterol   colestipol (COLESTID) 1 g tablet, Take 1 tablet (1 g total) by mouth 2 (two) times daily.   ezetimibe (ZETIA) 10 MG tablet, Take 1 tablet (10 mg total) by mouth daily.   metoprolol tartrate (LOPRESSOR) 25 MG tablet, Take 1 tablet (25 mg total) by mouth 2 (two) times daily.   Current Outpatient Medications (Respiratory):    albuterol (VENTOLIN HFA) 108 (90 Base) MCG/ACT inhaler, Inhale 1-2 puffs into the lungs every 4 (four) hours as needed for wheezing or shortness of breath.   benzonatate (TESSALON PERLES) 100 MG capsule, Take 1 capsule (100 mg total) by mouth 3 (three) times daily as needed for cough.   Budeson-Glycopyrrol-Formoterol (BREZTRI AEROSPHERE) 160-9-4.8 MCG/ACT AERO, Inhale 2 puffs into the lungs in the morning and at bedtime.  Current Facility-Administered Medications (Respiratory):  ipratropium-albuterol (DUONEB) 0.5-2.5 (3) MG/3ML nebulizer solution 3 mL  Current Outpatient Medications (Analgesics):    aspirin 81 MG chewable tablet, Chew by mouth.   Current Outpatient Medications (Hematological):    Cyanocobalamin (VITAMIN B-12 PO), Take by mouth.   ferrous sulfate 325 (65 FE) MG tablet, Take 1 tablet (325 mg total) by mouth 2 (two) times daily. Patient needs office visit for further refills   Current Outpatient  Medications (Other):    amphetamine-dextroamphetamine (ADDERALL) 20 MG tablet, Take 1 tablet (20 mg total) by mouth 2 (two) times daily.   Multiple Vitamins-Minerals (MULTIVITAMIN WITH MINERALS) tablet, Take 1 tablet by mouth daily.   Multiple Vitamins-Minerals (ZINC PO), Take by mouth.  Medical History:  Past Medical History:  Diagnosis Date   ADD (attention deficit disorder)    Allergy    Appendicitis    Arthritis    ASHD (arteriosclerotic heart disease)    Cancer (HCC)    Colitis    Crohn's disease (HCC)    Eczema    Emphysema of lung (HCC)    GERD (gastroesophageal reflux disease)    Hyperlipidemia    Hypertension    Left renal mass    Morbid obesity (HCC)    Myocardial infarction (HCC)    status post RCA stenting in [place] 2011   Positive for macroalbuminuria    Renal cell carcinoma, left (HCC) 08/22/2019   Dr. Isabel Caprice for left renal mass found during crohn's evaluation, had left partial nephrectomy 01/17/2015 and had + renal cell carcinoma    Allergies Allergies  Allergen Reactions   Bee Venom Swelling    SURGICAL HISTORY He  has a past surgical history that includes Appendectomy; Coronary stent placement; Colon surgery; Robotic assited partial nephrectomy (Left, 01/17/2015); and Elbow surgery. FAMILY HISTORY His family history includes Crohn's disease in his paternal aunt and paternal uncle; Diabetes in his maternal grandmother. SOCIAL HISTORY He  reports that he has been smoking cigarettes. He has a 30.00 pack-year smoking history. He has never used smokeless tobacco. He reports current alcohol use. He reports that he does not currently use drugs after having used the following drugs: Marijuana.    Review of Systems:  Review of Systems  Constitutional: Negative.  Negative for chills and fever.  HENT:  Negative for congestion, hearing loss, sinus pain, sore throat and tinnitus.   Eyes: Negative.  Negative for blurred vision and double vision.  Respiratory:   Negative for cough, hemoptysis, sputum production, shortness of breath and wheezing.   Cardiovascular: Negative.  Negative for chest pain, palpitations and leg swelling.  Gastrointestinal: Negative.  Negative for abdominal pain, constipation, diarrhea, heartburn, nausea and vomiting.  Genitourinary: Negative.  Negative for dysuria and urgency.  Musculoskeletal:  Negative for back pain, falls, joint pain, myalgias and neck pain.  Skin:  Negative for rash.  Neurological: Negative.  Negative for dizziness, tingling, tremors, weakness and headaches.  Endo/Heme/Allergies: Negative.  Does not bruise/bleed easily.  Psychiatric/Behavioral: Negative.  Negative for depression and suicidal ideas. The patient is not nervous/anxious and does not have insomnia.    Physical Exam: BP 130/80   Pulse 72   Temp 97.8 F (36.6 C)   Ht 5\' 9"  (1.753 m)   Wt 239 lb (108.4 kg)   SpO2 97%   BMI 35.29 kg/m  Wt Readings from Last 3 Encounters:  09/30/22 239 lb (108.4 kg)  07/31/22 234 lb (106.1 kg)  07/16/22 234 lb (106.1 kg)   General Appearance: Well nourished, in no apparent distress.  Eyes: PERRLA, EOMs, conjunctiva no swelling or erythema Sinuses: No Frontal/maxillary tenderness ENT/Mouth: Ext aud canals clear, TMs without erythema, bulging. No erythema, swelling, or exudate on post pharynx.  Tonsils not swollen or erythematous. Hearing normal.  Neck: Supple, thyroid normal.  Respiratory: Respiratory effort normal, BS expiratory wheezes bilaterally on exertion.  Cardio: RRR with no MRGs. Brisk peripheral pulses without edema.  Abdomen: Soft, + BS, obese,  Non tender, no guarding, rebound, hernias, masses. Lymphatics: Non tender without lymphadenopathy.  Musculoskeletal: Full ROM, 5/5 strength, Normal Skin: Several healed scars along AB. Warm, dry without rashes, lesions, ecchymosis.  Neuro: Cranial nerves intact. Normal muscle tone except some minor muscle wasting lateral right hand with decrease  extension of 3 lateral fingers, normal distal vascular, no cerebellar symptoms. Psych: Awake and oriented X 3, normal affect, Insight and Judgment appropriate.    Adela Glimpse, NP 1:24 PM [facility]

## 2022-09-30 NOTE — Patient Instructions (Signed)
Smoking Tobacco Information, Adult Smoking tobacco can be harmful to your health. Tobacco contains a toxic colorless chemical called nicotine. Nicotine causes changes in your brain that make you want more and more. This is called addiction. This can make it hard to stop smoking once you start. Tobacco also has other toxic chemicals that can hurt your body and raise your risk of many cancers. Menthol or "lite" tobacco or cigarette brands are not safer than regular brands. How can smoking tobacco affect me? Smoking tobacco puts you at risk for: Cancer. Smoking is most commonly associated with lung cancer, but can also lead to cancer in other parts of the body. Chronic obstructive pulmonary disease (COPD). This is a long-term lung condition that makes it hard to breathe. It also gets worse over time. High blood pressure (hypertension), heart disease, stroke, heart attack, and lung infections, such as pneumonia. Cataracts. This is when the lenses in the eyes become clouded. Digestive problems. This may include peptic ulcers, heartburn, and gastroesophageal reflux disease (GERD). Oral health problems, such as gum disease, mouth sores, and tooth loss. Loss of taste and smell. Smoking also affects how you look and smell. Smoking may cause: Wrinkles. Yellow or stained teeth, fingers, and fingernails. Bad breath. Bad-smelling clothes and hair. Smoking tobacco can also affect your social life, because: It may be challenging to find places to smoke when away from home. Many workplaces, restaurants, hotels, and public places are tobacco-free. Smoking is expensive. This is due to the cost of tobacco and the long-term costs of treating health problems from smoking. Secondhand smoke may affect those around you. Secondhand smoke can cause lung cancer, breathing problems, and heart disease. Children of smokers have a higher risk for: Sudden infant death syndrome (SIDS). Ear infections. Lung infections. What  actions can I take to prevent health problems? Quit smoking  Do not start smoking. Quit if you already smoke. Do not replace cigarette smoking with vaping devices, such as e-cigarettes. Make a plan to quit smoking and commit to it. Look for programs to help you, and ask your health care provider for recommendations and ideas. Set a date and write down all the reasons you want to quit. Let your friends and family know you are quitting so they can help and support you. Consider finding friends who also want to quit. It can be easier to quit with someone else, so that you can support each other. Talk with your health care provider about using nicotine replacement medicines to help you quit. These include gum, lozenges, patches, sprays, or pills. If you try to quit but return to smoking, stay positive. It is common to slip up when you first quit, so take it one day at a time. Be prepared for cravings. When you feel the urge to smoke, chew gum or suck on hard candy. Lifestyle Stay busy. Take care of your body. Get plenty of exercise, eat a healthy diet, and drink plenty of water. Find ways to manage your stress, such as meditation, yoga, exercise, or time spent with friends and family. Ask your health care provider about having regular tests (screenings) to check for cancer. This may include blood tests, imaging tests, and other tests. Where to find support To get support to quit smoking, consider: Asking your health care provider for more information and resources. Joining a support group for people who want to quit smoking in your local community. There are many effective programs that may help you to quit. Calling the smokefree.gov counselor   helpline at 1-800-QUIT-NOW (1-800-784-8669). Where to find more information You may find more information about quitting smoking from: Centers for Disease Control and Prevention: cdc.gov/tobacco Smokefree.gov: smokefree.gov American Lung Association:  freedomfromsmoking.org Contact a health care provider if: You have problems breathing. Your lips, nose, or fingers turn blue. You have chest pain. You are coughing up blood. You feel like you will faint. You have other health changes that cause you to worry. Summary Smoking tobacco can negatively affect your health, the health of those around you, your finances, and your social life. Do not start smoking. Quit if you already smoke. If you need help quitting, ask your health care provider. Consider joining a support group for people in your local community who want to quit smoking. There are many effective programs that may help you to quit. This information is not intended to replace advice given to you by your health care provider. Make sure you discuss any questions you have with your health care provider. Document Revised: 04/30/2021 Document Reviewed: 04/30/2021 Elsevier Patient Education  2023 [employer]  

## 2022-10-01 LAB — COMPLETE METABOLIC PANEL WITH GFR
AG Ratio: 1.3 (calc) (ref 1.0–2.5)
ALT: 21 U/L (ref 9–46)
AST: 20 U/L (ref 10–35)
Albumin: 3.8 g/dL (ref 3.6–5.1)
Alkaline phosphatase (APISO): 93 U/L (ref 35–144)
BUN: 19 mg/dL (ref 7–25)
CO2: 25 mmol/L (ref 20–32)
Calcium: 9.1 mg/dL (ref 8.6–10.3)
Chloride: 106 mmol/L (ref 98–110)
Creat: 0.87 mg/dL (ref 0.70–1.30)
Globulin: 3 g/dL (calc) (ref 1.9–3.7)
Glucose, Bld: 85 mg/dL (ref 65–99)
Potassium: 4.6 mmol/L (ref 3.5–5.3)
Sodium: 139 mmol/L (ref 135–146)
Total Bilirubin: 0.3 mg/dL (ref 0.2–1.2)
Total Protein: 6.8 g/dL (ref 6.1–8.1)
eGFR: 100 mL/min/{1.73_m2} (ref >=60)

## 2022-10-01 LAB — CBC WITH DIFFERENTIAL/PLATELET
Absolute Monocytes: 623 cells/uL (ref 200–950)
Basophils Relative: 1 %
Eosinophils Absolute: 241 cells/uL (ref 15–500)
Eosinophils Relative: 2.9 %
Hemoglobin: 14.3 g/dL (ref 13.2–17.1)
MCH: 30.7 pg (ref 27.0–33.0)
MCHC: 33.7 g/dL (ref 32.0–36.0)
MCV: 91 fL (ref 80.0–100.0)
MPV: 10 fL (ref 7.5–12.5)
Neutro Abs: 6051 cells/uL (ref 1500–7800)
Neutrophils Relative %: 72.9 %
Platelets: 317 10*3/uL (ref 140–400)
RDW: 13.1 % (ref 11.0–15.0)
Total Lymphocyte: 15.7 %

## 2022-10-01 LAB — LIPID PANEL
Cholesterol: 158 mg/dL (ref <200)
HDL: 35 mg/dL — ABNORMAL LOW (ref >=40)
LDL Cholesterol (Calc): 93 mg/dL (calc)
Non-HDL Cholesterol (Calc): 123 mg/dL (calc) (ref <130)
Total CHOL/HDL Ratio: 4.5 (calc) (ref <5.0)
Triglycerides: 205 mg/dL — ABNORMAL HIGH (ref <150)

## 2022-10-01 LAB — VITAMIN D 25 HYDROXY (VIT D DEFICIENCY, FRACTURES): Vit D, 25-Hydroxy: 27 ng/mL — ABNORMAL LOW (ref 30–100)

## 2022-10-01 LAB — HEMOGLOBIN A1C
Hgb A1c MFr Bld: 5.8 % of total Hgb — ABNORMAL HIGH (ref <5.7)
Mean Plasma Glucose: 120 mg/dL
eAG (mmol/L): 6.6 mmol/L

## 2022-10-01 MED ORDER — ERYTHROMYCIN 5 MG/GM OP OINT
TOPICAL_OINTMENT | OPHTHALMIC | 0 refills | Status: DC
Start: 2022-10-01 — End: 2023-04-01

## 2022-10-01 NOTE — Addendum Note (Signed)
Addended by: Adela Glimpse on: 10/01/2022 09:17 AM   Modules accepted: Orders

## 2022-11-23 ENCOUNTER — Other Ambulatory Visit: Payer: Self-pay | Admitting: Nurse Practitioner

## 2022-11-23 DIAGNOSIS — I10 Essential (primary) hypertension: Secondary | ICD-10-CM

## 2022-12-02 ENCOUNTER — Ambulatory Visit: Payer: Federal, State, Local not specified - PPO | Admitting: Internal Medicine

## 2023-02-05 ENCOUNTER — Encounter: Payer: Federal, State, Local not specified - PPO | Admitting: Nurse Practitioner

## 2023-02-10 ENCOUNTER — Other Ambulatory Visit: Payer: Self-pay | Admitting: Nurse Practitioner

## 2023-02-10 DIAGNOSIS — J439 Emphysema, unspecified: Secondary | ICD-10-CM

## 2023-03-10 ENCOUNTER — Ambulatory Visit: Payer: Federal, State, Local not specified - PPO | Admitting: Internal Medicine

## 2023-03-25 ENCOUNTER — Encounter: Payer: Federal, State, Local not specified - PPO | Admitting: Nurse Practitioner

## 2023-03-26 ENCOUNTER — Encounter: Payer: Federal, State, Local not specified - PPO | Admitting: Nurse Practitioner

## 2023-04-01 ENCOUNTER — Encounter: Payer: Self-pay | Admitting: Nurse Practitioner

## 2023-04-01 ENCOUNTER — Ambulatory Visit (INDEPENDENT_AMBULATORY_CARE_PROVIDER_SITE_OTHER): Payer: Federal, State, Local not specified - PPO | Admitting: Nurse Practitioner

## 2023-04-01 VITALS — BP 132/82 | HR 77 | Temp 97.7°F | Ht 69.0 in | Wt 242.4 lb

## 2023-04-01 DIAGNOSIS — Z0001 Encounter for general adult medical examination with abnormal findings: Secondary | ICD-10-CM

## 2023-04-01 DIAGNOSIS — Z130 Encounter for screening for diseases of the blood and blood-forming organs and certain disorders involving the immune mechanism: Secondary | ICD-10-CM | POA: Diagnosis not present

## 2023-04-01 DIAGNOSIS — Z13220 Encounter for screening for lipoid disorders: Secondary | ICD-10-CM

## 2023-04-01 DIAGNOSIS — F17200 Nicotine dependence, unspecified, uncomplicated: Secondary | ICD-10-CM

## 2023-04-01 DIAGNOSIS — I700 Atherosclerosis of aorta: Secondary | ICD-10-CM

## 2023-04-01 DIAGNOSIS — K50019 Crohn's disease of small intestine with unspecified complications: Secondary | ICD-10-CM

## 2023-04-01 DIAGNOSIS — Z1389 Encounter for screening for other disorder: Secondary | ICD-10-CM

## 2023-04-01 DIAGNOSIS — E782 Mixed hyperlipidemia: Secondary | ICD-10-CM

## 2023-04-01 DIAGNOSIS — J439 Emphysema, unspecified: Secondary | ICD-10-CM

## 2023-04-01 DIAGNOSIS — I10 Essential (primary) hypertension: Secondary | ICD-10-CM

## 2023-04-01 DIAGNOSIS — N401 Enlarged prostate with lower urinary tract symptoms: Secondary | ICD-10-CM

## 2023-04-01 DIAGNOSIS — Z0000 Encounter for general adult medical examination without abnormal findings: Secondary | ICD-10-CM | POA: Diagnosis not present

## 2023-04-01 DIAGNOSIS — Z136 Encounter for screening for cardiovascular disorders: Secondary | ICD-10-CM

## 2023-04-01 DIAGNOSIS — I2510 Atherosclerotic heart disease of native coronary artery without angina pectoris: Secondary | ICD-10-CM

## 2023-04-01 DIAGNOSIS — F902 Attention-deficit hyperactivity disorder, combined type: Secondary | ICD-10-CM

## 2023-04-01 DIAGNOSIS — R7309 Other abnormal glucose: Secondary | ICD-10-CM

## 2023-04-01 DIAGNOSIS — Z79899 Other long term (current) drug therapy: Secondary | ICD-10-CM | POA: Diagnosis not present

## 2023-04-01 DIAGNOSIS — E559 Vitamin D deficiency, unspecified: Secondary | ICD-10-CM | POA: Diagnosis not present

## 2023-04-01 DIAGNOSIS — Z125 Encounter for screening for malignant neoplasm of prostate: Secondary | ICD-10-CM | POA: Diagnosis not present

## 2023-04-01 DIAGNOSIS — Z131 Encounter for screening for diabetes mellitus: Secondary | ICD-10-CM | POA: Diagnosis not present

## 2023-04-01 DIAGNOSIS — Z1329 Encounter for screening for other suspected endocrine disorder: Secondary | ICD-10-CM

## 2023-04-01 DIAGNOSIS — R350 Frequency of micturition: Secondary | ICD-10-CM | POA: Diagnosis not present

## 2023-04-01 DIAGNOSIS — D3502 Benign neoplasm of left adrenal gland: Secondary | ICD-10-CM

## 2023-04-01 DIAGNOSIS — D509 Iron deficiency anemia, unspecified: Secondary | ICD-10-CM

## 2023-04-01 MED ORDER — METOPROLOL TARTRATE 25 MG PO TABS: 25.0000 mg | ORAL_TABLET | Freq: Two times a day (BID) | ORAL | 1 refills | Status: AC

## 2023-04-01 MED ORDER — AMPHETAMINE-DEXTROAMPHETAMINE 20 MG PO TABS: 20.0000 mg | ORAL_TABLET | Freq: Two times a day (BID) | ORAL | 0 refills | Status: AC

## 2023-04-01 NOTE — Patient Instructions (Signed)

## 2023-04-01 NOTE — Progress Notes (Signed)
Complete Physical  Cole [name] presents today for a CPE  Below references diagnostics and current Plan of Care   Assessment and Plan:   Encounter for general adult medical examination with abnormal findings Due annually  Health maintenance reviewed Healthily lifestyle goals set  Essential hypertension Controlled - no changes to medications - continue to take as directed. Discussed DASH (Dietary Approaches to Stop Hypertension) DASH diet is lower in sodium than a typical American diet. Cut back on foods that are high in saturated fat, cholesterol, and trans fats. Eat more whole-grain foods, fish, poultry, and nuts Remain active and exercise as tolerated daily.  Monitor BP at home-Call if greater than 130/80.  Check CMP/CBC  Abnormal Glucose Education: Reviewed 'ABCs' of diabetes management  Discussed goals to be met and/or maintained include A1C (<7) Blood pressure (<130/80) Cholesterol (LDL <70) Continue Eye Exam yearly  Continue Dental Exam Q6 mo Discussed dietary recommendations Discussed Physical Activity recommendations Check A1C  Hyperlipidemia/ ASHD (arteriosclerotic heart disease)/Atherosclerosis of aorta (HCC) Control blood pressure, cholesterol, glucose, increase exercise.  Stop smoking Discussed lifestyle modifications. Recommended diet heavy in fruits and veggies, omega 3's. Decrease consumption of animal meats, cheeses, and dairy products. Remain active and exercise as tolerated. Continue to monitor. Check lipids/TSH  Crohn's disease of ileum, unspecified complication (HCC) Continue GI follow up, diet modifications  Vitamin D deficiency Continue supplement for goal of 60-100 Monitor Vitamin D levels  Medication management All medications discussed and reviewed in full. All questions and concerns regarding medications addressed.    Left renal mass/History of renal cancer A/p nephrectomy, follow up urology PRN  ADD (attention deficit  disorder) Medications providing over 30% improvement of attention deficit hyperactivity disorder. No change in management. Using lowest effective dose. Discussed alternative pharmacological and non-pharmacological therapies. No suspected aberrant drug-taking behaviors. Continue to monitor  Smoker/Pulmonary Emphysema(HCC) Continue inhalers, quit smoking and monitor symptoms Smoking cessation instruction/counseling given:  counseled patient on the dangers of tobacco use, advised patient to stop smoking, and reviewed strategies to maximize success  Screening PSA (prostate specific antigen) -     PSA  Screening for deficiency anemia -     CBC with Differential/Platelet/B12  Screening for thyroid disorder -     TSH  Screening for cardiovascular condition -     EKG 12-Lead  Screening for blood or protein in urine -     Microalbumin / creatinine urine ratio -     Urinalysis, Routine w reflex microscopic  Screening for AAA - ABD U/S retroperitoneal LTD  Orders Placed This Encounter  Procedures   CBC with Differential/Platelet   COMPLETE METABOLIC PANEL WITH GFR   Magnesium   Lipid panel   TSH   Hemoglobin A1c   Insulin, random   VITAMIN D 25 Hydroxy (Vit-D Deficiency, Fractures)   Urinalysis, Routine w reflex microscopic   Microalbumin / creatinine urine ratio   Vitamin B12   PSA   EKG 12-Lead   Meds ordered this encounter  Medications   amphetamine-dextroamphetamine (ADDERALL) 20 MG tablet    Sig: Take 1 tablet (20 mg total) by mouth 2 (two) times daily.    Dispense:  60 tablet    Refill:  0    Order Specific Question:   Supervising Provider    Answer:   Lucky Cowboy [[id number]]   metoprolol tartrate (LOPRESSOR) 25 MG tablet    Sig: Take 1 tablet (25 mg total) by mouth 2 (two) times daily.    Dispense:  180 tablet  Refill:  1    Order Specific Question:   Supervising Provider    Answer:   Lucky Cowboy [phone]    Notify office for further evaluation and  treatment, questions or concerns if any reported s/s fail to improve.   The patient was advised to call back or seek an in-person evaluation if any symptoms worsen or if the condition fails to improve as anticipated.   Further disposition pending results of labs. Discussed med's effects and SE's.    I discussed the assessment and treatment plan with the patient. The patient was provided an opportunity to ask questions and all were answered. The patient agreed with the plan and demonstrated an understanding of the instructions.  Discussed med's effects and SE's. Screening labs and tests as requested with regular follow-up as recommended.  I provided 40 minutes of face-to-face time during this encounter including counseling, chart review, and critical decision making was preformed.  Today's Plan of Care is based on a patient-centered health care approach known as shared decision making - the decisions, tests and treatments allow for patient preferences and values to be balanced with clinical evidence.    Future Appointments  Date Time Provider Department Center  06/12/2023  2:40 PM Hilarie Fredrickson, MD [facility]  03/31/2024  3:00 PM Adela Glimpse, NP [facility] None    HPI 57 y.o. smoking whit male  presents for CPE and  3 month follow up with hypertension, hyperlipidemia, prediabetes, CAD s/p stent  and vitamin D.  Overall he reports feeling well today.  He has no new concerns at this time.  He was seen in the ER on 03/12/22 for exacerbation of COPD.  He was given a 5 day course of Prednisone and instructed to use albuterol inhaler as needed. He is currently using Breztri daily without the use of SABA.    Continues to smoke 1 ppd. No ready to quit not interested in quitting. He had low dose chest CT for screening 12/2019 which showed: Lung-RADS 2, benign appearance or behavior. He is overdue for annual screening with low-dose chest CT without contrast in 12 months. He does not want to  pursue repeat this year.   His blood pressure has been controlled at home, today their BP is BP: 132/82 BP Readings from Last 3 Encounters:  04/01/23 132/82  09/30/22 130/80  07/31/22 116/73  He does not workout but is physically active.  He denies chest pain, shortness of breath, dizziness.     BMI is Body mass index is 35.8 kg/m., he is working on diet and exercise. Wt Readings from Last 3 Encounters:  04/01/23 242 lb 6.4 oz (110 kg)  09/30/22 239 lb (108.4 kg)  07/31/22 234 lb (106.1 kg)   Crohn's follows with Dr. Marina Goodell. He had colonoscopy 09/10/21- multiple ulcers noted and is to have repeat colonoscopy in 5 years. He is scheduled to see him in December for medication for his Crohns. Denies blood in stool, cramping, bloating and diarrhea   He also follows with Dr. Isabel Caprice for left renal mass found during crohn's evaluation, had left partial nephrectomy 01/17/2015 and had + renal cell carcinoma following with urology.  Follows PRN with no recent issues.   Granddaughter is 3 had previous heart surgery and doing well.   He has a history of ASHD S/P Stenting in 2010.  He is on ASA.  Denies dyspnea, exertional chest pressure/discomfort and irregular heart beat.   He is on cholesterol medication, lipitor 80mg  and denies myalgias.  His cholesterol is at goal. The cholesterol last visit was:   Lab Results  Component Value Date   CHOL 158 09/30/2022   HDL 35 (L) 09/30/2022   LDLCALC 93 09/30/2022   TRIG 205 (H) 09/30/2022   CHOLHDL 4.5 09/30/2022    He has been working on diet and exercise for prediabetes, and denies paresthesia of the feet, polydipsia, polyuria and visual disturbances. Last A1C in the office was:  Lab Results  Component Value Date   HGBA1C 5.8 (H) 09/30/2022   Patient is on Vitamin D supplement.  Last vitamin D Lab Results  Component Value Date   VD25OH 27 (L) 09/30/2022    BMI is Body mass index is 35.8 kg/m., he is working on diet and exercise.  He is trying to  eat less saturated fat and simple carbs. 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   ezetimibe (ZETIA) 10 MG tablet, Take 1 tablet (10 mg total) by mouth daily.   metoprolol tartrate (LOPRESSOR) 25 MG tablet, Take 1 tablet (25 mg total) by mouth 2 (two) times daily.   Current Outpatient Medications (Respiratory):    albuterol (VENTOLIN HFA) 108 (90 Base) MCG/ACT inhaler, Inhale 1-2 puffs into the lungs every 4 (four) hours as needed for wheezing or shortness of breath.   benzonatate (TESSALON PERLES) 100 MG capsule, Take 1 capsule (100 mg total) by mouth 3 (three) times daily as needed for cough.   BREZTRI AEROSPHERE 160-9-4.8 MCG/ACT AERO, INHALE 2 PUFFS BY MOUTH TWICE A DAY IN THE MORNING AND IN THE EVENING  Current Facility-Administered Medications (Respiratory):    ipratropium-albuterol (DUONEB) 0.5-2.5 (3) MG/3ML nebulizer solution 3 mL  Current Outpatient Medications (Analgesics):    aspirin 81 MG chewable tablet, Chew by mouth.   Current Outpatient Medications (Hematological):    Cyanocobalamin (VITAMIN B-12 PO), Take by mouth.   ferrous sulfate 325 (65 FE) MG tablet, Take 1 tablet (325 mg total) by mouth 2 (two) times daily. Patient needs office visit for further refills (Patient not taking: Reported on 04/01/2023)   Current Outpatient Medications (Other):    Multiple Vitamins-Minerals (MULTIVITAMIN WITH MINERALS) tablet, Take 1 tablet by mouth daily.   Multiple Vitamins-Minerals (ZINC PO), Take by mouth.   amphetamine-dextroamphetamine (ADDERALL) 20 MG tablet, Take 1 tablet (20 mg total) by mouth  2 (two) times daily.   erythromycin ophthalmic ointment, Apply 1 cm ribbon in right eye TID x 7 days.   Medical History:  Past Medical History:  Diagnosis Date   ADD (attention deficit disorder)    Allergy    Appendicitis    Arthritis    ASHD (arteriosclerotic heart disease)    Cancer (HCC)    Colitis    Crohn's disease (HCC)    Eczema    Emphysema of lung (HCC)    GERD (gastroesophageal reflux disease)    Hyperlipidemia    Hypertension    Left renal mass    Morbid obesity (HCC)    Myocardial infarction (HCC)    status post RCA stenting in [place] 2011   Positive for macroalbuminuria    Renal cell carcinoma, left (HCC) 08/22/2019   Dr. Isabel Caprice for left renal mass found during crohn's evaluation, had left partial nephrectomy 01/17/2015 and had + renal cell carcinoma    Allergies Allergies  Allergen Reactions   Bee Venom Swelling    SURGICAL HISTORY He  has a past surgical history that includes Appendectomy; Coronary stent placement; Colon surgery; Robotic assited partial nephrectomy (Left, 01/17/2015); and Elbow surgery. FAMILY HISTORY His family history includes Crohn's disease in his paternal aunt and paternal uncle; Diabetes in his maternal grandmother. SOCIAL HISTORY He  reports that he has been smoking cigarettes. He has a 30 pack-year smoking history. He has never used smokeless tobacco. He reports current alcohol use. He reports that he does not currently use drugs after having used the following drugs: Marijuana.   Immunization History  Administered Date(s) Administered   Td 05/19/2009   Tdap 12/05/2019, 03/26/2022   Health Maintenance  Topic Date Due   COVID-19 Vaccine (1) Never done   HIV Screening  Never done   Zoster Vaccines- Shingrix (1 of 2) Never done   Lung Cancer Screening  12/25/2020   Colonoscopy  07/31/2027   DTaP/Tdap/Td (4 - Td or Tdap) 03/26/2032   Hepatitis C Screening  Completed   HPV VACCINES  Aged Out   INFLUENZA VACCINE   Discontinued    Declines the influenza vaccine and COVID vaccines  Colonoscopy 07/2022 f/u 5 years EGD: 04/2017 normal Ct AB pelvis 03/17/2014 CXR 03/2018 MRI AB 08/2014 Echo 08/20161.  Low dose chest CT 12/26/19:1. Lung-RADS 2, benign appearance or behavior. Continue annual defers at this time  Dr. Hyacinth Meeker eye, 2-3 months, 05/2021  plans to reach out.   Has dentures full set.  Review of Systems:  Review of Systems  Constitutional: Negative.  Negative for chills and fever.  HENT:  Positive for congestion and sinus pain. Negative for hearing loss, sore throat and tinnitus.   Eyes: Negative.  Negative for blurred vision and double vision.  Respiratory:  Positive for cough (productive) and sputum production (yellow). Negative for hemoptysis, shortness of breath and wheezing.   Cardiovascular: Negative.  Negative for chest pain, palpitations and leg swelling.  Gastrointestinal: Negative.  Negative for abdominal pain, constipation, diarrhea, heartburn, nausea and vomiting.  Genitourinary: Negative.  Negative for dysuria and urgency.  Musculoskeletal:  Negative for back pain, falls, joint pain, myalgias and neck pain.  Skin:  Negative for rash.  Neurological: Negative.  Negative for dizziness, tingling, tremors, weakness and headaches.  Endo/Heme/Allergies: Negative.  Does not bruise/bleed easily.  Psychiatric/Behavioral: Negative.  Negative for depression and suicidal ideas. The patient is not nervous/anxious and does not have insomnia.    Physical Exam: BP 132/82   Pulse 77   Temp 97.7 F (36.5 C)   Ht 5\' 9"  (1.753 m)   Wt 242 lb 6.4 oz (110 kg)   SpO2 98%   BMI 35.80 kg/m  Wt Readings from Last 3 Encounters:  04/01/23 242 lb 6.4 oz (110 kg)  09/30/22 239 lb (108.4 kg)  07/31/22 234 lb (106.1 kg)   General Appearance: Well nourished, in no apparent distress. Eyes: PERRLA, EOMs, conjunctiva no swelling or erythema Sinuses: No Frontal/maxillary tenderness ENT/Mouth: Ext aud  canals clear, TMs without erythema, bulging. No erythema, swelling, or exudate on post pharynx.  Tonsils not swollen or erythematous. Hearing normal.  Neck: Supple, thyroid normal.  Respiratory: Respiratory effort normal, BS expiratory wheezes bilaterally on exertion. Neb treatment  done in the office with duoneb- wheezing decreased dramatically after neb treatment Cardio: RRR with no MRGs. Brisk peripheral pulses without edema.  Abdomen: Soft, + BS, obese,  Non tender, no guarding, rebound, hernias, masses. Lymphatics: Non tender without lymphadenopathy.  Musculoskeletal: Full ROM, 5/5 strength, Normal Skin: Several healed scars along AB. Warm, dry without rashes, lesions, ecchymosis.  Neuro: Cranial nerves intact. Normal muscle tone except some minor muscle wasting lateral right hand with decrease extension of 3 lateral fingers, normal distal vascular, no cerebellar symptoms. Psych: Awake and oriented X 3, normal affect, Insight and Judgment appropriate.   EKG IRBBB, 1st degree Av block- no ST changes AAA: < 3 cm   Adela Glimpse, NP 3:46 PM [facility]

## 2023-04-02 LAB — VITAMIN B12: Vitamin B-12: 369 pg/mL (ref 200–1100)

## 2023-04-02 LAB — HEMOGLOBIN A1C
Hgb A1c MFr Bld: 5.6 %{Hb} (ref <5.7)
Mean Plasma Glucose: 114 mg/dL
eAG (mmol/L): 6.3 mmol/L

## 2023-04-02 LAB — CBC WITH DIFFERENTIAL/PLATELET
Absolute Lymphocytes: 1501 {cells}/uL (ref 850–3900)
Absolute Monocytes: 569 {cells}/uL (ref 200–950)
Basophils Absolute: 47 {cells}/uL (ref 0–200)
Basophils Relative: 0.6 %
Eosinophils Absolute: 253 {cells}/uL (ref 15–500)
Eosinophils Relative: 3.2 %
HCT: 45.6 % (ref 38.5–50.0)
Hemoglobin: 14.9 g/dL (ref 13.2–17.1)
MCH: 29.9 pg (ref 27.0–33.0)
MCHC: 32.7 g/dL (ref 32.0–36.0)
MCV: 91.6 fL (ref 80.0–100.0)
MPV: 10.6 fL (ref 7.5–12.5)
Monocytes Relative: 7.2 %
Neutro Abs: 5530 {cells}/uL (ref 1500–7800)
Neutrophils Relative %: 70 %
Platelets: 325 10*3/uL (ref 140–400)
RBC: 4.98 10*6/uL (ref 4.20–5.80)
RDW: 12.8 % (ref 11.0–15.0)
Total Lymphocyte: 19 %
WBC: 7.9 10*3/uL (ref 3.8–10.8)

## 2023-04-02 LAB — COMPLETE METABOLIC PANEL WITH GFR
AG Ratio: 1.3 (calc) (ref 1.0–2.5)
ALT: 23 U/L (ref 9–46)
AST: 21 U/L (ref 10–35)
Albumin: 4.2 g/dL (ref 3.6–5.1)
Alkaline phosphatase (APISO): 108 U/L (ref 35–144)
BUN: 18 mg/dL (ref 7–25)
CO2: 26 mmol/L (ref 20–32)
Calcium: 9.4 mg/dL (ref 8.6–10.3)
Chloride: 104 mmol/L (ref 98–110)
Creat: 0.88 mg/dL (ref 0.70–1.30)
Globulin: 3.3 g/dL (ref 1.9–3.7)
Glucose, Bld: 74 mg/dL (ref 65–99)
Potassium: 4.6 mmol/L (ref 3.5–5.3)
Sodium: 139 mmol/L (ref 135–146)
Total Bilirubin: 0.3 mg/dL (ref 0.2–1.2)
Total Protein: 7.5 g/dL (ref 6.1–8.1)
eGFR: 100 mL/min/{1.73_m2} (ref >=60)

## 2023-04-02 LAB — LIPID PANEL
Cholesterol: 179 mg/dL (ref <200)
HDL: 41 mg/dL (ref >=40)
LDL Cholesterol (Calc): 112 mg/dL — ABNORMAL HIGH
Non-HDL Cholesterol (Calc): 138 mg/dL — ABNORMAL HIGH (ref <130)
Total CHOL/HDL Ratio: 4.4 (calc) (ref <5.0)
Triglycerides: 147 mg/dL (ref <150)

## 2023-04-02 LAB — URINALYSIS, ROUTINE W REFLEX MICROSCOPIC
Bacteria, UA: NONE SEEN /[HPF]
Bilirubin Urine: NEGATIVE
Glucose, UA: NEGATIVE
Hgb urine dipstick: NEGATIVE
Hyaline Cast: NONE SEEN /[LPF]
Ketones, ur: NEGATIVE
Leukocytes,Ua: NEGATIVE
Nitrite: NEGATIVE
RBC / HPF: NONE SEEN /[HPF] (ref 0–2)
Specific Gravity, Urine: 1.021 (ref 1.001–1.035)
Squamous Epithelial / HPF: NONE SEEN /[HPF] (ref <=5)
WBC, UA: NONE SEEN /[HPF] (ref 0–5)
pH: 5.5 (ref 5.0–8.0)

## 2023-04-02 LAB — VITAMIN D 25 HYDROXY (VIT D DEFICIENCY, FRACTURES): Vit D, 25-Hydroxy: 34 ng/mL (ref 30–100)

## 2023-04-02 LAB — MICROALBUMIN / CREATININE URINE RATIO
Creatinine, Urine: 124 mg/dL (ref 20–320)
Microalb Creat Ratio: 239 mg/g{creat} — ABNORMAL HIGH (ref <30)
Microalb, Ur: 29.6 mg/dL

## 2023-04-02 LAB — PSA: PSA: 0.37 ng/mL (ref <=4.00)

## 2023-04-02 LAB — MICROSCOPIC MESSAGE

## 2023-04-02 LAB — TSH: TSH: 2.83 m[IU]/L (ref 0.40–4.50)

## 2023-04-02 LAB — INSULIN, RANDOM: Insulin: 11 u[IU]/mL

## 2023-04-02 LAB — MAGNESIUM: Magnesium: 2.1 mg/dL (ref 1.5–2.5)

## 2023-04-12 DIAGNOSIS — J189 Pneumonia, unspecified organism: Secondary | ICD-10-CM | POA: Diagnosis not present

## 2023-04-12 DIAGNOSIS — R062 Wheezing: Secondary | ICD-10-CM | POA: Diagnosis not present

## 2023-06-10 ENCOUNTER — Other Ambulatory Visit: Payer: Self-pay | Admitting: Internal Medicine

## 2023-06-12 ENCOUNTER — Encounter: Payer: Self-pay | Admitting: Internal Medicine

## 2023-06-12 ENCOUNTER — Ambulatory Visit: Payer: Federal, State, Local not specified - PPO | Admitting: Internal Medicine

## 2023-06-12 VITALS — BP 144/82 | HR 82 | Ht 69.0 in | Wt 246.0 lb

## 2023-06-12 DIAGNOSIS — D509 Iron deficiency anemia, unspecified: Secondary | ICD-10-CM

## 2023-06-12 DIAGNOSIS — K50019 Crohn's disease of small intestine with unspecified complications: Secondary | ICD-10-CM | POA: Diagnosis not present

## 2023-06-12 DIAGNOSIS — R197 Diarrhea, unspecified: Secondary | ICD-10-CM | POA: Diagnosis not present

## 2023-06-12 DIAGNOSIS — F17210 Nicotine dependence, cigarettes, uncomplicated: Secondary | ICD-10-CM

## 2023-06-12 MED ORDER — COLESTIPOL HCL 1 G PO TABS: 1.0000 g | ORAL_TABLET | Freq: Two times a day (BID) | ORAL | 3 refills | Status: AC

## 2023-06-12 NOTE — Patient Instructions (Signed)
We have sent the following medications to your pharmacy for you to pick up at your convenience:  Colestid  _______________________________________________________  If your blood pressure at your visit was 140/90 or greater, please contact your primary care physician to follow up on this.  _______________________________________________________  If you are age 59 or older, your body mass index should be between 23-30. Your Body mass index is 36.33 kg/m. If this is out of the aforementioned range listed, please consider follow up with your Primary Care Provider.  If you are age 18 or younger, your body mass index should be between 19-25. Your Body mass index is 36.33 kg/m. If this is out of the aformentioned range listed, please consider follow up with your Primary Care Provider.   ________________________________________________________  The [facility] providers would like to encourage you to use [facility] to communicate with providers for non-urgent requests or questions.  Due to long hold times on the telephone, sending your provider a message by [facility] may be a faster and more efficient way to get a response.  Please allow 48 business hours for a response.  Please remember that this is for non-urgent requests.  _______________________________________________________

## 2023-06-15 ENCOUNTER — Encounter: Payer: Self-pay | Admitting: Internal Medicine

## 2023-06-15 NOTE — Progress Notes (Signed)
HISTORY OF PRESENT ILLNESS:  Cole [name] is a 58 y.o. male with a history of ileal Crohn's disease status post ileocecectomy in [facility] [place] when he presented with suppurative appendicitis with associated with phlegmon.  The pathology revealed changes consistent with Crohn's disease.  I saw the patient August 2015 for chronic diarrhea and the need for colonoscopy.  Colonoscopy was performed October 2015 and revealed an ulcerated and stenotic surgical anastomosis.  Biopsies were consistent with inflammatory bowel disease.  Subsequent small bowel follow-through revealed changes consistent with Crohn's disease involving the distalmost ileum for 5 cm.  Subsequent CT revealed some inflammation at the level of the anastomosis.  He has followed up suboptimally.  In August 2017 he opted for ongoing surveillance as opposed to more aggressive medical therapy.  In December 2018 he was seen for iron deficiency anemia with a hemoglobin of 9.7.  Upper endoscopy was performed December 2018.  This was essentially normal except for incidental esophageal ring.  Then underwent complete colonoscopy June 08, 2017 and was found to have stenosis and ulceration at the level of the anastomosis.  Unable to intubate the ileum.  He was placed on iron.  He responded well to iron therapy.  He was then seen June 2019 as he continued to decline maintenance medical therapy.  Subsequently seen in the office April 2023 after emergency room visit for right-sided abdominal pain.  He was found to have inflammation of the ileum on imaging.  Colonoscopy was performed September 10, 2021 revealed significant ileal disease.  He was prescribed a course of prednisone for 6 weeks.  He was to follow-up in the office in 4 to 6 weeks, but did not.  He was last seen June 11, 2022 reported that his previous issues with pain resolved after treatment with prednisone.  He reported problems with diarrhea at least once per week.  Stools were  often loose or soft.  Continued to to smoke.  At the time of his last visit he expressed some interest in maintenance therapy.  Blood work included normal TPMT activity, negative hepatitis serologies, and negative QuantiFERON gold testing.  COLONOSCOPY was performed July 31, 2022.  The ileum was intubated and had revealed multiple ulcers as well as luminal narrowing.  Ileal biopsies revealed benign ulceration.  He was to follow-up in the office in a few weeks but did not.  At the time of his last office visit he was placed on Colestid 1 g twice daily.  This has helped his diarrhea significantly.  He is pleased.  He continues to smoke.  He denies abdominal pain.  Has gained 10 pounds.  He denies bleeding or other issues.  He is not interested in additional therapies at this time.  Review of blood work from April 01, 2023 shows normal comprehensive metabolic panel.  Normal CBC with hemoglobin 14.9.  Hemoglobin A1c 5.6.  He request a refill of Colestid    REVIEW OF SYSTEMS:  All non-GI ROS negative except for arthritis  Past Medical History:  Diagnosis Date   ADD (attention deficit disorder)    Allergy    Appendicitis    Arthritis    ASHD (arteriosclerotic heart disease)    Cancer (HCC)    Colitis    Crohn's disease (HCC)    Eczema    Emphysema of lung (HCC)    GERD (gastroesophageal reflux disease)    Hyperlipidemia    Hypertension    Left renal mass    Morbid obesity (HCC)  Myocardial infarction [facility])    status post RCA stenting in [place] 2011   Positive for macroalbuminuria    Renal cell carcinoma, left (HCC) 08/22/2019   Dr. Isabel Caprice for left renal mass found during crohn's evaluation, had left partial nephrectomy 01/17/2015 and had + renal cell carcinoma     Past Surgical History:  Procedure Laterality Date   APPENDECTOMY     COLON SURGERY     r/t appendix burstin   CORONARY STENT PLACEMENT     x 1   ELBOW SURGERY     ROBOTIC ASSITED PARTIAL NEPHRECTOMY Left  01/17/2015   Procedure: ROBOTIC ASSITED LAPAROSCOPIC LEFT  PARTIAL  NEPHRECTOMY;  Surgeon: Crist Fat, MD;  Location: [facility];  Service: Urology;  Laterality: Left;    Social History [name]  reports that he has been smoking cigarettes. He has a 30 pack-year smoking history. He has never used smokeless tobacco. He reports current alcohol use. He reports that he does not currently use drugs after having used the following drugs: Marijuana.  family history includes Crohn's disease in his paternal aunt and paternal uncle; Diabetes in his maternal grandmother.  Allergies  Allergen Reactions   Bee Venom Swelling       PHYSICAL EXAMINATION: Vital signs: BP (!) 144/82   Pulse 82   Ht 5\' 9"  (1.753 m)   Wt 246 lb (111.6 kg)   SpO2 93%   BMI 36.33 kg/m   Constitutional: generally well-appearing, no acute distress.  Smells like tobacco. Psychiatric: alert and oriented x3, cooperative Eyes: extraocular movements intact, anicteric, conjunctiva pink Mouth: oral pharynx moist, no lesions Neck: supple no lymphadenopathy Cardiovascular: heart regular rate and rhythm, no murmur Lungs: clear to auscultation bilaterally Abdomen: soft, obese, nontender, nondistended, no obvious ascites, no peritoneal signs, normal bowel sounds, no organomegaly.  Surgical incisions well-healed Rectal: Omitted Extremities: no clubbing, cyanosis, or lower extremity edema bilaterally Skin: no lesions on visible extremities Neuro: No focal deficits.  Cranial nerves intact  ASSESSMENT:   1.  Complicated ileal Crohn's disease with phlegmon status post ileocecectomy elsewhere 2011.  Last colonoscopy March 2024 with ileal disease as described.  Currently asymptomatic. 2.  History of iron deficiency anemia secondary to active Crohn's disease.  Most recent hemoglobin was normal. 3.  Active ileal Crohn's disease on most recent colonoscopy March 2024 4.  Suboptimal medical follow-up.  Continues. 5.  Ongoing  tobacco abuse.  Continues 6.  Bile salt related diarrhea.  Addressed with Colestid 1 g twice daily     PLAN:   1.  The patient is currently not interested in considering treatment of his active Crohn's disease and maintenance therapies.  Discussed the potential complications of untreated disease. 2.  Stop smoking 3.  Prescribe Colestid 1 g p.o. twice daily.  Medication side effects and effects reviewed.  Refilled. 4.  Routine office follow-up 1 year. 5.  Contact the office in the interim as needed. 6.  Resume general medical care with PCP A total time of 40 minutes was spent preparing to see the patient, reviewing the myriad of data, obtaining comprehensive history, performing medically appropriate physical exam, counseling and educating the patient regarding the above listed issues, ordering medication, directing follow-up parameters, and documenting clinical information in the health record

## 2023-07-02 ENCOUNTER — Ambulatory Visit: Payer: Federal, State, Local not specified - PPO | Admitting: Nurse Practitioner

## 2023-08-04 ENCOUNTER — Other Ambulatory Visit: Payer: Self-pay

## 2023-08-04 MED ORDER — EZETIMIBE 10 MG PO TABS: 10.0000 mg | ORAL_TABLET | Freq: Every day | ORAL | 0 refills | Status: DC

## 2023-08-18 DIAGNOSIS — I10 Essential (primary) hypertension: Secondary | ICD-10-CM | POA: Diagnosis not present

## 2023-08-18 DIAGNOSIS — E785 Hyperlipidemia, unspecified: Secondary | ICD-10-CM | POA: Diagnosis not present

## 2023-08-18 DIAGNOSIS — F17200 Nicotine dependence, unspecified, uncomplicated: Secondary | ICD-10-CM | POA: Diagnosis not present

## 2023-08-18 DIAGNOSIS — I2510 Atherosclerotic heart disease of native coronary artery without angina pectoris: Secondary | ICD-10-CM | POA: Diagnosis not present

## 2023-09-11 ENCOUNTER — Other Ambulatory Visit: Payer: Self-pay | Admitting: Cardiovascular Disease

## 2023-10-05 ENCOUNTER — Other Ambulatory Visit: Payer: Self-pay

## 2023-10-05 MED ORDER — EZETIMIBE 10 MG PO TABS: 10.0000 mg | ORAL_TABLET | Freq: Every day | ORAL | 0 refills | Status: AC

## 2023-10-25 DIAGNOSIS — J069 Acute upper respiratory infection, unspecified: Secondary | ICD-10-CM | POA: Diagnosis not present

## 2023-10-25 DIAGNOSIS — R062 Wheezing: Secondary | ICD-10-CM | POA: Diagnosis not present

## 2023-10-29 DIAGNOSIS — L400 Psoriasis vulgaris: Secondary | ICD-10-CM | POA: Diagnosis not present

## 2023-10-29 DIAGNOSIS — L718 Other rosacea: Secondary | ICD-10-CM | POA: Diagnosis not present

## 2024-02-22 ENCOUNTER — Ambulatory Visit: Attending: Cardiovascular Disease | Admitting: Cardiovascular Disease

## 2024-02-22 ENCOUNTER — Encounter: Payer: Self-pay | Admitting: Cardiovascular Disease

## 2024-02-22 VITALS — BP 150/88 | HR 66 | Ht 70.0 in | Wt 242.8 lb

## 2024-02-22 DIAGNOSIS — F17200 Nicotine dependence, unspecified, uncomplicated: Secondary | ICD-10-CM

## 2024-02-22 DIAGNOSIS — I10 Essential (primary) hypertension: Secondary | ICD-10-CM

## 2024-02-22 DIAGNOSIS — I2510 Atherosclerotic heart disease of native coronary artery without angina pectoris: Secondary | ICD-10-CM | POA: Diagnosis not present

## 2024-02-22 DIAGNOSIS — E782 Mixed hyperlipidemia: Secondary | ICD-10-CM | POA: Diagnosis not present

## 2024-02-22 NOTE — Assessment & Plan Note (Signed)
 History of essential hypertension with blood pressure measured today 150/88.  He is on metoprolol .

## 2024-02-22 NOTE — Assessment & Plan Note (Signed)
 History of CAD status post myocardial infarction in 2012 treated with RCA PCI and stenting using a drug-eluting stent by Dr. Collier.  He is been asymptomatic since.

## 2024-02-22 NOTE — Assessment & Plan Note (Signed)
 History of hyperlipidemia on high-dose atorvastatin  and Zetia  not at goal for secondary prevention with lipid profile performed 04/01/2023 revealing a total cholesterol 179, LDL 112 and HDL of 41.  I am going refer him to Pharm.D. to discuss PCSK9.  LDL goal less than 70.

## 2024-02-22 NOTE — Assessment & Plan Note (Signed)
Ongoing tobacco abuse of 1 pack/day recalcitrant to risk factor modification. 

## 2024-02-22 NOTE — Patient Instructions (Signed)
 Thank you for choosing [facility]!     Medication Instructions:  No medication changes were made during today's visit.   A referral has been placed for Pharmacy consult.   *If you need a refill on your cardiac medications before your next appointment, please call your pharmacy*   Lab Work: No labs were ordered during today's visit.  If you have labs (blood work) drawn today and your tests are completely normal, you will receive your results only by: MyChart Message (if you have MyChart) OR A paper copy in the mail If you have any lab test that is abnormal or we need to change your treatment, we will call you to review the results.   Testing/Procedures: No procedures were ordered during today's visit.     Provider:   Dr. Court    Follow-Up: At [facility], you and your health needs are our priority.  As part of our continuing mission to provide you with exceptional heart care, we have created designated Provider Care Teams.  These Care Teams include your primary Cardiologist (physician) and Advanced Practice Providers (APPs -  Physician Assistants and Nurse Practitioners) who all work together to provide you with the care you need, when you need it. We recommend signing up for the patient portal called MyChart.  Sign up information is provided on this After Visit Summary.  MyChart is used to connect with patients for Virtual Visits (Telemedicine).  Patients are able to view lab/test results, encounter notes, upcoming appointments, etc.  Non-urgent messages can be sent to your provider as well.   To learn more about what you can do with MyChart, go to [email or web address].

## 2024-02-22 NOTE — Progress Notes (Unsigned)
 02/22/2024 Cole [name]   1966/01/12  [id number]  Primary Physician Tonita Fallow, MD (Inactive) Primary Cardiologist: Dorn JINNY Lesches MD FACP, [place], [place], [place]  HPI:  Cole [name] is a 58 y.o.   moderately overweight married Caucasian male father of 2 children, grandfather to 3 grandchildren, who I last saw in the office 07/16/2022.  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  He was referred for preoperative clearance before elective partial robotic left nephrectomy scheduled to be performed by Dr. Cam. His primary care physician is Dr. Tonita His cardiac risk factor profile is notable for 35-50-pack-years of tobacco abuse continue to smoke one pack a day, treated hypertension, hyperlipidemia. There is no family history. He did have a myocardial infarction in 2012 and was treated at [facility] by Dr. Collier. He had a DES placed in his RCA radially. He currently denies chest pain or shortness of breath. He is scheduled to have an elective robotic left partial nephrectomy by Dr. Cam at [facility] and was referred here for preoperative clearance.   Since I saw him in the office a year and a half ago he is remained stable.  He denies chest pain or shortness of breath.  He does continue to smoke 1 pack/day.  He is not at goal for secondary prevention on high-dose atorvastatin  and Zetia .   Current Meds  Medication Sig   albuterol  (VENTOLIN  HFA) 108 (90 Base) MCG/ACT inhaler Inhale 1-2 puffs into the lungs every 4 (four) hours as needed for wheezing or shortness of breath.   amphetamine -dextroamphetamine  (ADDERALL) 20 MG tablet Take 1 tablet (20 mg total) by mouth 2 (two) times daily.   aspirin 81 MG chewable tablet Chew by mouth.   atorvastatin  (LIPITOR) 80 MG tablet Take  1 tablet  Daily  for Cholesterol   BREZTRI  AEROSPHERE 160-9-4.8 MCG/ACT AERO INHALE 2 PUFFS BY MOUTH TWICE A DAY IN THE MORNING AND IN THE EVENING   colestipol   (COLESTID ) 1 g tablet Take 1 tablet (1 g total) by mouth 2 (two) times daily.   Cyanocobalamin  (VITAMIN B-12 PO) Take by mouth.   ezetimibe  (ZETIA ) 10 MG tablet Take 1 tablet (10 mg total) by mouth daily.   ferrous sulfate  325 (65 FE) MG tablet Take 1 tablet (325 mg total) by mouth 2 (two) times daily. Patient needs office visit for further refills   metoprolol  tartrate (LOPRESSOR ) 25 MG tablet Take 1 tablet (25 mg total) by mouth 2 (two) times daily.   Multiple Vitamins-Minerals (MULTIVITAMIN WITH MINERALS) tablet Take 1 tablet by mouth daily.   Multiple Vitamins-Minerals (ZINC PO) Take by mouth.   Current Facility-Administered Medications for the 02/22/24 encounter (Office Visit) with Lesches Dorn JINNY, MD  Medication   ipratropium-albuterol  (DUONEB) 0.5-2.5 (3) MG/3ML nebulizer solution 3 mL     Allergies  Allergen Reactions   Bee Venom Swelling    Social History   Socioeconomic History   Marital status: Married    Spouse name: Not on file   Number of children: 2   Years of education: Not on file   Highest education level: Not on file  Occupational History   Occupation: Truck Air traffic controller: [employer]  Tobacco Use   Smoking status: Every Day    Current packs/day: 1.00    Average packs/day: 1 pack/day for 30.0 years (30.0 ttl pk-yrs)    Types: Cigarettes   Smokeless tobacco: Never  Vaping Use  Vaping status: Never Used  Substance and Sexual Activity   Alcohol  use: Yes    Comment: Occasional Beer   Drug use: Not Currently    Types: Marijuana    Comment: used last 1 day ago, h/o cocaine use 20 years ago.   Sexual activity: Yes  Other Topics Concern   Not on file  Social History Narrative   Not on file   Social Drivers of Health   Financial Resource Strain: Low Risk  (03/27/2022)   Received from [facility]   Overall Financial Resource Strain (CARDIA)    Difficulty of Paying Living Expenses: Not hard at all  Food Insecurity: No Food Insecurity  (03/27/2022)   Received from [facility]   Hunger Vital Sign    Within the past 12 months, you worried that your food would run out before you got the money to buy more.: Never true    Within the past 12 months, the food you bought just didn't last and you didn't have money to get more.: Never true  Transportation Needs: No Transportation Needs (03/27/2022)   Received from [facility]    Lack of Transportation (Medical): No    Lack of Transportation (Non-Medical): No  Physical Activity: Not on file  Stress: Not on file  Social Connections: Not on file  Intimate Partner Violence: Not on file     Review of Systems: General: negative for chills, fever, night sweats or weight changes.  Cardiovascular: negative for chest pain, dyspnea on exertion, edema, orthopnea, palpitations, paroxysmal nocturnal dyspnea or shortness of breath Dermatological: negative for rash Respiratory: negative for cough or wheezing Urologic: negative for hematuria Abdominal: negative for nausea, vomiting, diarrhea, bright red blood per rectum, melena, or hematemesis Neurologic: negative for visual changes, syncope, or dizziness All other systems reviewed and are otherwise negative except as noted above.    Blood pressure (!) 150/88, pulse 66, height 5' 10 (1.778 m), weight 242 lb 12.8 oz (110.1 kg), SpO2 93%.  General appearance: alert and no distress Neck: no adenopathy, no carotid bruit, no JVD, supple, symmetrical, trachea midline, and thyroid  not enlarged, symmetric, no tenderness/mass/nodules Lungs: clear to auscultation bilaterally Heart: regular rate and rhythm, S1, S2 normal, no murmur, click, rub or gallop Extremities: extremities normal, atraumatic, no cyanosis or edema Pulses: 2+ and symmetric Skin: Skin color, texture, turgor normal. No rashes or lesions Neurologic: Grossly normal  EKG EKG Interpretation Date/Time:  Monday February 22 2024 15:06:45  EDT Ventricular Rate:  66 PR Interval:  212 QRS Duration:  110 QT Interval:  402 QTC Calculation: 421 R Axis:   10  Text Interpretation: Sinus rhythm with sinus arrhythmia with 1st degree A-V block Incomplete right bundle branch block Cannot rule out Inferior infarct , age undetermined When compared with ECG of 26-Mar-2022 18:05, PREVIOUS ECG IS PRESENT Confirmed by Court Carrier [phone]) on 02/22/2024 3:45:58 PM    ASSESSMENT AND PLAN:   Hyperlipidemia History of hyperlipidemia on high-dose atorvastatin  and Zetia  not at goal for secondary prevention with lipid profile performed 04/01/2023 revealing a total cholesterol 179, LDL 112 and HDL of 41.  I am going refer him to Pharm.D. to discuss PCSK9.  LDL goal less than 70.  Hypertension History of essential hypertension with blood pressure measured today 150/88.  He is on metoprolol .  Smoker Ongoing tobacco abuse of 1 pack/day recalcitrant to risk factor modification.  Coronary artery disease History of CAD status post myocardial infarction in 2012 treated  with RCA PCI and stenting using a drug-eluting stent by Dr. Collier.  He is been asymptomatic since.     Dorn DOROTHA Lesches MD FACP,FACC,FAHA, [facility] 02/22/2024 3:54 PM

## 2024-02-26 DIAGNOSIS — I10 Essential (primary) hypertension: Secondary | ICD-10-CM | POA: Diagnosis not present

## 2024-02-26 DIAGNOSIS — Z0000 Encounter for general adult medical examination without abnormal findings: Secondary | ICD-10-CM | POA: Diagnosis not present

## 2024-02-26 DIAGNOSIS — Z131 Encounter for screening for diabetes mellitus: Secondary | ICD-10-CM | POA: Diagnosis not present

## 2024-02-26 DIAGNOSIS — Z23 Encounter for immunization: Secondary | ICD-10-CM | POA: Diagnosis not present

## 2024-02-26 DIAGNOSIS — Z125 Encounter for screening for malignant neoplasm of prostate: Secondary | ICD-10-CM | POA: Diagnosis not present

## 2024-02-26 DIAGNOSIS — K5090 Crohn's disease, unspecified, without complications: Secondary | ICD-10-CM | POA: Diagnosis not present

## 2024-02-26 DIAGNOSIS — E785 Hyperlipidemia, unspecified: Secondary | ICD-10-CM | POA: Diagnosis not present

## 2024-02-26 DIAGNOSIS — F988 Other specified behavioral and emotional disorders with onset usually occurring in childhood and adolescence: Secondary | ICD-10-CM | POA: Diagnosis not present

## 2024-02-26 DIAGNOSIS — Z13220 Encounter for screening for lipoid disorders: Secondary | ICD-10-CM | POA: Diagnosis not present

## 2024-02-29 ENCOUNTER — Other Ambulatory Visit: Payer: Self-pay | Admitting: Physician Assistant

## 2024-02-29 DIAGNOSIS — Z122 Encounter for screening for malignant neoplasm of respiratory organs: Secondary | ICD-10-CM

## 2024-03-02 ENCOUNTER — Ambulatory Visit: Payer: Self-pay | Admitting: Cardiovascular Disease

## 2024-03-04 ENCOUNTER — Ambulatory Visit
Admission: RE | Admit: 2024-03-04 | Discharge: 2024-03-04 | Disposition: A | Discharge status: Home / Self Care | Source: Ambulatory Visit | Attending: Physician Assistant | Admitting: Physician Assistant

## 2024-03-04 DIAGNOSIS — Z122 Encounter for screening for malignant neoplasm of respiratory organs: Secondary | ICD-10-CM

## 2024-03-04 DIAGNOSIS — Z87891 Personal history of nicotine dependence: Secondary | ICD-10-CM | POA: Diagnosis not present

## 2024-03-25 ENCOUNTER — Encounter: Payer: Federal, State, Local not specified - PPO | Admitting: Nurse Practitioner

## 2024-03-28 ENCOUNTER — Other Ambulatory Visit (HOSPITAL_COMMUNITY): Payer: Self-pay

## 2024-03-28 ENCOUNTER — Telehealth: Payer: Self-pay | Admitting: Pharmacist

## 2024-03-28 ENCOUNTER — Ambulatory Visit: Admitting: Pharmacist

## 2024-03-28 DIAGNOSIS — E782 Mixed hyperlipidemia: Secondary | ICD-10-CM | POA: Diagnosis not present

## 2024-03-28 MED ORDER — ATORVASTATIN CALCIUM 80 MG PO TABS
80.0000 mg | ORAL_TABLET | Freq: Every day | ORAL | 3 refills | Status: AC
  Filled 2024-03-28: qty 90, 90d supply, fill #0

## 2024-03-28 NOTE — Patient Instructions (Addendum)
 Your Results:             Your most recent labs Goal  Total Cholesterol 177 < 200  Triglycerides 178 < 150  HDL (happy/good cholesterol) 33 > 40  LDL (lousy/bad cholesterol 112 < 55   Medication changes: Continued taking ezetimibe  10 mg daily. Start atorvastatin  (Lipitor) 80 mg daily.    Atorvastatin  works to decrease the amount of bad cholesterol that your body naturally produces in the liver. It also has anti-inflammatory properties and lowers the risk of plaque rupture. These effects lower your risk of having a heart attack and stroke.   Lab orders: We want to repeat labs after 2-3 months.  We will send you a lab order to remind you once we get closer to that time.

## 2024-03-28 NOTE — Telephone Encounter (Signed)
 Pharmacy Patient Advocate Encounter   Received notification from Physician's Office that prior authorization for REPATHA is required/requested.   Insurance verification completed.   The patient is insured through [facility].   Per test claim: PA required; PA submitted to above mentioned insurance via Latent Key/confirmation #/EOC [id number] Status is pending

## 2024-03-28 NOTE — Assessment & Plan Note (Addendum)
 Assessment:  LDL goal: < 55 mg/dl last LDLc 887 mg/dl (89/89/74) Tolerates Zetia  well without any side effects  Previously tolerated atorvastatin  80 mg without adverse effects  High-intensity statin in addition to ezetimibe  likely to achieve LDL goal.   Plan: Continue taking current medications (ezetimibe ) Will re-start atorvastatin  80 mg since patient does not report any adverse effects with previous use Lipid lab due in 2-3 months after starting atorvastatin 

## 2024-03-28 NOTE — Progress Notes (Signed)
 Patient ID: Cole [name]                 DOB: May 22, 1965                    MRN: [id number]      HPI: Cole [name] is a 58 y.o. male patient referred to lipid clinic by [facility] PMH is significant for CAD, HTN, HLD, ADD, Crohn's disease, tobacco use.   high-dose atorvastatin  and Zetia  not at goal for secondary prevention with lipid profile performed 04/01/2023 revealing a total cholesterol 179, LDL 112 and HDL of 41.  However the fill hx indicates patient not taking Statin and non adherence to Zetia .   Reviewed options for lowering LDL cholesterol, including ezetimibe , PCSK-9 inhibitors, bempedoic acid and inclisiran.  Discussed mechanisms of action, dosing, side effects and potential decreases in LDL cholesterol.  Also reviewed cost information and potential options for patient assistance.  Current Medications: Atorvastatin  80 mg daily and Zetia  10 mg daily  Intolerances:  Risk Factors: premature  CAD, HTN, HLD, ADD, tobacco use, hx of MI s/p DES in RCA LDL goal: <55 mg /dl Last lab: 89/897974 TC 177, TG 178, LDLc 112 HDL 33   Diet:   Exercise:   Family History:   Social History:  Smoking:  Alcohol :   Labs:  Lipid Panel     Component Value Date/Time   CHOL 179 04/01/2023 1538   TRIG 147 04/01/2023 1538   HDL 41 04/01/2023 1538   CHOLHDL 4.4 04/01/2023 1538   VLDL 23 10/01/2016 0946   LDLCALC 112 (H) 04/01/2023 1538    Past Medical History:  Diagnosis Date   ADD (attention deficit disorder)    Allergy    Appendicitis    Arthritis    ASHD (arteriosclerotic heart disease)    Cancer (HCC)    Colitis    Crohn's disease (HCC)    Eczema    Emphysema of lung (HCC)    GERD (gastroesophageal reflux disease)    Hyperlipidemia    Hypertension    Left renal mass    Morbid obesity (HCC)    Myocardial infarction (HCC)    status post RCA stenting in [place] 2011   Positive for macroalbuminuria    Renal cell carcinoma, left (HCC) 08/22/2019   Dr. Alline for left  renal mass found during crohn's evaluation, had left partial nephrectomy 01/17/2015 and had + renal cell carcinoma     Current Outpatient Medications on File Prior to Visit  Medication Sig Dispense Refill   albuterol  (VENTOLIN  HFA) 108 (90 Base) MCG/ACT inhaler Inhale 1-2 puffs into the lungs every 4 (four) hours as needed for wheezing or shortness of breath. 1 each 2   amphetamine -dextroamphetamine  (ADDERALL) 20 MG tablet Take 1 tablet (20 mg total) by mouth 2 (two) times daily. 60 tablet 0   aspirin 81 MG chewable tablet Chew by mouth.     atorvastatin  (LIPITOR) 80 MG tablet Take  1 tablet  Daily  for Cholesterol 90 tablet 0   BREZTRI  AEROSPHERE 160-9-4.8 MCG/ACT AERO INHALE 2 PUFFS BY MOUTH TWICE A DAY IN THE MORNING AND IN THE EVENING 10.7 g 3   colestipol  (COLESTID ) 1 g tablet Take 1 tablet (1 g total) by mouth 2 (two) times daily. 180 tablet 3   Cyanocobalamin  (VITAMIN B-12 PO) Take by mouth.     ezetimibe  (ZETIA ) 10 MG tablet Take 1 tablet (10 mg total) by mouth daily. 15 tablet 0   ferrous sulfate  325 (  65 FE) MG tablet Take 1 tablet (325 mg total) by mouth 2 (two) times daily. Patient needs office visit for further refills 60 tablet 2   metoprolol  tartrate (LOPRESSOR ) 25 MG tablet Take 1 tablet (25 mg total) by mouth 2 (two) times daily. 180 tablet 1   Multiple Vitamins-Minerals (MULTIVITAMIN WITH MINERALS) tablet Take 1 tablet by mouth daily.     Multiple Vitamins-Minerals (ZINC PO) Take by mouth.     Current Facility-Administered Medications on File Prior to Visit  Medication Dose Route Frequency Provider Last Rate Last Admin   ipratropium-albuterol  (DUONEB) 0.5-2.5 (3) MG/3ML nebulizer solution 3 mL  3 mL Nebulization Once Cranford, Tonya, NP        Allergies  Allergen Reactions   Bee Venom Swelling    Assessment/Plan:  1. Hyperlipidemia -  No problems updated. No problem-specific Assessment & Plan notes found for this encounter.    Thank you,  Robbi Blanch,  Pharm.D [place] [facility] BIRCH. [facility] [address], [place], [place] [address] Phone: [phone]; Fax: [phone]

## 2024-03-28 NOTE — Progress Notes (Signed)
 Patient ID: Cole [name]                 DOB: 11-24-1965                    MRN: [id number]      HPI: Cole [name] is a 58 y.o. male patient referred to lipid clinic by [facility] PMH is significant for CAD, HTN, HLD, ADD, Crohn's disease, tobacco use.   High-dose atorvastatin  and Zetia  not at goal for secondary prevention with lipid profile performed 04/01/2023 revealing a total cholesterol 179, LDL 112 and HDL of 41.   Fill hx indicates patient not taking statin and non adherence to Zetia . Patient stopped taking atorvastatin  when started on Zetia , does not report any side effects from atorvastatin . Patient states statins can increase heart risk. Informed patients of benefits of statin in reduction of cardiovascular risk. Reports adherence to ezetimibe .   Discussed benefit of designated exercise time in addition to work. Also emphasized importance of quitting or reduction in smoking on cardiovascular health.   Current Medications: Zetia  10 mg daily.  Intolerances: n/a Risk Factors: Premature CAD, HTN, HLD, ADD, tobacco use, hx of MI s/p DES in RCA LDL goal: <55 mg /dl Last oja:89/89/7974 TC 177, TG 178, LDLc 112, HDL 33 (non-fasting lab)  Diet: Inconsistent diet. Frequently cans and freezes fresh foods from home garden.   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 On feet most of the day for this. Does not report exercise outside of work.   Family History:  Family History  Problem Relation Age of Onset   Diabetes Maternal Grandmother    Crohn's disease Paternal Aunt    Crohn's disease Paternal Uncle    Colon cancer Neg Hx    Esophageal cancer Neg Hx    Rectal cancer Neg Hx    Stomach cancer Neg Hx    Pancreatic cancer Neg Hx    Liver cancer Neg Hx      Social History:  Smoking: 1 PPD Alcohol : One beer 1 - 2 times per week  Labs:  Lipid Panel     Component Value Date/Time   CHOL 179 04/01/2023 1538   TRIG 147 04/01/2023 1538   HDL 41  04/01/2023 1538   CHOLHDL 4.4 04/01/2023 1538   VLDL 23 10/01/2016 0946   LDLCALC 112 (H) 04/01/2023 1538    Past Medical History:  Diagnosis Date   ADD (attention deficit disorder)    Allergy    Appendicitis    Arthritis    ASHD (arteriosclerotic heart disease)    Cancer (HCC)    Colitis    Crohn's disease (HCC)    Eczema    Emphysema of lung (HCC)    GERD (gastroesophageal reflux disease)    Hyperlipidemia    Hypertension    Left renal mass    Morbid obesity (HCC)    Myocardial infarction (HCC)    status post RCA stenting in [place] 2011   Positive for macroalbuminuria    Renal cell carcinoma, left (HCC) 08/22/2019   Dr. Alline for left renal mass found during crohn's evaluation, had left partial nephrectomy 01/17/2015 and had + renal cell carcinoma     Current Outpatient Medications on File Prior to Visit  Medication Sig Dispense Refill   albuterol  (VENTOLIN  HFA) 108 (90 Base) MCG/ACT inhaler Inhale 1-2 puffs into the lungs every 4 (four) hours as needed for wheezing or shortness of breath. 1 each 2   amphetamine -dextroamphetamine  (ADDERALL) 20  MG tablet Take 1 tablet (20 mg total) by mouth 2 (two) times daily. 60 tablet 0   aspirin 81 MG chewable tablet Chew by mouth.     BREZTRI  AEROSPHERE 160-9-4.8 MCG/ACT AERO INHALE 2 PUFFS BY MOUTH TWICE A DAY IN THE MORNING AND IN THE EVENING 10.7 g 3   colestipol  (COLESTID ) 1 g tablet Take 1 tablet (1 g total) by mouth 2 (two) times daily. 180 tablet 3   Cyanocobalamin  (VITAMIN B-12 PO) Take by mouth.     ezetimibe  (ZETIA ) 10 MG tablet Take 1 tablet (10 mg total) by mouth daily. 15 tablet 0   ferrous sulfate  325 (65 FE) MG tablet Take 1 tablet (325 mg total) by mouth 2 (two) times daily. Patient needs office visit for further refills 60 tablet 2   metoprolol  tartrate (LOPRESSOR ) 25 MG tablet Take 1 tablet (25 mg total) by mouth 2 (two) times daily. 180 tablet 1   Multiple Vitamins-Minerals (MULTIVITAMIN WITH MINERALS) tablet  Take 1 tablet by mouth daily.     Multiple Vitamins-Minerals (ZINC PO) Take by mouth.     Current Facility-Administered Medications on File Prior to Visit  Medication Dose Route Frequency Provider Last Rate Last Admin   ipratropium-albuterol  (DUONEB) 0.5-2.5 (3) MG/3ML nebulizer solution 3 mL  3 mL Nebulization Once Cranford, Tonya, NP        Allergies  Allergen Reactions   Bee Venom Swelling    Assessment/Plan:  1. Hyperlipidemia -  Problem  Hyperlipidemia   Current Medications: Zetia  10 mg daily.  Intolerances: n/a Risk Factors: Premature CAD, HTN, HLD, ADD, tobacco use, hx of MI s/p DES in RCA LDL goal: <55 mg /dl Last oja:89/89/7974 TC 177, TG 178, LDLc 112, HDL 33 (non-fasting lab)    Hyperlipidemia Assessment:  LDL goal: < 55 mg/dl last LDLc 887 mg/dl (89/89/74) Tolerates Zetia  well without any side effects  Previously tolerated atorvastatin  80 mg without adverse effects  High-intensity statin in addition to ezetimibe  likely to achieve LDL goal.   Plan: Continue taking current medications (ezetimibe ) Will re-start atorvastatin  80 mg since patient does not report any adverse effects with previous use Lipid lab due in 2-3 months after starting atorvastatin    Thank you,  Noah Jordan, Pharm.D Candidate  Robbi Blanch, [address]D [place] Elsp[facility] BIRCH. [facility] [address], [place], [place] [address] Phone: [phone]; Fax: [phone]

## 2024-03-29 NOTE — Telephone Encounter (Signed)
 PA form suggests PA will not be approved without additional information. RPH made aware and agreeable to hold off on Repatha PA for now. Pt will continue high intensity statins per [facility].   PA D/C'D for now. Per RPH hold off on Repatha, pt to resume statin therapy.

## 2024-03-31 ENCOUNTER — Encounter: Payer: Federal, State, Local not specified - PPO | Admitting: Nurse Practitioner

## 2024-04-26 ENCOUNTER — Telehealth: Payer: Self-pay

## 2024-06-20 ENCOUNTER — Other Ambulatory Visit (HOSPITAL_COMMUNITY): Payer: Self-pay
